# Patient Record
Sex: Male | Born: 1950 | Race: White | Hispanic: No | Marital: Married | Smoking: Former smoker
Health system: Southern US, Community
[De-identification: ages and names within clinical notes are randomized; demographics above are authoritative.]

## PROBLEM LIST (undated history)

## (undated) DIAGNOSIS — E785 Hyperlipidemia, unspecified: Secondary | ICD-10-CM

## (undated) DIAGNOSIS — H269 Unspecified cataract: Secondary | ICD-10-CM

## (undated) DIAGNOSIS — R002 Palpitations: Secondary | ICD-10-CM

## (undated) DIAGNOSIS — I10 Essential (primary) hypertension: Secondary | ICD-10-CM

## (undated) DIAGNOSIS — U071 COVID-19: Secondary | ICD-10-CM

## (undated) DIAGNOSIS — B029 Zoster without complications: Secondary | ICD-10-CM

## (undated) HISTORY — DX: COVID-19: U07.1

## (undated) HISTORY — DX: Essential (primary) hypertension: I10

## (undated) HISTORY — DX: Unspecified cataract: H26.9

## (undated) HISTORY — DX: Hyperlipidemia, unspecified: E78.5

## (undated) HISTORY — PX: CATARACT EXTRACTION: SUR2

## (undated) HISTORY — DX: Zoster without complications: B02.9

## (undated) HISTORY — DX: Palpitations: R00.2

---

## 2007-06-13 ENCOUNTER — Ambulatory Visit: Payer: Self-pay | Admitting: Family Medicine

## 2007-06-18 ENCOUNTER — Ambulatory Visit: Payer: Self-pay | Admitting: Family Medicine

## 2008-08-07 ENCOUNTER — Emergency Department: Payer: Self-pay | Admitting: Emergency Medicine

## 2014-05-28 ENCOUNTER — Observation Stay: Payer: Self-pay | Admitting: Student

## 2014-05-28 LAB — COMPREHENSIVE METABOLIC PANEL
ANION GAP: 8 (ref 7–16)
Albumin: 4 g/dL (ref 3.4–5.0)
Alkaline Phosphatase: 50 U/L
BUN: 13 mg/dL (ref 7–18)
Bilirubin,Total: 0.4 mg/dL (ref 0.2–1.0)
CALCIUM: 8.6 mg/dL (ref 8.5–10.1)
CHLORIDE: 106 mmol/L (ref 98–107)
CREATININE: 1.13 mg/dL (ref 0.60–1.30)
Co2: 25 mmol/L (ref 21–32)
EGFR (Non-African Amer.): >60
GLUCOSE: 115 mg/dL — AB (ref 65–99)
Osmolality: 279 (ref 275–301)
POTASSIUM: 4.4 mmol/L (ref 3.5–5.1)
SGOT(AST): 16 U/L (ref 15–37)
SGPT (ALT): 28 U/L
Sodium: 139 mmol/L (ref 136–145)
TOTAL PROTEIN: 7 g/dL (ref 6.4–8.2)

## 2014-05-28 LAB — APTT: ACTIVATED PTT: 29.2 s (ref 23.6–35.9)

## 2014-05-28 LAB — CBC
HCT: 43.2 % (ref 40.0–52.0)
HGB: 14 g/dL (ref 13.0–18.0)
MCH: 29.9 pg (ref 26.0–34.0)
MCHC: 32.4 g/dL (ref 32.0–36.0)
MCV: 92 fL (ref 80–100)
PLATELETS: 249 10*3/uL (ref 150–440)
RBC: 4.69 10*6/uL (ref 4.40–5.90)
RDW: 12.8 % (ref 11.5–14.5)
WBC: 6.8 10*3/uL (ref 3.8–10.6)

## 2014-05-28 LAB — PROTIME-INR
INR: 1.1
PROTHROMBIN TIME: 13.9 s (ref 11.5–14.7)

## 2014-05-28 LAB — TROPONIN I

## 2014-05-29 DIAGNOSIS — I361 Nonrheumatic tricuspid (valve) insufficiency: Secondary | ICD-10-CM

## 2014-09-12 NOTE — H&P (Signed)
PATIENT NAME:  Pedro, Swanson MR#:  [id number] DATE OF BIRTH:  1950-07-19  DATE OF ADMISSION:  05/28/2014  PRIMARY CARE PHYSICIAN:  Dr. Rosanna Swanson.   EMERGENCY ROOM PHYSICIAN:  Dr. Edd Swanson.    CHIEF COMPLAINT:  Dizziness, some slurred speech.   HISTORY OF PRESENTING ILLNESS:  A 64 year old Caucasian male patient with history of hypertension, hyperlipidemia, past tobacco abuse, presents to the Emergency Room complaining of acute onset of dizziness with room spinning sensation along with some slurred speech. The patient woke up early in the morning at 4:30 and started having significant dizziness. There were no aggravating or relieving factors, but he did feel better when he laid down and closed to his eyes. The patient tried to walk, had to hold onto his walls or railings on the site. Did not have any fall. Had some blurred vision which has resolved. His dizziness is resolved at this point. The patient waited to see if his symptoms would get better, but presented to the Emergency Room after his symptoms did not improve at 9:30 for over 5 hours. His friend noticed that the patient had slurred speech over the phone when he called him for help. He does not have any focal numbness or weakness. No dysphagia. No hearing loss. Has had some ringing in his ears for a while.   Presently the patient feels back to baseline other than feeling a little lightheaded, some mild headache. He had some nausea, but no vomiting. No chest pain, palpitations, abdominal pain, incontinence.   PAST MEDICAL HISTORY:  1.  Hypertension.  2.  Hyperlipidemia.  3.  Past tobacco abuse, quit 3 years back.   FAMILY HISTORY: Her parents were healthy. Mother had hip fracture.   SOCIAL HISTORY: The patient smoked for a long time, but quit 3 years back. He drinks a Bloody Mary and 2-3 beers a day. Did not drink any more alcohol or no alcohol at night yesterday.   CODE STATUS: Full code.   ALLERGIES: No known drug allergies.   HOME  MEDICATIONS:  1. Simvastatin 40 mg daily.  2. Amlodipine 5 mg daily.  3. Valsartan 320 mg daily.   REVIEW OF SYSTEMS:    CONSTITUTIONAL: Complains of some fatigue.  EYES:  Had some blurred vision, but no pain or redness.  EARS, NOSE, AND THROAT: Has some on and off tinnitus and dizziness.  CARDIOVASCULAR: No chest pain, orthopnea.  RESPIRATORY: No shortness of breath, cough.  GASTROINTESTINAL: Had some nausea but no vomiting, diarrhea, abdominal pain.   GENITOURINARY:  No dysuria, hematuria, frequency.  ENDOCRINE: No polyuria, nocturia, thyroid problems.  HEMATOLOGIC AND LYMPHATIC: No anemia, easy bruising, bleeding.  INTEGUMENTARY: No acne, rash, lesion.  MUSCULOSKELETAL: No back pain, arthritis.  NEUROLOGIC:  Has some dizziness, but no focal numbness, weakness. Had some slurred speech earlier.   PHYSICAL EXAMINATION:  VITAL SIGNS: Temperature 98.7, pulse of 89, blood pressure 129/94, saturating 100% on room air.  GENERAL: Obese Caucasian male patient sitting at the side of the bed, overall seems comfortable, conversational.  PSYCHIATRIC: Alert and oriented x 3, pleasant.   HEENT:  Atraumatic, normocephalic.  Oral mucosa moist and pink. External ears and nose normal.  No pallor. No icterus. Pupils bilaterally equal and reactive to light.  NECK: Supple. No thyromegaly. No palpable lymph nodes. Trachea midline. No carotid bruit, JVD.  CARDIOVASCULAR: S1, S2, without any murmurs. Peripheral pulses 2 +. Has 1 + edema.  RESPIRATORY: Normal work of breathing. Clear to auscultation on both sides.  GASTROINTESTINAL:  Soft abdomen, nontender. Bowel sounds present. No hepatosplenomegaly palpable.  GENITOURINARY: No CVA tenderness or bladder distention.  SKIN: Warm and dry. No petechiae, rash, ulcers.  MUSCULOSKELETAL: No joint swelling, redness, effusion of large joints. Normal muscle tone.   NEUROLOGIC:  Motor strength 5/5 in upper and lower extremities. Sensation is intact all over. Gait  normal.    LABORATORY STUDIES: Troponin less than 0.02. LFTs normal. BUN 13, creatinine 1.13 with sodium 130 and potassium 4.4. WBC 6.8, hemoglobin 14, platelets of 249,000. INR 1.1.   EKG shows normal sinus rhythm, nothing acute.   CT scan of the head shows mild atrophy, no acute stroke, mass.   Chest x-ray is normal.   ASSESSMENT AND PLAN:  1.  Acute dizziness. This sounds typical for vertigo in this patient. But there is some concern regarding some slurred speech although this is not confirmed. His blurred vision was transient and happened mainly during the room spinning sensation, but will need to rule out if this is a transient ischemic attack or stroke. We will admit the patient under observation, put him on a telemetry floor. Start him on aspirin and statin. Get an MRI of the brain along with carotid Dopplers and echocardiogram. We will also add meclizine p.r.n. If the MRI is normal we will treat this as vertigo. Consult physical therapy. Fall precautions.  2.  Hypertension. Continue medications.  3.  Deep vein thrombosis prophylaxis with Lovenox.   CODE STATUS: Full code.   TIME SPENT TODAY ON THIS CASE: 40 minutes.     ____________________________ Pedro Alf Darika Ildefonso, MD srs:bu D: 05/28/2014 13:37:30 ET T: 05/28/2014 14:00:20 ET JOB#: [id number]  cc: Pedro Heimlich R. Daveah Varone, MD, <Dictator> Pedro L. Pedro Randy, MD Pedro Carp MD ELECTRONICALLY SIGNED 05/30/2014 18:47

## 2014-09-12 NOTE — Discharge Summary (Signed)
PATIENT NAME:  Pedro Swanson, Pedro Swanson MR#:  [id number] DATE OF BIRTH:  1950-06-18  DATE OF ADMISSION:  05/28/2014 DATE OF DISCHARGE:  05/29/2014  ADMITTING PHYSICIAN: Srikar R. Sudini, MD   DISCHARGING PHYSICIAN: Gladstone Lighter, MD   PRIMARY CARE PHYSICIAN: Richard L. Rosanna Randy, MD   DISCHARGE DIAGNOSES:  1.  Dizziness, likely vertigo from inner ear issues/benign positional vertigo.  2.  Hypertension.  3.  Hyperlipidemia.   DISCHARGE HOME MEDICATIONS:  1.  Meclizine 25 mg p.o. q. 8 hours p.r.n. for dizziness.  2.  Simvastatin 40 mg p.o. daily.  3.  Norvasc 5 mg p.o. daily.  4.  Valsartan 320 mg p.o. daily.    DISCHARGE DIET: Low-sodium diet.   DISCHARGE ACTIVITY: As tolerated.   FOLLOWUP INSTRUCTIONS:  ENT followup with Dr. Tami Ribas in 1 week.  2. PCP followup in 2 weeks.   LABORATORY DATA AND IMAGING STUDIES PRIOR TO DISCHARGE: WBC 6.8, hemoglobin 14.0, hematocrit 43.2, platelet count 249,000.   Sodium 139, potassium 4.4, chloride 106, bicarbonate 25, BUN 13 creatinine 1.1, glucose 111, and calcium of 8.6.   ALT 28, AST 16, alkaline phosphatase 50, total bilirubin 0.4, albumin of 4.0.   CT of the head without contrast showing mild diffuse cortical atrophy. No acute intracranial abnormality. Chest x-ray showing no acute cardiopulmonary process. Carotid Doppler showing mild internal carotid artery plaque bilaterally but less than 50% stenosis.   MRI of the brain without contrast showing atrophy, small vessel ischemic changes. No acute intracranial findings.  BRIEF HOSPITAL COURSE: Pedro Swanson is a 64 year old Caucasian male with past medical history significant for hypertension, hyperlipidemia, very healthy, never been hospitalized before, admitted to the hospital secondary to sudden onset of vertigo.  1.  Vertigo: He had significant dizziness that started yesterday morning. He had a similar episode about a year ago, which self-resolved at the time. He was admitted to make sure this  was not a cerebellar or posterior brain infarct. MRI of the brain negative. Dopplers did not show any significant stenosis. Echo is pending at this time. The patient does have some nystagmus. His symptoms appear mostly position-related; could be an inner ear problem. Started on meclizine with some improvement and is being discharged on meclizine with outpatient ENT followup. Dr. Tamala Swanson was briefly contacted over the phone. He had not had a chance to look over the patient's MRI or anything, but based on his symptoms, MRI findings, he recommended ENT followup as well. Continue his statin at this time.  2.  Hypertension. His home medications are being continued.  3.  Hyperlipidemia, on statin.  4.  His course has been otherwise uneventful in the hospital.   DISCHARGE CONDITION: Stable.   DISCHARGE DISPOSITION: Home.   TIME SPENT ON DISCHARGE: 40 minutes.    ____________________________ Gladstone Lighter, MD rk:ts D: 05/29/2014 12:44:00 ET T: 05/29/2014 23:50:50 ET JOB#: [id number]  cc: Gladstone Lighter, MD, <Dictator> Richard L. Rosanna Randy, MD Gladstone Lighter MD ELECTRONICALLY SIGNED 05/31/2014 16:18

## 2014-10-04 LAB — LIPID PANEL
Cholesterol: 172 mg/dL (ref 0–200)
HDL: 59 mg/dL (ref 35–70)
LDL CALC: 94 mg/dL
LDl/HDL Ratio: 1.6
Triglycerides: 94 mg/dL (ref 40–160)

## 2014-10-04 LAB — TSH: TSH: 1.76 u[IU]/mL (ref <=5.90)

## 2015-01-18 ENCOUNTER — Encounter: Payer: Self-pay | Admitting: Family Medicine

## 2015-01-18 ENCOUNTER — Ambulatory Visit (INDEPENDENT_AMBULATORY_CARE_PROVIDER_SITE_OTHER): Payer: No Typology Code available for payment source | Admitting: Family Medicine

## 2015-01-18 VITALS — BP 122/62 | HR 72 | Resp 16 | Ht 69.0 in | Wt 178.0 lb

## 2015-01-18 DIAGNOSIS — K219 Gastro-esophageal reflux disease without esophagitis: Secondary | ICD-10-CM

## 2015-01-18 DIAGNOSIS — E559 Vitamin D deficiency, unspecified: Secondary | ICD-10-CM | POA: Insufficient documentation

## 2015-01-18 DIAGNOSIS — A048 Other specified bacterial intestinal infections: Secondary | ICD-10-CM

## 2015-01-18 DIAGNOSIS — I10 Essential (primary) hypertension: Secondary | ICD-10-CM | POA: Insufficient documentation

## 2015-01-18 DIAGNOSIS — H9110 Presbycusis, unspecified ear: Secondary | ICD-10-CM | POA: Insufficient documentation

## 2015-01-18 DIAGNOSIS — R079 Chest pain, unspecified: Secondary | ICD-10-CM

## 2015-01-18 DIAGNOSIS — F17200 Nicotine dependence, unspecified, uncomplicated: Secondary | ICD-10-CM | POA: Insufficient documentation

## 2015-01-18 DIAGNOSIS — M1990 Unspecified osteoarthritis, unspecified site: Secondary | ICD-10-CM | POA: Insufficient documentation

## 2015-01-18 DIAGNOSIS — B9681 Helicobacter pylori [H. pylori] as the cause of diseases classified elsewhere: Secondary | ICD-10-CM

## 2015-01-18 DIAGNOSIS — N400 Enlarged prostate without lower urinary tract symptoms: Secondary | ICD-10-CM | POA: Insufficient documentation

## 2015-01-18 DIAGNOSIS — E7800 Pure hypercholesterolemia, unspecified: Secondary | ICD-10-CM | POA: Insufficient documentation

## 2015-01-18 MED ORDER — OMEPRAZOLE 20 MG PO CPDR: 20.0000 mg | DELAYED_RELEASE_CAPSULE | Freq: Every day | ORAL | Status: DC

## 2015-01-18 NOTE — Progress Notes (Signed)
Patient ID: Pedro Swanson, male   DOB: [birth year], 64 y.o.   MRN: [id number]   Pedro Swanson  MRN: [id number] DOB: Aug 24, 1950  Subjective:  Chest Pain  This is a new problem. The current episode started in the past 7 days. The onset quality is sudden. The problem occurs intermittently. The problem has been waxing and waning. The pain is mild. The quality of the pain is described as pressure. The pain does not radiate. Associated symptoms include shortness of breath. Pertinent negatives include no abdominal pain, back pain, dizziness, fever, headaches, lower extremity edema, nausea, numbness or vomiting. The pain is aggravated by nothing. He has tried antacids for the symptoms. The treatment provided no relief.    Patient Active Problem List   Diagnosis Date Noted  . 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 Hypercholesteremia 01/18/2015  . BP (high blood pressure) 01/18/2015  . Osteoarthrosis 01/18/2015  . 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 Compulsive tobacco user syndrome 01/18/2015  . Avitaminosis D 01/18/2015    No past medical history on file.  Social History   Social History  . Marital Status: Married    Spouse Name: N/A  . Number of Children: N/A  . Years of Education: N/A   Occupational History  . Not on file.   Social History Main Topics  . Smoking status: Former Research scientist (life sciences)  . Smokeless tobacco: Never Used  . Alcohol Use: No  . Drug Use: No  . Sexual Activity: Not on file   Other Topics Concern  . Not on file   Social History Narrative    Outpatient Prescriptions Prior to Visit  Medication Sig Dispense Refill  . amLODipine (NORVASC) 5 MG tablet Take by mouth.    . cholecalciferol (VITAMIN D) 1000 UNITS tablet Take by mouth.    . simvastatin (ZOCOR) 40 MG tablet Take by mouth.    . valsartan (DIOVAN) 320 MG tablet Take by mouth.     No facility-administered medications prior to visit.    No Known Allergies  Review of Systems   Constitutional: Negative.  Negative for fever.  Respiratory: Positive for shortness of breath.   Cardiovascular: Positive for chest pain.  Gastrointestinal: Positive for heartburn. Negative for nausea, vomiting, abdominal pain, diarrhea, constipation, blood in stool and melena.  Musculoskeletal: Negative for back pain.  Neurological: Negative for dizziness, numbness and headaches.  Psychiatric/Behavioral: Negative.    Objective:  BP 122/62 mmHg  Pulse 72  Resp 16  Ht 5\' 9"  (1.753 m)  Wt 178 lb (80.74 kg)  BMI 26.27 kg/m2  Physical Exam  Constitutional: He is oriented to person, place, and time and well-developed, well-nourished, and in no distress.  HENT:  Head: Normocephalic and atraumatic.  Right Ear: External ear normal.  Left Ear: External ear normal.  Nose: Nose normal.  Mouth/Throat: Oropharynx is clear and moist.  Eyes: Conjunctivae are normal.  Neck: Neck supple.  Cardiovascular: Normal rate, regular rhythm and normal heart sounds.   Pulmonary/Chest: Effort normal.  Abdominal: Soft.  Neurological: He is alert and oriented to person, place, and time. Gait normal.  Skin: Skin is warm and dry.  Psychiatric: Mood, memory, affect and judgment normal.    Assessment and Plan :  Chest pain, unspecified chest pain type - Plan: EKG 12-Lead  Cardiology referral. ASA 81mg  daily.Rx as GERD with Omeprazole --RTC 2 months 2.GERD 3.HTN 4.BPH  Miguel Aschoff MD [facility] 01/18/2015 3:51 PM

## 2015-01-19 ENCOUNTER — Telehealth: Payer: Self-pay | Admitting: Family Medicine

## 2015-01-19 MED ORDER — OMEPRAZOLE 20 MG PO CPDR: 20.0000 mg | DELAYED_RELEASE_CAPSULE | Freq: Every day | ORAL | Status: DC

## 2015-01-19 NOTE — Telephone Encounter (Signed)
Pt's wife states that when pt went to [facility] to pick up prescription for prilosec it had not been called in

## 2015-01-20 LAB — COMPREHENSIVE METABOLIC PANEL
A/G RATIO: 2 (ref 1.1–2.5)
ALBUMIN: 4.9 g/dL — AB (ref 3.6–4.8)
ALT: 15 IU/L (ref 0–44)
AST: 16 IU/L (ref 0–40)
Alkaline Phosphatase: 49 IU/L (ref 39–117)
BILIRUBIN TOTAL: 0.2 mg/dL (ref 0.0–1.2)
BUN / CREAT RATIO: 18 (ref 10–22)
BUN: 21 mg/dL (ref 8–27)
CHLORIDE: 102 mmol/L (ref 97–108)
CO2: 22 mmol/L (ref 18–29)
Calcium: 9.8 mg/dL (ref 8.6–10.2)
Creatinine, Ser: 1.2 mg/dL (ref 0.76–1.27)
GFR calc non Af Amer: 63 mL/min/{1.73_m2} (ref >59)
GFR, EST AFRICAN AMERICAN: 72 mL/min/{1.73_m2} (ref >59)
Globulin, Total: 2.5 g/dL (ref 1.5–4.5)
Glucose: 111 mg/dL — ABNORMAL HIGH (ref 65–99)
POTASSIUM: 4.9 mmol/L (ref 3.5–5.2)
SODIUM: 146 mmol/L — AB (ref 134–144)
TOTAL PROTEIN: 7.4 g/dL (ref 6.0–8.5)

## 2015-01-20 LAB — H PYLORI, IGM, IGG, IGA AB
H Pylori IgG: <0.9 U/mL (ref 0.0–0.8)
H pylori, IgM Abs: <9 units (ref 0.0–8.9)
H. pylori, IgA Abs: 21.8 units — ABNORMAL HIGH (ref 0.0–8.9)

## 2015-01-20 LAB — CBC WITH DIFFERENTIAL/PLATELET
BASOS: 0 %
Basophils Absolute: 0 10*3/uL (ref 0.0–0.2)
EOS (ABSOLUTE): 0 10*3/uL (ref 0.0–0.4)
Eos: 0 %
Hematocrit: 42.9 % (ref 37.5–51.0)
Hemoglobin: 14.6 g/dL (ref 12.6–17.7)
IMMATURE GRANS (ABS): 0 10*3/uL (ref 0.0–0.1)
Immature Granulocytes: 0 %
LYMPHS: 28 %
Lymphocytes Absolute: 2.2 10*3/uL (ref 0.7–3.1)
MCH: 30.5 pg (ref 26.6–33.0)
MCHC: 34 g/dL (ref 31.5–35.7)
MCV: 90 fL (ref 79–97)
Monocytes Absolute: 0.7 10*3/uL (ref 0.1–0.9)
Monocytes: 9 %
NEUTROS ABS: 5.1 10*3/uL (ref 1.4–7.0)
Neutrophils: 63 %
PLATELETS: 260 10*3/uL (ref 150–379)
RBC: 4.79 x10E6/uL (ref 4.14–5.80)
RDW: 13.2 % (ref 12.3–15.4)
WBC: 8 10*3/uL (ref 3.4–10.8)

## 2015-01-20 MED ORDER — OMEPRAZOLE 20 MG PO CPDR: 20.0000 mg | DELAYED_RELEASE_CAPSULE | Freq: Every day | ORAL | Status: DC

## 2015-01-20 NOTE — Telephone Encounter (Signed)
Please make sure it is sent in.

## 2015-01-20 NOTE — Telephone Encounter (Signed)
Done-aa 

## 2015-01-25 NOTE — Addendum Note (Signed)
Addended by: Arnette Norris on: 01/25/2015 09:52 AM   Modules accepted: Orders

## 2015-02-02 ENCOUNTER — Ambulatory Visit: Payer: No Typology Code available for payment source | Admitting: Family Medicine

## 2015-03-03 ENCOUNTER — Other Ambulatory Visit: Payer: Self-pay | Admitting: Family Medicine

## 2015-04-04 ENCOUNTER — Other Ambulatory Visit: Payer: Self-pay | Admitting: Family Medicine

## 2015-04-04 ENCOUNTER — Encounter: Payer: Self-pay | Admitting: Family Medicine

## 2015-04-04 ENCOUNTER — Ambulatory Visit (INDEPENDENT_AMBULATORY_CARE_PROVIDER_SITE_OTHER): Payer: No Typology Code available for payment source | Admitting: Family Medicine

## 2015-04-04 VITALS — BP 128/72 | HR 66 | Temp 97.9°F | Resp 16 | Wt 188.0 lb

## 2015-04-04 DIAGNOSIS — E7800 Pure hypercholesterolemia, unspecified: Secondary | ICD-10-CM | POA: Diagnosis not present

## 2015-04-04 DIAGNOSIS — R079 Chest pain, unspecified: Secondary | ICD-10-CM | POA: Diagnosis not present

## 2015-04-04 DIAGNOSIS — K219 Gastro-esophageal reflux disease without esophagitis: Secondary | ICD-10-CM

## 2015-04-04 DIAGNOSIS — I10 Essential (primary) hypertension: Secondary | ICD-10-CM

## 2015-04-04 NOTE — Progress Notes (Signed)
Patient ID: Pedro Swanson, male   DOB: [birth year], 64 y.o.   MRN: [id number]    Subjective:  HPI  Hypertension, follow-up:  BP Readings from Last 3 Encounters:  04/04/15 128/72  01/18/15 122/62  09/27/14 122/68    He was last seen for hypertension 2 months ago.  BP at that visit was 122/62. Management since that visit includes none. He reports good compliance with treatment. He is not having side effects.  He is not exercising, but is very active.   Outside blood pressures are not being checked. He is experiencing none.  Patient denies chest pain, chest pressure/discomfort, dyspnea, exertional chest pressure/discomfort, fatigue, irregular heart beat, lower extremity edema and palpitations.   Cardiovascular risk factors include advanced age (older than 8 for men, 80 for women), dyslipidemia, hypertension and male gender.    Wt Readings from Last 3 Encounters:  04/04/15 188 lb (85.276 kg)  01/18/15 178 lb (80.74 kg)  09/27/14 185 lb (83.915 kg)   ------------------------------------------------------------------------  Pt reports that his chest pain has resolved. He had a stress test with Dr. Nehemiah Massed and he said everything checked out fine. He is not taking Omeprazole.     Prior to Admission medications   Medication Sig Start Date End Date Taking? Authorizing Provider  amLODipine (NORVASC) 5 MG tablet TAKE ONE (1) TABLET EACH DAY 03/03/15  Yes Richard Maceo Pro., MD  simvastatin (ZOCOR) 40 MG tablet Take by mouth. 01/29/14  Yes Historical Provider, MD  valsartan (DIOVAN) 320 MG tablet TAKE ONE (1) TABLET EACH DAY 03/03/15  Yes Richard Maceo Pro., MD  cholecalciferol (VITAMIN D) 1000 UNITS tablet Take by mouth. 03/11/12   Historical Provider, MD  omeprazole (PRILOSEC) 20 MG capsule Take 1 capsule (20 mg total) by mouth daily. Patient not taking: Reported on 04/04/2015 01/20/15   Jerrol Banana., MD    Patient Active Problem List   Diagnosis Date Noted  .  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 Hypercholesteremia 01/18/2015  . BP (high blood pressure) 01/18/2015  . Osteoarthrosis 01/18/2015  . 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 Compulsive tobacco user syndrome 01/18/2015  . Avitaminosis D 01/18/2015    History reviewed. No pertinent past medical history.  Social History   Social History  . Marital Status: Married    Spouse Name: N/A  . Number of Children: N/A  . Years of Education: N/A   Occupational History  . Not on file.   Social History Main Topics  . Smoking status: Former Research scientist (life sciences)  . Smokeless tobacco: Never Used  . Alcohol Use: No  . Drug Use: No  . Sexual Activity: Not on file   Other Topics Concern  . Not on file   Social History Narrative    No Known Allergies  Review of Systems  Constitutional: Negative.   HENT: Negative.   Respiratory: Negative.   Cardiovascular: Negative.   Gastrointestinal: Negative.   Genitourinary: Negative.   Musculoskeletal: Negative.   Skin: Negative.   Neurological: Negative.   Endo/Heme/Allergies: Negative.   Psychiatric/Behavioral: Negative.     Immunization History  Administered Date(s) Administered  . Tdap 03/31/2008   Objective:  BP 128/72 mmHg  Pulse 66  Temp(Src) 97.9 F (36.6 C) (Oral)  Resp 16  Wt 188 lb (85.276 kg)  Physical Exam  Constitutional: He is oriented to person, place, and time and well-developed, well-nourished, and in no distress.  HENT:  Head: Normocephalic and atraumatic.  Right Ear: External ear normal.  Left Ear:  External ear normal.  Nose: Nose normal.  Eyes: Conjunctivae and EOM are normal. Pupils are equal, round, and reactive to light.  Neck: Normal range of motion. Neck supple.  Cardiovascular: Normal rate, regular rhythm, normal heart sounds and intact distal pulses.   Pulmonary/Chest: Effort normal and breath sounds normal.  Abdominal: Soft. Bowel sounds are normal.  Musculoskeletal: Normal range of motion.    Neurological: He is alert and oriented to person, place, and time. He has normal reflexes. Gait normal. GCS score is 15.  Skin: Skin is warm and dry.  Psychiatric: Mood, memory, affect and judgment normal.    Lab Results  Component Value Date   WBC 8.0 01/18/2015   HGB 14.0 05/28/2014   HCT 42.9 01/18/2015   PLT 249 05/28/2014   GLUCOSE 111* 01/18/2015   CHOL 172 10/04/2014   TRIG 94 10/04/2014   HDL 59 10/04/2014   LDLCALC 94 10/04/2014   TSH 1.76 10/04/2014   INR 1.1 05/28/2014    CMP     Component Value Date/Time   NA 146* 01/18/2015 1656   NA 139 05/28/2014 1115   K 4.9 01/18/2015 1656   K 4.4 05/28/2014 1115   CL 102 01/18/2015 1656   CL 106 05/28/2014 1115   CO2 22 01/18/2015 1656   CO2 25 05/28/2014 1115   GLUCOSE 111* 01/18/2015 1656   GLUCOSE 115* 05/28/2014 1115   BUN 21 01/18/2015 1656   BUN 13 05/28/2014 1115   CREATININE 1.20 01/18/2015 1656   CREATININE 1.13 05/28/2014 1115   CALCIUM 9.8 01/18/2015 1656   CALCIUM 8.6 05/28/2014 1115   PROT 7.4 01/18/2015 1656   PROT 7.0 05/28/2014 1115   ALBUMIN 4.9* 01/18/2015 1656   ALBUMIN 4.0 05/28/2014 1115   AST 16 01/18/2015 1656   AST 16 05/28/2014 1115   ALT 15 01/18/2015 1656   ALT 28 05/28/2014 1115   ALKPHOS 49 01/18/2015 1656   ALKPHOS 50 05/28/2014 1115   BILITOT 0.2 01/18/2015 1656   BILITOT 0.4 05/28/2014 1115   GFRNONAA 63 01/18/2015 1656   GFRNONAA >60 05/28/2014 1115   GFRAA 72 01/18/2015 1656   GFRAA >60 05/28/2014 1115    Assessment and Plan :   1. Essential hypertension Stable  2. Hypercholesteremia stable  3. Chest pain, unspecified chest pain type Resolved. Normal stress test per Dr. Nehemiah Massed.  4. Gastroesophageal reflux disease, esophagitis presence not specified Stable off of Omeprazole. Per GI, stopped omeprazole. Followed by GI. RTC 6 months. Patient was seen and examined by Dr. Miguel Aschoff, and noted scribed by Webb Laws, [place] MD [facility] 04/04/2015 8:13 AM

## 2015-05-26 DIAGNOSIS — Z0120 Encounter for dental examination and cleaning without abnormal findings: Secondary | ICD-10-CM | POA: Diagnosis not present

## 2015-06-06 ENCOUNTER — Other Ambulatory Visit: Payer: Self-pay | Admitting: Family Medicine

## 2015-06-06 DIAGNOSIS — I10 Essential (primary) hypertension: Secondary | ICD-10-CM

## 2015-06-21 ENCOUNTER — Other Ambulatory Visit: Payer: Self-pay

## 2015-06-21 MED ORDER — SIMVASTATIN 20 MG PO TABS: 20.0000 mg | ORAL_TABLET | Freq: Every day | ORAL | Status: DC

## 2015-07-11 DIAGNOSIS — Z0120 Encounter for dental examination and cleaning without abnormal findings: Secondary | ICD-10-CM | POA: Diagnosis not present

## 2015-08-22 DIAGNOSIS — I10 Essential (primary) hypertension: Secondary | ICD-10-CM | POA: Diagnosis not present

## 2015-08-22 DIAGNOSIS — E782 Mixed hyperlipidemia: Secondary | ICD-10-CM | POA: Diagnosis not present

## 2015-10-04 ENCOUNTER — Ambulatory Visit: Payer: No Typology Code available for payment source | Admitting: Family Medicine

## 2015-11-07 ENCOUNTER — Ambulatory Visit: Payer: No Typology Code available for payment source | Admitting: Family Medicine

## 2015-11-10 ENCOUNTER — Other Ambulatory Visit: Payer: Self-pay

## 2015-11-10 MED ORDER — SIMVASTATIN 20 MG PO TABS: 20.0000 mg | ORAL_TABLET | Freq: Every day | ORAL | Status: DC

## 2015-11-14 DIAGNOSIS — I10 Essential (primary) hypertension: Secondary | ICD-10-CM | POA: Diagnosis not present

## 2015-11-14 DIAGNOSIS — M62830 Muscle spasm of back: Secondary | ICD-10-CM | POA: Diagnosis not present

## 2015-12-07 ENCOUNTER — Encounter: Payer: Self-pay | Admitting: Family Medicine

## 2015-12-07 ENCOUNTER — Ambulatory Visit (INDEPENDENT_AMBULATORY_CARE_PROVIDER_SITE_OTHER): Payer: Medicare HMO | Admitting: Family Medicine

## 2015-12-07 VITALS — BP 120/68 | HR 72 | Temp 98.1°F | Resp 16 | Ht 69.0 in | Wt 191.0 lb

## 2015-12-07 DIAGNOSIS — I10 Essential (primary) hypertension: Secondary | ICD-10-CM

## 2015-12-07 DIAGNOSIS — E7800 Pure hypercholesterolemia, unspecified: Secondary | ICD-10-CM | POA: Diagnosis not present

## 2015-12-07 NOTE — Progress Notes (Signed)
       Patient: Pedro Swanson Male    DOB: [birth year]   65 y.o.   MRN: [id number] Visit Date: 12/07/2015  Today's Provider: Wilhemena Durie, MD   Chief Complaint  Patient presents with  . Follow-up  . Hypertension  . Hyperlipidemia  . Gastroesophageal Reflux   Subjective:    HPI  Essential hypertension: From 04/04/15-Stable  Hypercholesteremia: From 04/04/15-stable  Gastroesophageal reflux disease, esophagitis presence not specified: From 04/04/15-Stable off of Omeprazole. Per GI, stopped omeprazole. Followed by GI. RTC 6 months.   No Known Allergies Current Meds  Medication Sig  . amLODipine (NORVASC) 5 MG tablet TAKE ONE (1) TABLET EACH DAY  . methocarbamol (ROBAXIN) 500 MG tablet Take 500 mg by mouth every 6 (six) hours as needed.  . simvastatin (ZOCOR) 20 MG tablet Take 1 tablet (20 mg total) by mouth daily.  . valsartan (DIOVAN) 320 MG tablet TAKE ONE (1) TABLET EACH DAY    Review of Systems  Constitutional: Negative for appetite change, chills and fever.  HENT: Negative.   Eyes: Negative.   Respiratory: Negative for chest tightness, shortness of breath and wheezing.   Cardiovascular: Negative for chest pain and palpitations.  Gastrointestinal: Negative for abdominal pain, nausea and vomiting.  Endocrine: Negative.   Musculoskeletal: Positive for back pain.  Allergic/Immunologic: Negative.     Social History  Substance Use Topics  . Smoking status: Former Research scientist (life sciences)  . Smokeless tobacco: Never Used  . Alcohol use No   Objective:   BP 120/68 (BP Location: Left Arm, Patient Position: Sitting, Cuff Size: Large)   Pulse 72   Temp 98.1 F (36.7 C) (Oral)   Resp 16   Ht 5\' 9"  (1.753 m)   Wt 191 lb (86.6 kg)   SpO2 98%   BMI 28.21 kg/m   Physical Exam  Constitutional: He is oriented to person, place, and time. He appears well-developed and well-nourished.  HENT:  Head: Normocephalic and atraumatic.  Right Ear: External ear normal.  Left  Ear: External ear normal.  Nose: Nose normal.  Eyes: Conjunctivae are normal.  Neck: Neck supple.  Cardiovascular: Normal rate, regular rhythm and normal heart sounds.   Pulmonary/Chest: Effort normal and breath sounds normal.  Abdominal: Soft.  Neurological: He is alert and oriented to person, place, and time.  Skin: Skin is warm and dry.  Psychiatric: He has a normal mood and affect. His behavior is normal. Judgment and thought content normal.        Assessment & Plan:     1. Essential hypertension  - Lipid panel - TSH - Comprehensive metabolic panel - CBC w/Diff/Platelet  2. Hypercholesteremia - Lipid panel - TSH - Comprehensive metabolic panel - CBC w/Diff/Platelet      I have done the exam and reviewed the above chart and it is accurate to the best of my knowledge.  Richard Cranford Mon, MD  [facility]

## 2015-12-07 NOTE — Patient Instructions (Signed)
Labs ordered  TSH CBC with diff Lipids CMET  Return office visit in 6 months for AWE.

## 2015-12-08 DIAGNOSIS — E7800 Pure hypercholesterolemia, unspecified: Secondary | ICD-10-CM | POA: Diagnosis not present

## 2015-12-08 DIAGNOSIS — I10 Essential (primary) hypertension: Secondary | ICD-10-CM | POA: Diagnosis not present

## 2015-12-09 LAB — CBC WITH DIFFERENTIAL/PLATELET
BASOS: 0 %
Basophils Absolute: 0 10*3/uL (ref 0.0–0.2)
EOS (ABSOLUTE): 0 10*3/uL (ref 0.0–0.4)
EOS: 1 %
HEMATOCRIT: 43.6 % (ref 37.5–51.0)
Hemoglobin: 14.8 g/dL (ref 12.6–17.7)
IMMATURE GRANULOCYTES: 0 %
Immature Grans (Abs): 0 10*3/uL (ref 0.0–0.1)
LYMPHS ABS: 2.6 10*3/uL (ref 0.7–3.1)
Lymphs: 43 %
MCH: 30.2 pg (ref 26.6–33.0)
MCHC: 33.9 g/dL (ref 31.5–35.7)
MCV: 89 fL (ref 79–97)
MONOS ABS: 0.5 10*3/uL (ref 0.1–0.9)
Monocytes: 9 %
NEUTROS PCT: 47 %
Neutrophils Absolute: 2.7 10*3/uL (ref 1.4–7.0)
PLATELETS: 259 10*3/uL (ref 150–379)
RBC: 4.9 x10E6/uL (ref 4.14–5.80)
RDW: 13.1 % (ref 12.3–15.4)
WBC: 5.9 10*3/uL (ref 3.4–10.8)

## 2015-12-09 LAB — COMPREHENSIVE METABOLIC PANEL
A/G RATIO: 1.9 (ref 1.2–2.2)
ALBUMIN: 4.4 g/dL (ref 3.6–4.8)
ALK PHOS: 50 IU/L (ref 39–117)
ALT: 17 IU/L (ref 0–44)
AST: 14 IU/L (ref 0–40)
BUN / CREAT RATIO: 16 (ref 10–24)
BUN: 18 mg/dL (ref 8–27)
Bilirubin Total: 0.3 mg/dL (ref 0.0–1.2)
CO2: 24 mmol/L (ref 18–29)
CREATININE: 1.1 mg/dL (ref 0.76–1.27)
Calcium: 9.3 mg/dL (ref 8.6–10.2)
Chloride: 102 mmol/L (ref 96–106)
GFR calc Af Amer: 80 mL/min/{1.73_m2} (ref >59)
GFR, EST NON AFRICAN AMERICAN: 69 mL/min/{1.73_m2} (ref >59)
GLOBULIN, TOTAL: 2.3 g/dL (ref 1.5–4.5)
Glucose: 99 mg/dL (ref 65–99)
POTASSIUM: 4.7 mmol/L (ref 3.5–5.2)
SODIUM: 141 mmol/L (ref 134–144)
Total Protein: 6.7 g/dL (ref 6.0–8.5)

## 2015-12-09 LAB — LIPID PANEL
CHOL/HDL RATIO: 3.9 ratio (ref 0.0–5.0)
CHOLESTEROL TOTAL: 209 mg/dL — AB (ref 100–199)
HDL: 54 mg/dL (ref >39)
LDL CALC: 121 mg/dL — AB (ref 0–99)
TRIGLYCERIDES: 168 mg/dL — AB (ref 0–149)
VLDL Cholesterol Cal: 34 mg/dL (ref 5–40)

## 2015-12-09 LAB — TSH: TSH: 2.8 u[IU]/mL (ref 0.450–4.500)

## 2015-12-21 ENCOUNTER — Other Ambulatory Visit: Payer: Self-pay | Admitting: Family Medicine

## 2015-12-21 DIAGNOSIS — I10 Essential (primary) hypertension: Secondary | ICD-10-CM

## 2016-01-30 ENCOUNTER — Telehealth: Payer: Self-pay | Admitting: Family Medicine

## 2016-01-30 NOTE — Telephone Encounter (Signed)
Pt advised-aa 

## 2016-01-30 NOTE — Telephone Encounter (Signed)
Pt needs results from last labs from a couple weeks ago.  His call back is . [phone]  Thanks, Con Memos

## 2016-03-05 DIAGNOSIS — I10 Essential (primary) hypertension: Secondary | ICD-10-CM | POA: Diagnosis not present

## 2016-03-05 DIAGNOSIS — E782 Mixed hyperlipidemia: Secondary | ICD-10-CM | POA: Diagnosis not present

## 2016-06-12 ENCOUNTER — Ambulatory Visit (INDEPENDENT_AMBULATORY_CARE_PROVIDER_SITE_OTHER): Payer: Medicare HMO | Admitting: Family Medicine

## 2016-06-12 ENCOUNTER — Encounter: Payer: Self-pay | Admitting: Family Medicine

## 2016-06-12 VITALS — BP 136/74 | HR 80 | Temp 98.4°F | Resp 14 | Wt 193.0 lb

## 2016-06-12 DIAGNOSIS — E559 Vitamin D deficiency, unspecified: Secondary | ICD-10-CM | POA: Diagnosis not present

## 2016-06-12 DIAGNOSIS — E7800 Pure hypercholesterolemia, unspecified: Secondary | ICD-10-CM | POA: Diagnosis not present

## 2016-06-12 DIAGNOSIS — I10 Essential (primary) hypertension: Secondary | ICD-10-CM

## 2016-06-12 DIAGNOSIS — Z2821 Immunization not carried out because of patient refusal: Secondary | ICD-10-CM | POA: Diagnosis not present

## 2016-06-12 DIAGNOSIS — Z1211 Encounter for screening for malignant neoplasm of colon: Secondary | ICD-10-CM

## 2016-06-12 DIAGNOSIS — Z5320 Procedure and treatment not carried out because of patient's decision for unspecified reasons: Secondary | ICD-10-CM | POA: Diagnosis not present

## 2016-06-12 NOTE — Progress Notes (Signed)
Pedro Swanson [place]  MRN: [id number] DOB: 06/18/1950  Subjective:  HPI  Patient is here for 6 months follow up. Patient is not checking his b/p. No cardiac symptoms. Gets dizzy sometimes with standing up too fast and is cautious with that. BP Readings from Last 3 Encounters:  06/12/16 136/74  12/07/15 120/68  04/04/15 128/72   Lab Results  Component Value Date   CHOL 209 (H) 12/08/2015   HDL 54 12/08/2015   LDLCALC 121 (H) 12/08/2015   TRIG 168 (H) 12/08/2015   CHOLHDL 3.9 12/08/2015     Patient Active Problem List   Diagnosis Date Noted  . 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 Hypercholesteremia 01/18/2015  . BP (high blood pressure) 01/18/2015  . Osteoarthrosis 01/18/2015  . 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 Compulsive tobacco user syndrome 01/18/2015  . Avitaminosis D 01/18/2015    History reviewed. No pertinent past medical history.  Social History   Social History  . Marital status: Married    Spouse name: N/A  . Number of children: N/A  . Years of education: N/A   Occupational History  . Not on file.   Social History Main Topics  . Smoking status: Former Research scientist (life sciences)  . Smokeless tobacco: Never Used  . Alcohol use No  . Drug use: No  . Sexual activity: Not on file   Other Topics Concern  . Not on file   Social History Narrative  . No narrative on file    Outpatient Encounter Prescriptions as of 06/12/2016  Medication Sig Note  . amLODipine (NORVASC) 5 MG tablet TAKE ONE (1) TABLET EACH DAY   . simvastatin (ZOCOR) 20 MG tablet Take 1 tablet (20 mg total) by mouth daily.   . valsartan (DIOVAN) 320 MG tablet TAKE ONE (1) TABLET EACH DAY   . [DISCONTINUED] methocarbamol (ROBAXIN) 500 MG tablet Take 500 mg by mouth every 6 (six) hours as needed. 12/07/2015: Received from: External Pharmacy Received Sig: TAKE 1 TO 2 TABLETS BY MOUTH EVERY 6 HOURS AS NEEDED   No facility-administered encounter medications on file as of 06/12/2016.     No Known  Allergies  Review of Systems  Constitutional: Negative.   Respiratory: Positive for shortness of breath (at times with exercise a lot).   Cardiovascular: Negative.   Gastrointestinal: Negative.   Musculoskeletal: Negative.  Negative for back pain, falls, joint pain, myalgias and neck pain.  Skin: Negative.   Neurological: Positive for dizziness (with standing up, at times).  Endo/Heme/Allergies: Negative.   Psychiatric/Behavioral: Negative.     Objective:  BP 136/74   Pulse 80   Temp 98.4 F (36.9 C)   Resp 14   Wt 193 lb (87.5 kg)   BMI 28.50 kg/m   Physical Exam  Constitutional: He is oriented to person, place, and time and well-developed, well-nourished, and in no distress.  HENT:  Head: Normocephalic and atraumatic.  Cardiovascular: Normal rate, regular rhythm, normal heart sounds and intact distal pulses.   No murmur heard. Pulmonary/Chest: Effort normal and breath sounds normal. No respiratory distress. He has no wheezes.  Musculoskeletal: He exhibits edema (trace).  Neurological: He is alert and oriented to person, place, and time.  Psychiatric: Mood, memory, affect and judgment normal.    Assessment and Plan :  1. Essential hypertension Stable. Continue current medication. He is seen Dr Nehemiah Massed once a year. 2. Hypercholesteremia Will re check labs on the next visit. If levels are still elevated will switch Simvastatin to Rosuvastatin due to  he is on Amlodipine and due to this can not increase Simvastatin dose anymore at this time. Continue staying active. Discussed with patient about trying oatmeal for 6 months. 3. Avitaminosis D Could not tolerate Vitamin D supplement-gave him constipation. Will check this level on the next visit too. Last check was 2015 and level was 22.5.  4. Influenza vaccination declined by patient Patient encouraged to get this immunization.  5. Pneumococcal vaccination declined by patient Patient encouraged to get this  immunization.  6. Colonoscopy refused Patient has always declined colonoscopy. Patient agrees to get cologuard done. HPI, Exam and A&P transcribed under direction and in the presence of Miguel Aschoff, MD. I have done the exam and reviewed the chart and it is accurate to the best of my knowledge. Development worker, community has been used and  any errors in dictation or transcription are unintentional. Miguel Aschoff M.D. [facility]

## 2016-06-27 ENCOUNTER — Telehealth: Payer: Self-pay | Admitting: Family Medicine

## 2016-06-27 NOTE — Telephone Encounter (Signed)
Order for cologuard faxed to [facility] °

## 2016-07-09 ENCOUNTER — Other Ambulatory Visit: Payer: Self-pay | Admitting: Family Medicine

## 2016-07-30 ENCOUNTER — Ambulatory Visit (INDEPENDENT_AMBULATORY_CARE_PROVIDER_SITE_OTHER): Payer: Medicare HMO | Admitting: Family Medicine

## 2016-07-30 VITALS — BP 156/64 | HR 80 | Temp 97.7°F | Resp 16 | Wt 194.0 lb

## 2016-07-30 DIAGNOSIS — K602 Anal fissure, unspecified: Secondary | ICD-10-CM

## 2016-07-30 DIAGNOSIS — Z1211 Encounter for screening for malignant neoplasm of colon: Secondary | ICD-10-CM | POA: Diagnosis not present

## 2016-07-30 NOTE — Progress Notes (Signed)
   Pedro Swanson  MRN: [id number] DOB: December 20, 1950  Subjective:  HPI   The patient is a 66 year old male who presents for 'consult with the doctor".  He does not want to disclose the reason for his visit to me.  Patient Active Problem List   Diagnosis Date Noted  . 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 Hypercholesteremia 01/18/2015  . BP (high blood pressure) 01/18/2015  . Osteoarthrosis 01/18/2015  . 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 Compulsive tobacco user syndrome 01/18/2015  . Avitaminosis D 01/18/2015    No past medical history on file.  Social History   Social History  . Marital status: Married    Spouse name: N/A  . Number of children: N/A  . Years of education: N/A   Occupational History  . Not on file.   Social History Main Topics  . Smoking status: Former Research scientist (life sciences)  . Smokeless tobacco: Never Used  . Alcohol use No  . Drug use: No  . Sexual activity: Not on file   Other Topics Concern  . Not on file   Social History Narrative  . No narrative on file    Outpatient Encounter Prescriptions as of 07/30/2016  Medication Sig  . amLODipine (NORVASC) 5 MG tablet TAKE ONE (1) TABLET EACH DAY  . simvastatin (ZOCOR) 20 MG tablet TAKE ONE TABLET BY MOUTH DAILY  . valsartan (DIOVAN) 320 MG tablet TAKE ONE (1) TABLET EACH DAY   No facility-administered encounter medications on file as of 07/30/2016.     No Known Allergies  Review of Systems  Gastrointestinal: Positive for blood in stool (blood on toilet paper only).    Objective:  BP (!) 156/64 (BP Location: Right Arm, Patient Position: Sitting, Cuff Size: Normal)   Pulse 80   Temp 97.7 F (36.5 C) (Oral)   Resp 16   Wt 194 lb (88 kg)   BMI 28.65 kg/m   Physical Exam  Constitutional: He is oriented to person, place, and time and well-developed, well-nourished, and in no distress.  Genitourinary:  Genitourinary Comments: 3 small perianal fissures.   Neurological: He is alert and oriented  to person, place, and time.  Skin: Skin is dry.  Psychiatric: Mood, memory, affect and judgment normal.    Assessment and Plan :   1. Colon cancer screening  - Cologuard  2. Anal fissure Pt admits to aggressive cleaning after BMs.  I have done the exam and reviewed the chart and it is accurate to the best of my knowledge. Development worker, community has been used and  any errors in dictation or transcription are unintentional. Miguel Aschoff M.D. [facility]     HPI, Exam and A&P Transcribed under the direction and in the presence of Wilhemena Durie., MD. Electronically Signed: Althea Charon, [place]

## 2016-08-07 DIAGNOSIS — Z1211 Encounter for screening for malignant neoplasm of colon: Secondary | ICD-10-CM | POA: Diagnosis not present

## 2016-08-07 DIAGNOSIS — Z1212 Encounter for screening for malignant neoplasm of rectum: Secondary | ICD-10-CM | POA: Diagnosis not present

## 2016-08-11 LAB — COLOGUARD

## 2016-08-13 DIAGNOSIS — J019 Acute sinusitis, unspecified: Secondary | ICD-10-CM | POA: Diagnosis not present

## 2016-08-14 ENCOUNTER — Other Ambulatory Visit: Payer: Self-pay

## 2016-08-15 LAB — COLOGUARD: COLOGUARD: NEGATIVE

## 2016-08-21 ENCOUNTER — Other Ambulatory Visit: Payer: Self-pay | Admitting: Family Medicine

## 2016-09-03 ENCOUNTER — Other Ambulatory Visit: Payer: Self-pay | Admitting: Family Medicine

## 2016-10-04 ENCOUNTER — Other Ambulatory Visit: Payer: Self-pay | Admitting: Family Medicine

## 2016-10-04 DIAGNOSIS — I10 Essential (primary) hypertension: Secondary | ICD-10-CM

## 2016-11-05 ENCOUNTER — Ambulatory Visit (INDEPENDENT_AMBULATORY_CARE_PROVIDER_SITE_OTHER): Payer: Medicare HMO | Admitting: Family Medicine

## 2016-11-05 ENCOUNTER — Encounter: Payer: Self-pay | Admitting: Family Medicine

## 2016-11-05 VITALS — BP 138/76 | HR 68 | Temp 97.7°F | Resp 16 | Wt 191.0 lb

## 2016-11-05 DIAGNOSIS — B356 Tinea cruris: Secondary | ICD-10-CM

## 2016-11-05 NOTE — Progress Notes (Signed)
Subjective:  HPI Pt is here for what he thinks is "jock itch". It started about 3 weeks ago. He reports that it started about 3 weeks ago. He tried over the counter medications but it has not helped. He reports that it does not hurt, it just itches and is aggravating.   Prior to Admission medications   Medication Sig Start Date End Date Taking? Authorizing Provider  amLODipine (NORVASC) 5 MG tablet TAKE ONE (1) TABLET EACH DAY 12/21/15   Jerrol Banana., MD  simvastatin (ZOCOR) 20 MG tablet TAKE ONE TABLET BY MOUTH DAILY 08/21/16   Jerrol Banana., MD  simvastatin (ZOCOR) 20 MG tablet TAKE ONE TABLET BY MOUTH DAILY 09/03/16   Jerrol Banana., MD  valsartan (DIOVAN) 320 MG tablet TAKE ONE (1) TABLET EACH DAY 10/04/16   Jerrol Banana., MD    Patient Active Problem List   Diagnosis Date Noted  . 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 Hypercholesteremia 01/18/2015  . BP (high blood pressure) 01/18/2015  . Osteoarthrosis 01/18/2015  . 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 Compulsive tobacco user syndrome 01/18/2015  . Avitaminosis D 01/18/2015    History reviewed. No pertinent past medical history.  Social History   Social History  . Marital status: Married    Spouse name: N/A  . Number of children: N/A  . Years of education: N/A   Occupational History  . Not on file.   Social History Main Topics  . Smoking status: Former Research scientist (life sciences)  . Smokeless tobacco: Never Used  . Alcohol use No  . Drug use: No  . Sexual activity: Not on file   Other Topics Concern  . Not on file   Social History Narrative  . No narrative on file    No Known Allergies  Review of Systems  Constitutional: Negative.   HENT: Negative.   Eyes: Negative.   Respiratory: Negative.   Cardiovascular: Negative.   Gastrointestinal: Negative.   Genitourinary: Negative.   Musculoskeletal: Negative.   Skin: Positive for itching and rash.  Neurological: Negative.     Endo/Heme/Allergies: Negative.   Psychiatric/Behavioral: Negative.     Immunization History  Administered Date(s) Administered  . Tdap 03/31/2008  . Zoster 11/04/2012    Objective:  BP 138/76 (BP Location: Left Arm, Patient Position: Sitting, Cuff Size: Normal)   Pulse 68   Temp 97.7 F (36.5 C) (Oral)   Resp 16   Wt 191 lb (86.6 kg)   BMI 28.21 kg/m   Physical Exam  Constitutional: He is oriented to person, place, and time and well-developed, well-nourished, and in no distress.  HENT:  Head: Normocephalic and atraumatic.  Neurological: He is alert and oriented to person, place, and time. Gait normal. GCS score is 15.  Skin: Skin is warm and dry.  Plum-sized sebaceous cyst noted on left thoracic back. Barely discernible rash in groin consistent with possible tinea cruris.  Psychiatric: Mood, memory, affect and judgment normal.    Lab Results  Component Value Date   WBC 5.9 12/08/2015   HGB 14.8 12/08/2015   HCT 43.6 12/08/2015   PLT 259 12/08/2015   GLUCOSE 99 12/08/2015   CHOL 209 (H) 12/08/2015   TRIG 168 (H) 12/08/2015   HDL 54 12/08/2015   LDLCALC 121 (H) 12/08/2015   TSH 2.800 12/08/2015   INR 1.1 05/28/2014    CMP     Component Value Date/Time   NA 141 12/08/2015 0800   NA  139 05/28/2014 1115   K 4.7 12/08/2015 0800   K 4.4 05/28/2014 1115   CL 102 12/08/2015 0800   CL 106 05/28/2014 1115   CO2 24 12/08/2015 0800   CO2 25 05/28/2014 1115   GLUCOSE 99 12/08/2015 0800   GLUCOSE 115 (H) 05/28/2014 1115   BUN 18 12/08/2015 0800   BUN 13 05/28/2014 1115   CREATININE 1.10 12/08/2015 0800   CREATININE 1.13 05/28/2014 1115   CALCIUM 9.3 12/08/2015 0800   CALCIUM 8.6 05/28/2014 1115   PROT 6.7 12/08/2015 0800   PROT 7.0 05/28/2014 1115   ALBUMIN 4.4 12/08/2015 0800   ALBUMIN 4.0 05/28/2014 1115   AST 14 12/08/2015 0800   AST 16 05/28/2014 1115   ALT 17 12/08/2015 0800   ALT 28 05/28/2014 1115   ALKPHOS 50 12/08/2015 0800   ALKPHOS 50 05/28/2014  1115   BILITOT 0.3 12/08/2015 0800   BILITOT 0.4 05/28/2014 1115   GFRNONAA 69 12/08/2015 0800   GFRNONAA >60 05/28/2014 1115   GFRAA 80 12/08/2015 0800   GFRAA >60 05/28/2014 1115    Assessment and Plan :  1. Tinea cruris May need dermatology referral for both these issues in the future if they bother the patient. 2. Sebaceous cyst  I have done the exam and reviewed the above chart and it is accurate to the best of my knowledge. Development worker, community has been used in this note in any air is in the dictation or transcription are unintentional.  [facility] 11/05/2016 3:45 PM

## 2016-11-05 NOTE — Patient Instructions (Addendum)
Try over the counter Tinactin.

## 2016-11-30 ENCOUNTER — Other Ambulatory Visit: Payer: Self-pay | Admitting: Family Medicine

## 2016-11-30 MED ORDER — LOSARTAN POTASSIUM 100 MG PO TABS: 100.0000 mg | ORAL_TABLET | Freq: Every day | ORAL | 3 refills | Status: DC

## 2016-11-30 NOTE — Telephone Encounter (Signed)
Jody at [facility] called to say patient is on the recalled blood pressure medication  He needs to know what he needs to be changed to   Please advise  [phone]  Thanks teri

## 2016-11-30 NOTE — Telephone Encounter (Signed)
Need to know what medication to switch to and then will call patient back to advise. Please review thank you-aa

## 2016-11-30 NOTE — Telephone Encounter (Signed)
Losartan 100mg  daily,

## 2016-11-30 NOTE — Telephone Encounter (Signed)
Pt is requesting a call back to discuss the Rx Valsartan and asking what he will be switched to.  SF#[phone]

## 2016-11-30 NOTE — Telephone Encounter (Signed)
Med sent in. LMTCB for pt. Thanks

## 2016-11-30 NOTE — Telephone Encounter (Signed)
Patient is on Diovan 320 mg-aa

## 2016-12-04 NOTE — Telephone Encounter (Signed)
Pt advised of medication change on his personal voicemail-aa

## 2016-12-10 ENCOUNTER — Ambulatory Visit: Payer: Medicare HMO | Admitting: Family Medicine

## 2016-12-11 ENCOUNTER — Encounter: Payer: Self-pay | Admitting: Family Medicine

## 2016-12-11 ENCOUNTER — Ambulatory Visit (INDEPENDENT_AMBULATORY_CARE_PROVIDER_SITE_OTHER): Payer: Medicare HMO | Admitting: Family Medicine

## 2016-12-11 VITALS — BP 142/82 | HR 68 | Temp 97.7°F | Resp 16 | Wt 189.0 lb

## 2016-12-11 DIAGNOSIS — I10 Essential (primary) hypertension: Secondary | ICD-10-CM

## 2016-12-11 DIAGNOSIS — E7800 Pure hypercholesterolemia, unspecified: Secondary | ICD-10-CM | POA: Diagnosis not present

## 2016-12-11 DIAGNOSIS — K219 Gastro-esophageal reflux disease without esophagitis: Secondary | ICD-10-CM | POA: Diagnosis not present

## 2016-12-11 NOTE — Progress Notes (Signed)
Subjective:  HPI  Hypertension, follow-up:  BP Readings from Last 3 Encounters:  12/11/16 (!) 142/82  11/05/16 138/76  07/30/16 (!) 156/64    He was last seen for hypertension 6 months ago.  BP at that visit was 138/76. Management since that visit includes due to the recall, changed to Losartan. He reports good compliance with treatment. He is not having side effects.  He is exercising. He is adherent to low salt diet.   Outside blood pressures are 120-140's/70's. Patient denies chest pain, chest pressure/discomfort, claudication, dyspnea, exertional chest pressure/discomfort, fatigue, irregular heart beat, lower extremity edema, near-syncope, orthopnea, palpitations, paroxysmal nocturnal dyspnea, syncope and tachypnea.   Cardiovascular risk factors include advanced age (older than 66 for men, 84 for women), hypertension and male gender.   Wt Readings from Last 3 Encounters:  12/11/16 189 lb (85.7 kg)  11/05/16 191 lb (86.6 kg)  07/30/16 194 lb (88 kg)   ------------------------------------------------------------------------   Lipid/Cholesterol, Follow-up:   Last seen for this 6 months ago.  Management changes since that visit include None. Recheck levels at next OV if still elevated will need to switch to Crestor since can not increase Zocor any high due to interaction with amlodipine.  . Last Lipid Panel:    Component Value Date/Time   CHOL 209 (H) 12/08/2015 0800   TRIG 168 (H) 12/08/2015 0800   HDL 54 12/08/2015 0800   CHOLHDL 3.9 12/08/2015 0800   LDLCALC 121 (H) 12/08/2015 0800    He reports good compliance with treatment. He is not having side effects.   Wt Readings from Last 3 Encounters:  12/11/16 189 lb (85.7 kg)  11/05/16 191 lb (86.6 kg)  07/30/16 194 lb (88 kg)   -------------------------------------------------------------------  Vitamin D Def- Did not tolerated Vitamin D supplement. Recheck Vitamin D levels today.    Prior to Admission  medications   Medication Sig Start Date End Date Taking? Authorizing Provider  amLODipine (NORVASC) 5 MG tablet TAKE ONE (1) TABLET EACH DAY 12/21/15   Jerrol Banana., MD  losartan (COZAAR) 100 MG tablet Take 1 tablet (100 mg total) by mouth daily. 11/30/16   Jerrol Banana., MD  simvastatin (ZOCOR) 20 MG tablet TAKE ONE TABLET BY MOUTH DAILY 08/21/16   Jerrol Banana., MD  simvastatin (ZOCOR) 20 MG tablet TAKE ONE TABLET BY MOUTH DAILY 09/03/16   Jerrol Banana., MD    Patient Active Problem List   Diagnosis Date Noted  . 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 Hypercholesteremia 01/18/2015  . BP (high blood pressure) 01/18/2015  . Osteoarthrosis 01/18/2015  . 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 Compulsive tobacco user syndrome 01/18/2015  . Avitaminosis D 01/18/2015    History reviewed. No pertinent past medical history.  Social History   Social History  . Marital status: Married    Spouse name: N/A  . Number of children: N/A  . Years of education: N/A   Occupational History  . Not on file.   Social History Main Topics  . Smoking status: Former Research scientist (life sciences)  . Smokeless tobacco: Never Used  . Alcohol use No  . Drug use: No  . Sexual activity: Not on file   Other Topics Concern  . Not on file   Social History Narrative  . No narrative on file    No Known Allergies  Review of Systems  Constitutional: Negative.   HENT: Negative.   Eyes: Negative.   Respiratory: Negative.   Cardiovascular:  Negative.   Gastrointestinal: Negative.   Genitourinary: Negative.   Musculoskeletal: Negative.   Skin: Negative.   Neurological: Negative.   Endo/Heme/Allergies: Negative.   Psychiatric/Behavioral: Negative.     Immunization History  Administered Date(s) Administered  . Tdap 03/31/2008  . Zoster 11/04/2012    Objective:  BP (!) 142/82 (BP Location: Left Arm, Patient Position: Sitting, Cuff Size: Normal)   Pulse 68   Temp 97.7 F (36.5  C) (Oral)   Resp 16   Wt 189 lb (85.7 kg)   BMI 27.91 kg/m   Physical Exam  Constitutional: He is oriented to person, place, and time and well-developed, well-nourished, and in no distress.  HENT:  Head: Normocephalic and atraumatic.  Eyes: Conjunctivae are normal. No scleral icterus.  Neck: No thyromegaly present.  Cardiovascular: Normal rate, regular rhythm, normal heart sounds and intact distal pulses.   Pulmonary/Chest: Effort normal and breath sounds normal.  Abdominal: Soft.  Musculoskeletal: Normal range of motion.  Neurological: He is alert and oriented to person, place, and time. Gait normal. GCS score is 15.  Skin: Skin is warm and dry.  Psychiatric: Mood, memory, affect and judgment normal.    Lab Results  Component Value Date   WBC 5.9 12/08/2015   HGB 14.8 12/08/2015   HCT 43.6 12/08/2015   PLT 259 12/08/2015   GLUCOSE 99 12/08/2015   CHOL 209 (H) 12/08/2015   TRIG 168 (H) 12/08/2015   HDL 54 12/08/2015   LDLCALC 121 (H) 12/08/2015   TSH 2.800 12/08/2015   INR 1.1 05/28/2014    CMP     Component Value Date/Time   NA 141 12/08/2015 0800   NA 139 05/28/2014 1115   K 4.7 12/08/2015 0800   K 4.4 05/28/2014 1115   CL 102 12/08/2015 0800   CL 106 05/28/2014 1115   CO2 24 12/08/2015 0800   CO2 25 05/28/2014 1115   GLUCOSE 99 12/08/2015 0800   GLUCOSE 115 (H) 05/28/2014 1115   BUN 18 12/08/2015 0800   BUN 13 05/28/2014 1115   CREATININE 1.10 12/08/2015 0800   CREATININE 1.13 05/28/2014 1115   CALCIUM 9.3 12/08/2015 0800   CALCIUM 8.6 05/28/2014 1115   PROT 6.7 12/08/2015 0800   PROT 7.0 05/28/2014 1115   ALBUMIN 4.4 12/08/2015 0800   ALBUMIN 4.0 05/28/2014 1115   AST 14 12/08/2015 0800   AST 16 05/28/2014 1115   ALT 17 12/08/2015 0800   ALT 28 05/28/2014 1115   ALKPHOS 50 12/08/2015 0800   ALKPHOS 50 05/28/2014 1115   BILITOT 0.3 12/08/2015 0800   BILITOT 0.4 05/28/2014 1115   GFRNONAA 69 12/08/2015 0800   GFRNONAA >60 05/28/2014 1115   GFRAA  80 12/08/2015 0800   GFRAA >60 05/28/2014 1115    Assessment and Plan :  1. Essential hypertension  - CBC with Differential/Platelet - TSH  2. Hypercholesteremia If cholesterol still elevated will change to Crestor.   - Lipid Panel With LDL/HDL Ratio - Comprehensive metabolic panel  3. Gastroesophageal reflux disease, esophagitis presence not specified   HPI, Exam, and A&P Transcribed under the direction and in the presence of Channah Godeaux L. Cranford Mon, MD  Electronically Signed: Katina Dung, CMA I have done the exam and reviewed the above chart and it is accurate to the best of my knowledge. Development worker, community has been used in this note in any air is in the dictation or transcription are unintentional.  [facility] 12/11/2016 3:48  PM

## 2016-12-17 DIAGNOSIS — E7800 Pure hypercholesterolemia, unspecified: Secondary | ICD-10-CM | POA: Diagnosis not present

## 2016-12-17 DIAGNOSIS — I10 Essential (primary) hypertension: Secondary | ICD-10-CM | POA: Diagnosis not present

## 2016-12-18 LAB — CBC WITH DIFFERENTIAL/PLATELET
BASOS ABS: 0 10*3/uL (ref 0.0–0.2)
Basos: 0 %
EOS (ABSOLUTE): 0.1 10*3/uL (ref 0.0–0.4)
Eos: 1 %
HEMOGLOBIN: 15.1 g/dL (ref 13.0–17.7)
Hematocrit: 44.5 % (ref 37.5–51.0)
IMMATURE GRANS (ABS): 0 10*3/uL (ref 0.0–0.1)
IMMATURE GRANULOCYTES: 0 %
LYMPHS: 46 %
Lymphocytes Absolute: 3.2 10*3/uL — ABNORMAL HIGH (ref 0.7–3.1)
MCH: 30.4 pg (ref 26.6–33.0)
MCHC: 33.9 g/dL (ref 31.5–35.7)
MCV: 90 fL (ref 79–97)
MONOCYTES: 8 %
Monocytes Absolute: 0.6 10*3/uL (ref 0.1–0.9)
NEUTROS ABS: 3.1 10*3/uL (ref 1.4–7.0)
NEUTROS PCT: 45 %
PLATELETS: 258 10*3/uL (ref 150–379)
RBC: 4.96 x10E6/uL (ref 4.14–5.80)
RDW: 13.2 % (ref 12.3–15.4)
WBC: 6.9 10*3/uL (ref 3.4–10.8)

## 2016-12-18 LAB — COMPREHENSIVE METABOLIC PANEL
ALT: 13 IU/L (ref 0–44)
AST: 15 IU/L (ref 0–40)
Albumin/Globulin Ratio: 2 (ref 1.2–2.2)
Albumin: 4.8 g/dL (ref 3.6–4.8)
Alkaline Phosphatase: 59 IU/L (ref 39–117)
BILIRUBIN TOTAL: 0.5 mg/dL (ref 0.0–1.2)
BUN / CREAT RATIO: 14 (ref 10–24)
BUN: 16 mg/dL (ref 8–27)
CHLORIDE: 103 mmol/L (ref 96–106)
CO2: 23 mmol/L (ref 20–29)
Calcium: 9.6 mg/dL (ref 8.6–10.2)
Creatinine, Ser: 1.11 mg/dL (ref 0.76–1.27)
GFR calc non Af Amer: 68 mL/min/{1.73_m2} (ref >59)
GFR, EST AFRICAN AMERICAN: 78 mL/min/{1.73_m2} (ref >59)
GLUCOSE: 108 mg/dL — AB (ref 65–99)
Globulin, Total: 2.4 g/dL (ref 1.5–4.5)
POTASSIUM: 5.2 mmol/L (ref 3.5–5.2)
Sodium: 141 mmol/L (ref 134–144)
TOTAL PROTEIN: 7.2 g/dL (ref 6.0–8.5)

## 2016-12-18 LAB — LIPID PANEL WITH LDL/HDL RATIO
CHOLESTEROL TOTAL: 211 mg/dL — AB (ref 100–199)
HDL: 64 mg/dL (ref >39)
LDL Calculated: 116 mg/dL — ABNORMAL HIGH (ref 0–99)
LDl/HDL Ratio: 1.8 ratio (ref 0.0–3.6)
TRIGLYCERIDES: 155 mg/dL — AB (ref 0–149)
VLDL CHOLESTEROL CAL: 31 mg/dL (ref 5–40)

## 2016-12-18 LAB — TSH: TSH: 2.13 u[IU]/mL (ref 0.450–4.500)

## 2017-01-18 ENCOUNTER — Other Ambulatory Visit: Payer: Self-pay | Admitting: Family Medicine

## 2017-01-18 DIAGNOSIS — I10 Essential (primary) hypertension: Secondary | ICD-10-CM

## 2017-01-18 NOTE — Telephone Encounter (Signed)
Please review. Thanks!  

## 2017-04-01 DIAGNOSIS — E782 Mixed hyperlipidemia: Secondary | ICD-10-CM | POA: Diagnosis not present

## 2017-04-01 DIAGNOSIS — R42 Dizziness and giddiness: Secondary | ICD-10-CM | POA: Diagnosis not present

## 2017-04-01 DIAGNOSIS — I10 Essential (primary) hypertension: Secondary | ICD-10-CM | POA: Diagnosis not present

## 2017-04-17 ENCOUNTER — Other Ambulatory Visit: Payer: Self-pay | Admitting: Family Medicine

## 2017-04-17 DIAGNOSIS — I10 Essential (primary) hypertension: Secondary | ICD-10-CM

## 2017-04-18 DIAGNOSIS — I10 Essential (primary) hypertension: Secondary | ICD-10-CM | POA: Diagnosis not present

## 2017-05-21 ENCOUNTER — Telehealth: Payer: Self-pay

## 2017-06-04 NOTE — Telephone Encounter (Signed)
LMTCB and schedule AWV with [facility] prior to physical on 05/16/16.

## 2017-06-12 ENCOUNTER — Other Ambulatory Visit: Payer: Self-pay

## 2017-06-12 ENCOUNTER — Ambulatory Visit (INDEPENDENT_AMBULATORY_CARE_PROVIDER_SITE_OTHER): Payer: Medicare HMO | Admitting: Family Medicine

## 2017-06-12 ENCOUNTER — Encounter: Payer: Self-pay | Admitting: Family Medicine

## 2017-06-12 VITALS — BP 136/72 | HR 80 | Temp 98.1°F | Resp 12 | Ht 68.5 in | Wt 189.2 lb

## 2017-06-12 DIAGNOSIS — Z23 Encounter for immunization: Secondary | ICD-10-CM | POA: Diagnosis not present

## 2017-06-12 DIAGNOSIS — Z87891 Personal history of nicotine dependence: Secondary | ICD-10-CM | POA: Diagnosis not present

## 2017-06-12 DIAGNOSIS — L570 Actinic keratosis: Secondary | ICD-10-CM

## 2017-06-12 DIAGNOSIS — Z1211 Encounter for screening for malignant neoplasm of colon: Secondary | ICD-10-CM | POA: Diagnosis not present

## 2017-06-12 DIAGNOSIS — E7800 Pure hypercholesterolemia, unspecified: Secondary | ICD-10-CM | POA: Diagnosis not present

## 2017-06-12 DIAGNOSIS — Z0000 Encounter for general adult medical examination without abnormal findings: Secondary | ICD-10-CM | POA: Diagnosis not present

## 2017-06-12 DIAGNOSIS — Z122 Encounter for screening for malignant neoplasm of respiratory organs: Secondary | ICD-10-CM | POA: Diagnosis not present

## 2017-06-12 DIAGNOSIS — Z6828 Body mass index (BMI) 28.0-28.9, adult: Secondary | ICD-10-CM | POA: Diagnosis not present

## 2017-06-12 DIAGNOSIS — Z2821 Immunization not carried out because of patient refusal: Secondary | ICD-10-CM

## 2017-06-12 DIAGNOSIS — I10 Essential (primary) hypertension: Secondary | ICD-10-CM

## 2017-06-12 LAB — POCT URINALYSIS DIPSTICK
BILIRUBIN UA: NEGATIVE
Blood, UA: NEGATIVE
Glucose, UA: NEGATIVE
Ketones, UA: NEGATIVE
Leukocytes, UA: NEGATIVE
Nitrite, UA: NEGATIVE
PH UA: 6.5 (ref 5.0–8.0)
PROTEIN UA: NEGATIVE
Spec Grav, UA: 1.015 (ref 1.010–1.025)
UROBILINOGEN UA: 0.2 U/dL

## 2017-06-12 LAB — IFOBT (OCCULT BLOOD): IFOBT: NEGATIVE

## 2017-06-12 MED ORDER — ROSUVASTATIN CALCIUM 20 MG PO TABS: 20.0000 mg | ORAL_TABLET | Freq: Every day | ORAL | 3 refills | Status: DC

## 2017-06-12 NOTE — Progress Notes (Signed)
Patient: Pedro Swanson, Male    DOB: 1950/08/04, 67 y.o.   MRN: [id number] Visit Date: 06/12/2017  Today's Provider: Wilhemena Durie, MD   Chief Complaint  Patient presents with  . Medicare Wellness   Subjective:   Pedro Swanson is a 67 y.o. male who presents today for his Subsequent Annual Wellness Visit. He feels fairly well. He reports exercising no specific regimen-he does have a physical job he does. He reports he is sleeping fairly well.  Cologuard done on 08/08/2016-negative result. Never had colonoscopy Routine lab work was done on 12/17/2016 Review of Systems  Constitutional: Negative.   HENT: Positive for hearing loss.   Eyes: Negative.   Respiratory: Negative.   Cardiovascular: Negative.   Gastrointestinal: Negative.   Endocrine: Positive for cold intolerance.  Genitourinary: Negative.   Musculoskeletal: Negative.   Skin: Negative.   Allergic/Immunologic: Negative.   Neurological: Negative.   Hematological: Negative.   Psychiatric/Behavioral: Positive for sleep disturbance.    Patient Active Problem List   Diagnosis Date Noted  . 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 Hypercholesteremia 01/18/2015  . BP (high blood pressure) 01/18/2015  . Osteoarthrosis 01/18/2015  . 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 Compulsive tobacco user syndrome 01/18/2015  . Avitaminosis D 01/18/2015    Social History   Socioeconomic History  . Marital status: Married    Spouse name: Not on file  . Number of children: Not on file  . Years of education: Not on file  . Highest education level: Not on file  Social Needs  . Financial resource strain: Not on file  . Food insecurity - worry: Not on file  . Food insecurity - inability: Not on file  . Transportation needs - medical: Not on file  . Transportation needs - non-medical: Not on file  Occupational History  . Not on file  Tobacco Use  . Smoking status: Former Research scientist (life sciences)  . Smokeless tobacco: Never Used   Substance and Sexual Activity  . Alcohol use: Yes    Alcohol/week: 0.0 oz  . Drug use: No  . Sexual activity: Not on file  Other Topics Concern  . Not on file  Social History Narrative  . Not on file    No past surgical history on file.  His family history includes CAD in his father; Hypertension in his father.     Outpatient Encounter Medications as of 06/12/2017  Medication Sig  . amLODipine (NORVASC) 5 MG tablet TAKE ONE (1) TABLET EACH DAY  . simvastatin (ZOCOR) 20 MG tablet TAKE ONE TABLET BY MOUTH DAILY  . telmisartan (MICARDIS) 80 MG tablet Take 1 tablet by mouth daily.  . [DISCONTINUED] losartan (COZAAR) 100 MG tablet Take 1 tablet (100 mg total) by mouth daily.  . [DISCONTINUED] simvastatin (ZOCOR) 20 MG tablet TAKE ONE TABLET BY MOUTH DAILY   No facility-administered encounter medications on file as of 06/12/2017.     No Known Allergies  Patient Care Team: Jerrol Banana., MD as PCP - General (Family Medicine)   Objective:   Vitals:  Vitals:   06/12/17 0922  BP: 136/72  Pulse: 80  Resp: 12  Temp: 98.1 F (36.7 C)  Weight: 189 lb 3.2 oz (85.8 kg)  Height: 5' 8.5" (1.74 m)    Physical Exam  Constitutional: He is oriented to person, place, and time. He appears well-developed and well-nourished.  HENT:  Head: Normocephalic and atraumatic.  Right Ear: External ear normal.  Left Ear: External ear normal.  Nose: Nose normal.  Eyes: Conjunctivae are normal. No scleral icterus.  Neck: No thyromegaly present.  Cardiovascular: Normal rate, regular rhythm and normal heart sounds.  Pulmonary/Chest: Effort normal and breath sounds normal.  Abdominal: Soft.  Apparent ventral hernia noted.  Genitourinary: Rectum normal, prostate normal and penis normal.  Musculoskeletal: Normal range of motion. He exhibits no edema.  Left clavicle obviously has had had AC severe sprain in past.  Neurological: He is alert and oriented to person, place, and time.  Skin:  Skin is warm and dry.  Psychiatric: He has a normal mood and affect. His behavior is normal. Judgment and thought content normal.    Activities of Daily Living In your present state of health, do you have any difficulty performing the following activities: 06/12/2017 11/05/2016  Hearing? Y N  Vision? Y N  Difficulty concentrating or making decisions? N N  Walking or climbing stairs? N N  Dressing or bathing? N N  Doing errands, shopping? N N  Some recent data might be hidden    Fall Risk Assessment Fall Risk  06/12/2017 06/12/2016 04/04/2015  Falls in the past year? No No No     Depression Screen PHQ 2/9 Scores 06/12/2017 06/12/2016 04/04/2015  PHQ - 2 Score 0 0 0  PHQ- 9 Score 3 - -    Cognitive Testing - 6-CIT    Year: 0 4 points  Month: 0 3 points  Memorize "Pia Mau, [address], [address] [place]"  Time (within 1 hour:) 0 3 points  Count backwards from 20: 0 2 4 points  Name months of year: 0 2 4 points  Repeat Address: 0 2 4 6 8 10  points   Total Score: 6/28  Interpretation : Normal (0-7) Abnormal (8-28)    Assessment & Plan:     Annual Wellness Visit  Reviewed patient's Family Medical History Reviewed and updated list of patient's medical providers Assessment of cognitive impairment was done Assessed patient's functional ability Established a written schedule for health screening [place] Completed and Reviewed  1. Annual physical exam - POCT Urinalysis Dipstick  2. BMI 28.0-28.9,adult  3. Colon cancer screening - IFOBT POC (occult bld, rslt in office); Future  4. Pneumococcal vaccination declined by patient 5. Need for immunization against influenza - Flu vaccine HIGH DOSE PF (Fluzone High dose)  6. Essential hypertension  7. AK (actinic keratosis) - Ambulatory referral to Dermatology  8. Stopped smoking with greater than 40 pack year history - CT CHEST LUNG CA SCREEN LOW DOSE W/O CM; Future  9. Hypercholesteremia Change from  simvastatin to crestor and follow up in 2 months - rosuvastatin (CRESTOR) 20 MG tablet; Take 1 tablet (20 mg total) by mouth daily.  Dispense: 90 tablet; Refill: 3  10. Encounter for screening for malignant neoplasm of respiratory organs - CT CHEST LUNG CA SCREEN LOW DOSE W/O CM; Future  HPI, Exam and A&P transcribed by Tiffany Kocher, RMA under direction and in the presence of Miguel Aschoff, MD. I have done the exam and reviewed the chart and it is accurate to the best of my knowledge. Development worker, community has been used and  any errors in dictation or transcription are unintentional. Miguel Aschoff M.D. [facility]

## 2017-06-13 ENCOUNTER — Telehealth: Payer: Self-pay | Admitting: *Deleted

## 2017-06-13 DIAGNOSIS — Z87891 Personal history of nicotine dependence: Secondary | ICD-10-CM

## 2017-06-13 DIAGNOSIS — Z122 Encounter for screening for malignant neoplasm of respiratory organs: Secondary | ICD-10-CM

## 2017-06-13 NOTE — Telephone Encounter (Signed)
Received referral for initial lung cancer screening scan. Contacted patient and obtained smoking history,(former, quit 2012, 44 pack years) as well as answering questions related to screening process. Patient denies signs of lung cancer such as weight loss or hemoptysis. Patient denies comorbidity that would prevent curative treatment if lung cancer were found. Patient is scheduled for shared decision making visit and CT scan on 07/04/17.

## 2017-07-04 ENCOUNTER — Ambulatory Visit
Admission: RE | Admit: 2017-07-04 | Discharge: 2017-07-04 | Disposition: A | Discharge status: Home / Self Care | Payer: Medicare HMO | Source: Ambulatory Visit | Attending: Oncology | Admitting: Oncology

## 2017-07-04 ENCOUNTER — Inpatient Hospital Stay: Payer: No Typology Code available for payment source | Attending: Oncology | Admitting: Oncology

## 2017-07-04 ENCOUNTER — Encounter: Payer: Self-pay | Admitting: Oncology

## 2017-07-04 DIAGNOSIS — I700 Atherosclerosis of aorta: Secondary | ICD-10-CM | POA: Insufficient documentation

## 2017-07-04 DIAGNOSIS — Z87891 Personal history of nicotine dependence: Secondary | ICD-10-CM

## 2017-07-04 DIAGNOSIS — Z122 Encounter for screening for malignant neoplasm of respiratory organs: Secondary | ICD-10-CM

## 2017-07-04 DIAGNOSIS — I2510 Atherosclerotic heart disease of native coronary artery without angina pectoris: Secondary | ICD-10-CM | POA: Insufficient documentation

## 2017-07-04 DIAGNOSIS — J439 Emphysema, unspecified: Secondary | ICD-10-CM | POA: Insufficient documentation

## 2017-07-04 NOTE — Progress Notes (Signed)
In accordance with CMS guidelines, patient has met eligibility criteria including age, absence of signs or symptoms of lung cancer.  Social History   Tobacco Use  . Smoking status: Former Smoker    Packs/day: 1.00    Years: 44.00    Pack years: 44.00    Last attempt to quit: 2012    Years since quitting: 7.1  . Smokeless tobacco: Never Used  Substance Use Topics  . Alcohol use: Yes    Alcohol/week: 0.0 oz  . Drug use: No     A shared decision-making session was conducted prior to the performance of CT scan. This includes one or more decision aids, includes benefits and harms of screening, follow-up diagnostic testing, over-diagnosis, false positive rate, and total radiation exposure.  Counseling on the importance of adherence to annual lung cancer LDCT screening, impact of co-morbidities, and ability or willingness to undergo diagnosis and treatment is imperative for compliance of the program.  Counseling on the importance of continued smoking cessation for former smokers; the importance of smoking cessation for current smokers, and information about tobacco cessation interventions have been given to patient including [place] and [facility] quit [place] programs.  Written order for lung cancer screening with LDCT has been given to the patient and any and all questions have been answered to the best of my abilities.   Yearly follow up will be coordinated by Burgess Estelle, Thoracic Navigator.  Faythe Casa, NP 07/04/2017 2:57 PM

## 2017-07-05 ENCOUNTER — Encounter: Payer: Self-pay | Admitting: *Deleted

## 2017-07-05 ENCOUNTER — Telehealth: Payer: Self-pay | Admitting: *Deleted

## 2017-07-05 NOTE — Telephone Encounter (Signed)
Patient came by cancer center requesting to personally speak to [place], NP or shawn regarding his lung ct screening results. He was told that he would be notified with the results by today and no one had called him yet. Pt highly anxious and told front desk that he couldn't go the weekend without knowing his results. He states that he is so anxious. He requested to speak to a nurse if the NP was not available.  I was asked to speak to him by the front check receptionist to discuss his concerns. I reviewed the scan with him and explained the results showed that the needed a repeat ct screen in 1 year. I did mention that the radiologist had commented about cardiovascular disease (athrosclerosis). He states that he is already seeing a cardiologist Dr. Cammie Sickle and this was not a new finding to him. I explained that the reports have not yet been reviewed by Sonia Baller, NP today as she was not here. Pt insisted that Burgess Estelle meet with him on Monday with his wife to reiterate the same information to his wife. He requested a face to face with Shawn. He thanked me for my time. Reassurance was provided that I would given the information to [place].

## 2017-07-23 DIAGNOSIS — L821 Other seborrheic keratosis: Secondary | ICD-10-CM | POA: Diagnosis not present

## 2017-07-23 DIAGNOSIS — X32XXXA Exposure to sunlight, initial encounter: Secondary | ICD-10-CM | POA: Diagnosis not present

## 2017-07-23 DIAGNOSIS — L570 Actinic keratosis: Secondary | ICD-10-CM | POA: Diagnosis not present

## 2017-08-07 ENCOUNTER — Ambulatory Visit (INDEPENDENT_AMBULATORY_CARE_PROVIDER_SITE_OTHER): Payer: Medicare HMO | Admitting: Family Medicine

## 2017-08-07 ENCOUNTER — Encounter: Payer: Self-pay | Admitting: Family Medicine

## 2017-08-07 VITALS — BP 140/70 | HR 78 | Temp 97.7°F | Resp 14 | Wt 194.0 lb

## 2017-08-07 DIAGNOSIS — E7800 Pure hypercholesterolemia, unspecified: Secondary | ICD-10-CM | POA: Diagnosis not present

## 2017-08-07 DIAGNOSIS — I10 Essential (primary) hypertension: Secondary | ICD-10-CM

## 2017-08-07 MED ORDER — AMLODIPINE BESYLATE 10 MG PO TABS: 10.0000 mg | ORAL_TABLET | Freq: Every day | ORAL | 3 refills | Status: DC

## 2017-08-07 NOTE — Progress Notes (Signed)
Patient: Pedro Swanson Male    DOB: [birth year]   67 y.o.   MRN: [id number] Visit Date: 08/07/2017  Today's Provider: Wilhemena Durie, MD   Chief Complaint  Patient presents with  . Hyperlipidemia  . Hypertension   Subjective:    HPI  Lipid/Cholesterol, Follow-up:   Last seen for this 2 months ago.  Management changes since that visit include changed from Simvastatin to Crestor. . Last Lipid Panel:    Component Value Date/Time   CHOL 211 (H) 12/17/2016 1001   TRIG 155 (H) 12/17/2016 1001   HDL 64 12/17/2016 1001   CHOLHDL 3.9 12/08/2015 0800   LDLCALC 116 (H) 12/17/2016 1001   He reports good compliance with treatment. He is not having side effects.   Wt Readings from Last 3 Encounters:  08/07/17 194 lb (88 kg)  07/04/17 191 lb (86.6 kg)  06/12/17 189 lb 3.2 oz (85.8 kg)   -------------------------------------------------------------------      No Known Allergies   Current Outpatient Medications:  .  amLODipine (NORVASC) 5 MG tablet, TAKE ONE (1) TABLET EACH DAY, Disp: 90 tablet, Rfl: 3 .  rosuvastatin (CRESTOR) 20 MG tablet, Take 1 tablet (20 mg total) by mouth daily., Disp: 90 tablet, Rfl: 3 .  telmisartan (MICARDIS) 80 MG tablet, Take 1 tablet by mouth daily., Disp: , Rfl:   Review of Systems  Constitutional: Negative.   HENT: Negative.   Eyes: Negative.   Respiratory: Positive for shortness of breath.   Cardiovascular: Negative.   Gastrointestinal: Negative.   Endocrine: Negative.   Genitourinary: Negative.   Musculoskeletal: Negative.   Skin: Negative.   Allergic/Immunologic: Negative.   Neurological: Negative.   Hematological: Negative.   Psychiatric/Behavioral: Negative.     Social History   Tobacco Use  . Smoking status: Former Smoker    Packs/day: 1.00    Years: 44.00    Pack years: 44.00    Last attempt to quit: 2012    Years since quitting: 7.2  . Smokeless tobacco: Never Used  Substance Use Topics  . Alcohol use:  Yes    Alcohol/week: 0.0 oz   Objective:   BP 140/70 (BP Location: Left Arm, Patient Position: Sitting, Cuff Size: Normal)   Pulse 78   Temp 97.7 F (36.5 C) (Oral)   Resp 14   Wt 194 lb (88 kg)   BMI 27.84 kg/m  Vitals:   08/07/17 0840  BP: 140/70  Pulse: 78  Resp: 14  Temp: 97.7 F (36.5 C)  TempSrc: Oral  Weight: 194 lb (88 kg)     Physical Exam  Constitutional: He is oriented to person, place, and time. He appears well-developed and well-nourished.  HENT:  Head: Normocephalic and atraumatic.  Eyes: Pupils are equal, round, and reactive to light. Conjunctivae and EOM are normal.  Neck: Normal range of motion. Neck supple.  Cardiovascular: Normal rate, regular rhythm, normal heart sounds and intact distal pulses.  Pulmonary/Chest: Effort normal and breath sounds normal.  Abdominal: Soft.  Musculoskeletal: Normal range of motion.  Lymphadenopathy:    He has no cervical adenopathy.  Neurological: He is alert and oriented to person, place, and time. He has normal reflexes.  Skin: Skin is warm and dry.  Psychiatric: He has a normal mood and affect. His behavior is normal. Judgment and thought content normal.        Assessment & Plan:     1. Essential hypertension Increase amlodipine to 10 mg. Follow up in  2 months.  - amLODipine (NORVASC) 10 MG tablet; Take 1 tablet (10 mg total) by mouth daily.  Dispense: 90 tablet; Refill: 3  2. Hypercholesteremia      HPI, Exam, and A&P Transcribed under the direction and in the presence of Richard L. Cranford Mon, MD  Electronically Signed: Katina Dung, CMA  I have done the exam and reviewed the above chart and it is accurate to the best of my knowledge. Development worker, community has been used in this note in any air is in the dictation or transcription are unintentional.  Wilhemena Durie, MD  [place]

## 2017-08-09 ENCOUNTER — Ambulatory Visit: Payer: Medicare HMO | Admitting: Family Medicine

## 2017-08-12 DIAGNOSIS — E7800 Pure hypercholesterolemia, unspecified: Secondary | ICD-10-CM | POA: Diagnosis not present

## 2017-08-13 ENCOUNTER — Telehealth: Payer: Self-pay

## 2017-08-13 LAB — LIPID PANEL
CHOLESTEROL TOTAL: 172 mg/dL (ref 100–199)
Chol/HDL Ratio: 2.9 ratio (ref 0.0–5.0)
HDL: 59 mg/dL (ref >39)
LDL Calculated: 91 mg/dL (ref 0–99)
Triglycerides: 112 mg/dL (ref 0–149)
VLDL CHOLESTEROL CAL: 22 mg/dL (ref 5–40)

## 2017-08-13 NOTE — Telephone Encounter (Signed)
Patient advised as directed below. Patient requested a copy of labs to be printed for his records. Labs printed and placed up front ready for pick up.  Thanks,  -Joseline

## 2017-08-13 NOTE — Telephone Encounter (Signed)
-----   Message from Jerrol Banana., MD sent at 08/13/2017  1:11 PM EDT ----- Better.

## 2017-10-02 DIAGNOSIS — I2510 Atherosclerotic heart disease of native coronary artery without angina pectoris: Secondary | ICD-10-CM | POA: Insufficient documentation

## 2017-10-02 DIAGNOSIS — I10 Essential (primary) hypertension: Secondary | ICD-10-CM | POA: Diagnosis not present

## 2017-10-02 DIAGNOSIS — E782 Mixed hyperlipidemia: Secondary | ICD-10-CM | POA: Diagnosis not present

## 2017-10-02 DIAGNOSIS — R600 Localized edema: Secondary | ICD-10-CM | POA: Diagnosis not present

## 2017-10-09 ENCOUNTER — Ambulatory Visit: Payer: Self-pay | Admitting: Family Medicine

## 2017-11-07 DIAGNOSIS — R600 Localized edema: Secondary | ICD-10-CM | POA: Diagnosis not present

## 2017-11-07 DIAGNOSIS — I10 Essential (primary) hypertension: Secondary | ICD-10-CM | POA: Diagnosis not present

## 2017-11-07 DIAGNOSIS — E782 Mixed hyperlipidemia: Secondary | ICD-10-CM | POA: Diagnosis not present

## 2017-11-07 DIAGNOSIS — I2510 Atherosclerotic heart disease of native coronary artery without angina pectoris: Secondary | ICD-10-CM | POA: Diagnosis not present

## 2017-12-14 DIAGNOSIS — K219 Gastro-esophageal reflux disease without esophagitis: Secondary | ICD-10-CM | POA: Diagnosis not present

## 2017-12-14 DIAGNOSIS — K529 Noninfective gastroenteritis and colitis, unspecified: Secondary | ICD-10-CM | POA: Diagnosis not present

## 2018-03-21 DIAGNOSIS — H5213 Myopia, bilateral: Secondary | ICD-10-CM | POA: Diagnosis not present

## 2018-04-28 DIAGNOSIS — I2510 Atherosclerotic heart disease of native coronary artery without angina pectoris: Secondary | ICD-10-CM | POA: Diagnosis not present

## 2018-04-28 DIAGNOSIS — E782 Mixed hyperlipidemia: Secondary | ICD-10-CM | POA: Diagnosis not present

## 2018-04-28 DIAGNOSIS — I10 Essential (primary) hypertension: Secondary | ICD-10-CM | POA: Diagnosis not present

## 2018-05-01 ENCOUNTER — Other Ambulatory Visit: Payer: Self-pay | Admitting: Family Medicine

## 2018-05-01 DIAGNOSIS — I10 Essential (primary) hypertension: Secondary | ICD-10-CM

## 2018-05-01 DIAGNOSIS — E7800 Pure hypercholesterolemia, unspecified: Secondary | ICD-10-CM

## 2018-05-01 MED ORDER — AMLODIPINE BESYLATE 10 MG PO TABS: 10.0000 mg | ORAL_TABLET | Freq: Every day | ORAL | 3 refills | Status: DC

## 2018-05-01 MED ORDER — ROSUVASTATIN CALCIUM 20 MG PO TABS: 20.0000 mg | ORAL_TABLET | Freq: Every day | ORAL | 3 refills | Status: DC

## 2018-05-01 NOTE — Telephone Encounter (Signed)
Please review. Thanks!  

## 2018-05-01 NOTE — Telephone Encounter (Signed)
Pt will need refills on:  amLODipine (NORVASC) 10 MG tablet rosuvastatin (CRESTOR) 20 MG tablet  Please fill at new pharmacy:     [place], [place] - [place] [phone] (Phone) [phone] (Fax)    Thanks,  [employer]

## 2018-05-06 ENCOUNTER — Ambulatory Visit (INDEPENDENT_AMBULATORY_CARE_PROVIDER_SITE_OTHER): Payer: Medicare HMO

## 2018-05-06 DIAGNOSIS — Z23 Encounter for immunization: Secondary | ICD-10-CM

## 2018-06-20 ENCOUNTER — Telehealth: Payer: Self-pay

## 2018-06-20 NOTE — Telephone Encounter (Signed)
Call pt regarding lung screening. Pt is a former smoker. Pt state scan can be any day.As early as possible or latest one of the day. He denies any new health issues,

## 2018-06-24 ENCOUNTER — Telehealth: Payer: Self-pay | Admitting: *Deleted

## 2018-06-24 DIAGNOSIS — Z122 Encounter for screening for malignant neoplasm of respiratory organs: Secondary | ICD-10-CM

## 2018-06-24 DIAGNOSIS — Z87891 Personal history of nicotine dependence: Secondary | ICD-10-CM

## 2018-06-24 NOTE — Telephone Encounter (Signed)
Patient has been notified that annual lung cancer screening low dose CT scan is due currently or will be in near future. Confirmed that patient is within the age range of 55-77, and asymptomatic, (no signs or symptoms of lung cancer). Patient denies illness that would prevent curative treatment for lung cancer if found. Verified smoking history, (former, quit 2012, 44 pack year). The shared decision making visit was done 07/04/17. Patient is agreeable for CT scan being scheduled.

## 2018-06-25 ENCOUNTER — Encounter: Payer: Self-pay | Admitting: *Deleted

## 2018-07-01 DIAGNOSIS — H524 Presbyopia: Secondary | ICD-10-CM | POA: Diagnosis not present

## 2018-07-01 DIAGNOSIS — H35371 Puckering of macula, right eye: Secondary | ICD-10-CM | POA: Diagnosis not present

## 2018-07-01 DIAGNOSIS — H2513 Age-related nuclear cataract, bilateral: Secondary | ICD-10-CM | POA: Diagnosis not present

## 2018-07-07 ENCOUNTER — Ambulatory Visit (INDEPENDENT_AMBULATORY_CARE_PROVIDER_SITE_OTHER): Payer: Medicare HMO

## 2018-07-07 ENCOUNTER — Ambulatory Visit (INDEPENDENT_AMBULATORY_CARE_PROVIDER_SITE_OTHER): Payer: Medicare HMO | Admitting: Family Medicine

## 2018-07-07 ENCOUNTER — Ambulatory Visit
Admission: RE | Admit: 2018-07-07 | Discharge: 2018-07-07 | Disposition: A | Discharge status: Home / Self Care | Payer: Medicare HMO | Source: Ambulatory Visit | Attending: Nurse Practitioner | Admitting: Nurse Practitioner

## 2018-07-07 ENCOUNTER — Other Ambulatory Visit: Payer: Self-pay

## 2018-07-07 ENCOUNTER — Encounter: Payer: Self-pay | Admitting: Family Medicine

## 2018-07-07 VITALS — BP 118/58 | HR 63 | Temp 98.0°F | Ht 69.0 in | Wt 187.4 lb

## 2018-07-07 VITALS — BP 118/58 | Ht 69.0 in | Wt 187.0 lb

## 2018-07-07 DIAGNOSIS — E7800 Pure hypercholesterolemia, unspecified: Secondary | ICD-10-CM | POA: Diagnosis not present

## 2018-07-07 DIAGNOSIS — Z87891 Personal history of nicotine dependence: Secondary | ICD-10-CM

## 2018-07-07 DIAGNOSIS — I10 Essential (primary) hypertension: Secondary | ICD-10-CM | POA: Diagnosis not present

## 2018-07-07 DIAGNOSIS — I2510 Atherosclerotic heart disease of native coronary artery without angina pectoris: Secondary | ICD-10-CM | POA: Diagnosis not present

## 2018-07-07 DIAGNOSIS — Z0000 Encounter for general adult medical examination without abnormal findings: Secondary | ICD-10-CM | POA: Diagnosis not present

## 2018-07-07 DIAGNOSIS — N400 Enlarged prostate without lower urinary tract symptoms: Secondary | ICD-10-CM

## 2018-07-07 DIAGNOSIS — Z122 Encounter for screening for malignant neoplasm of respiratory organs: Secondary | ICD-10-CM

## 2018-07-07 NOTE — Progress Notes (Signed)
Patient: Pedro Swanson, Male    DOB: 1950-07-02, 68 y.o.   MRN: [id number] Visit Date: 07/07/2018  Today's Provider: Wilhemena Durie, MD   Chief Complaint  Patient presents with  . Annual Exam   Subjective:     Complete Physical Pedro Swanson is a 68 y.o. male. He feels well. He reports exercising with work. He reports he is sleeping poorly.  He states he is never slept well.  He is married.  He has been retired number of years now.  He paints houses on the side.  -----------------------------------------------------------   Review of Systems  Constitutional: Negative.   HENT: Negative.   Eyes: Positive for visual disturbance.  Respiratory: Negative.   Cardiovascular: Negative.   Gastrointestinal: Negative.   Endocrine: Positive for polyuria.  Genitourinary: Positive for urgency.  Musculoskeletal: Negative.   Skin: Negative.   Allergic/Immunologic: Negative.   Neurological: Negative.   Hematological: Negative.   Psychiatric/Behavioral: Negative.     Social History   Socioeconomic History  . Marital status: Married    Spouse name: Not on file  . Number of children: 2  . Years of education: Not on file  . Highest education level: Bachelor's degree (e.g., BA, AB, BS)  Occupational History  . Occupation: retired    Comment: Arboriculturist on the side  Social Needs  . Financial resource strain: Not hard at all  . Food insecurity:    Worry: Never true    Inability: Never true  . Transportation needs:    Medical: No    Non-medical: No  Tobacco Use  . Smoking status: Former Smoker    Packs/day: 1.00    Years: 44.00    Pack years: 44.00    Types: Cigarettes    Last attempt to quit: 2012    Years since quitting: 8.1  . Smokeless tobacco: Never Used  Substance and Sexual Activity  . Alcohol use: Yes    Alcohol/week: 13.0 standard drinks    Types: 9 Shots of liquor, 4 Cans of beer per week  . Drug use: No  . Sexual activity: Not on file    Lifestyle  . Physical activity:    Days per week: 0 days    Minutes per session: 0 min  . Stress: Not at all  Relationships  . Social connections:    Talks on phone: Patient refused    Gets together: Patient refused    Attends religious service: Patient refused    Active member of club or organization: Patient refused    Attends meetings of clubs or organizations: Patient refused    Relationship status: Patient refused  . Intimate partner violence:    Fear of current or ex partner: Patient refused    Emotionally abused: Patient refused    Physically abused: Patient refused    Forced sexual activity: Patient refused  Other Topics Concern  . Not on file  Social History Narrative  . Not on file    Past Medical History:  Diagnosis Date  . Cataract   . Hyperlipidemia   . Hypertension      Patient Active Problem List   Diagnosis Date Noted  . 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 Hypercholesteremia 01/18/2015  . BP (high blood pressure) 01/18/2015  . Osteoarthrosis 01/18/2015  . 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 Compulsive tobacco user syndrome 01/18/2015  . Avitaminosis D 01/18/2015    History reviewed. No pertinent surgical history.  His family history includes  CAD in his father; Hypertension in his father.   Current Outpatient Medications:  .  amLODipine (NORVASC) 10 MG tablet, Take 1 tablet (10 mg total) by mouth daily., Disp: 90 tablet, Rfl: 3 .  rosuvastatin (CRESTOR) 20 MG tablet, Take 1 tablet (20 mg total) by mouth daily., Disp: 90 tablet, Rfl: 3 .  telmisartan (MICARDIS) 80 MG tablet, Take 1 tablet by mouth daily., Disp: , Rfl:   Patient Care Team: Jerrol Banana., MD as PCP - General (Family Medicine) Corey Skains, MD as Consulting Physician (Cardiology)     Objective:    Vitals: BP (!) 118/58   Ht 5\' 9"  (1.753 m)   Wt 187 lb (84.8 kg)   BMI 27.62 kg/m   Physical Exam Vitals signs reviewed.  Constitutional:       Appearance: He is well-developed and normal weight.  HENT:     Head: Normocephalic and atraumatic.     Right Ear: Tympanic membrane and external ear normal.     Left Ear: Tympanic membrane and external ear normal.     Nose: Nose normal.     Mouth/Throat:     Pharynx: Oropharynx is clear.  Eyes:     General: No scleral icterus.    Conjunctiva/sclera: Conjunctivae normal.  Neck:     Thyroid: No thyromegaly.  Cardiovascular:     Rate and Rhythm: Normal rate and regular rhythm.     Heart sounds: Normal heart sounds.  Pulmonary:     Effort: Pulmonary effort is normal.     Breath sounds: Normal breath sounds.  Abdominal:     Palpations: Abdomen is soft.     Comments: Apparent ventral hernia noted.  Genitourinary:    Penis: Normal.      Prostate: Normal.     Rectum: Normal.  Musculoskeletal: Normal range of motion.     Comments: Left clavicle obviously has had had AC severe sprain in past.  Skin:    General: Skin is warm and dry.     Comments: Chronic  Cyst right upper back.  Neurological:     General: No focal deficit present.     Mental Status: He is alert and oriented to person, place, and time. Mental status is at baseline.  Psychiatric:        Mood and Affect: Mood normal.        Behavior: Behavior normal.        Thought Content: Thought content normal.        Judgment: Judgment normal.     Activities of Daily Living In your present state of health, do you have any difficulty performing the following activities: 07/07/2018  Hearing? N  Vision? Y  Comment Due to cataracts on both eyes.   Difficulty concentrating or making decisions? N  Walking or climbing stairs? N  Dressing or bathing? N  Doing errands, shopping? N  Preparing Food and eating ? N  Using the Toilet? N  In the past six months, have you accidently leaked urine? Y  Comment Occaisonally due to urgency.   Do you have problems with loss of bowel control? N  Managing your Medications? N  Managing your  Finances? N  Housekeeping or managing your Housekeeping? N  Some recent data might be hidden    Fall Risk Assessment Fall Risk  07/07/2018 07/07/2018 06/12/2017 06/12/2016 04/04/2015  Falls in the past year? 0 0 No No No     Depression Screen [facility] 2/9 Scores 07/07/2018 07/07/2018 06/12/2017 06/12/2016  PHQ - 2 Score 0 0 0 0  PHQ- 9 Score 2 - 3 -    6CIT Screen 06/12/2017  What Year? 0 points  What month? 0 points  What time? 0 points  Count back from 20 0 points  Months in reverse 0 points  Repeat phrase 6 points  Total Score 6       Assessment & Plan:    Annual Physical Reviewed patient's Family Medical History Reviewed and updated list of patient's medical providers Assessment of cognitive impairment was done Assessed patient's functional ability Established a written schedule for health screening [place] Completed and Reviewed  Exercise Activities and Dietary recommendations Goals    . DIET - INCREASE WATER INTAKE     Recommend to drink at least 6-8 8oz glasses of water per day.       Immunization History  Administered Date(s) Administered  . Influenza, High Dose Seasonal PF 06/12/2017, 05/06/2018  . Tdap 03/31/2008  . Zoster 11/04/2012    Health Maintenance  Topic Date Due  . PNA vac Low Risk Adult (1 of 2 - PCV13) 09/16/2013  . TETANUS/TDAP  03/31/2018  . Fecal DNA (Cologuard)  08/12/2019  . INFLUENZA VACCINE  Completed  . Hepatitis C Screening  Completed     Discussed health benefits of physical activity, and encouraged him to engage in regular exercise appropriate for his age and condition.  1. Annual physical exam Overall good health.  2. Essential hypertension  - CBC with Differential/Platelet - Comprehensive metabolic panel  3. Hypercholesteremia Crestor - Lipid panel - TSH  4. Benign fibroma of prostate - PSA 5.Aortic ASCVD Completely asymptomatic.  All risk factors treated.     ------------------------------------------------------------------------------------------------------------   I have done the exam and reviewed the above chart and it is accurate to the best of my knowledge. Development worker, community has been used in this note in any air is in the dictation or transcription are unintentional.  Wilhemena Durie, MD  [place]

## 2018-07-07 NOTE — Patient Instructions (Signed)
May stop aspirin daily.

## 2018-07-07 NOTE — Patient Instructions (Signed)
Pedro Swanson , Thank you for taking time to come for your Medicare Wellness Visit. I appreciate your ongoing commitment to your health goals. Please review the following plan we discussed and let me know if I can assist you in the future.   Screening recommendations/referrals: Colonoscopy: Cologuard due 08/12/19 Recommended yearly ophthalmology/optometry visit for glaucoma screening and checkup Recommended yearly dental visit for hygiene and checkup  Vaccinations: Influenza vaccine: Up to date Pneumococcal vaccine: Pt declines today.  Tdap vaccine: Pt declines today.  Shingles vaccine: Pt declines today.     Advanced directives: Advance directive discussed with you today. Even though you declined this today please call our office should you change your mind and we can give you the proper paperwork for you to fill out.  Conditions/risks identified: Recommend to drink at least 6-8 8oz glasses of water per day.  Next appointment: 10:00 AM today with Dr Rosanna Randy.  Preventive Care 15 Years and Older, Male Preventive care refers to lifestyle choices and visits with your health care provider that can promote health and wellness. What does preventive care include?  A yearly physical exam. This is also called an annual well check.  Dental exams once or twice a year.  Routine eye exams. Ask your health care provider how often you should have your eyes checked.  Personal lifestyle choices, including:  Daily care of your teeth and gums.  Regular physical activity.  Eating a healthy diet.  Avoiding tobacco and drug use.  Limiting alcohol use.  Practicing safe sex.  Taking low doses of aspirin every day.  Taking vitamin and mineral supplements as recommended by your health care provider. What happens during an annual well check? The services and screenings done by your health care provider during your annual well check will depend on your age, overall health, lifestyle risk factors, and  family history of disease. Counseling  Your health care provider may ask you questions about your:  Alcohol use.  Tobacco use.  Drug use.  Emotional well-being.  Home and relationship well-being.  Sexual activity.  Eating habits.  History of falls.  Memory and ability to understand (cognition).  Work and work Statistician. Screening  You may have the following tests or measurements:  Height, weight, and BMI.  Blood pressure.  Lipid and cholesterol levels. These may be checked every 5 years, or more frequently if you are over 43 years old.  Skin check.  Lung cancer screening. You may have this screening every year starting at age 61 if you have a 30-pack-year history of smoking and currently smoke or have quit within the past 15 years.  Fecal occult blood test (FOBT) of the stool. You may have this test every year starting at age 47.  Flexible sigmoidoscopy or colonoscopy. You may have a sigmoidoscopy every 5 years or a colonoscopy every 10 years starting at age 21.  Prostate cancer screening. Recommendations will vary depending on your family history and other risks.  Hepatitis C blood test.  Hepatitis B blood test.  Sexually transmitted disease (STD) testing.  Diabetes screening. This is done by checking your blood sugar (glucose) after you have not eaten for a while (fasting). You may have this done every 1-3 years.  Abdominal aortic aneurysm (AAA) screening. You may need this if you are a current or former smoker.  Osteoporosis. You may be screened starting at age 43 if you are at high risk. Talk with your health care provider about your test results, treatment options, and if  necessary, the need for more tests. Vaccines  Your health care provider may recommend certain vaccines, such as:  Influenza vaccine. This is recommended every year.  Tetanus, diphtheria, and acellular pertussis (Tdap, Td) vaccine. You may need a Td booster every 10 years.  Zoster  vaccine. You may need this after age 24.  Pneumococcal 13-valent conjugate (PCV13) vaccine. One dose is recommended after age 27.  Pneumococcal polysaccharide (PPSV23) vaccine. One dose is recommended after age 23. Talk to your health care provider about which screenings and vaccines you need and how often you need them. This information is not intended to replace advice given to you by your health care provider. Make sure you discuss any questions you have with your health care provider. Document Released: 05/27/2015 Document Revised: 01/18/2016 Document Reviewed: 03/01/2015 Elsevier Interactive Patient Education  2017 [place] Prevention in the Home Falls can cause injuries. They can happen to people of all ages. There are many things you can do to make your home safe and to help prevent falls. What can I do on the outside of my home?  Regularly fix the edges of walkways and driveways and fix any cracks.  Remove anything that might make you trip as you walk through a door, such as a raised step or threshold.  Trim any bushes or trees on the path to your home.  Use bright outdoor lighting.  Clear any walking paths of anything that might make someone trip, such as rocks or tools.  Regularly check to see if handrails are loose or broken. Make sure that both sides of any steps have handrails.  Any raised decks and porches should have guardrails on the edges.  Have any leaves, snow, or ice cleared regularly.  Use sand or salt on walking paths during winter.  Clean up any spills in your garage right away. This includes oil or grease spills. What can I do in the bathroom?  Use night lights.  Install grab bars by the toilet and in the tub and shower. Do not use towel bars as grab bars.  Use non-skid mats or decals in the tub or shower.  If you need to sit down in the shower, use a plastic, non-slip stool.  Keep the floor dry. Clean up any water that spills on the  floor as soon as it happens.  Remove soap buildup in the tub or shower regularly.  Attach bath mats securely with double-sided non-slip rug tape.  Do not have throw rugs and other things on the floor that can make you trip. What can I do in the bedroom?  Use night lights.  Make sure that you have a light by your bed that is easy to reach.  Do not use any sheets or blankets that are too big for your bed. They should not hang down onto the floor.  Have a firm chair that has side arms. You can use this for support while you get dressed.  Do not have throw rugs and other things on the floor that can make you trip. What can I do in the kitchen?  Clean up any spills right away.  Avoid walking on wet floors.  Keep items that you use a lot in easy-to-reach places.  If you need to reach something above you, use a strong step stool that has a grab bar.  Keep electrical cords out of the way.  Do not use floor polish or wax that makes floors slippery. If you must  use wax, use non-skid floor wax.  Do not have throw rugs and other things on the floor that can make you trip. What can I do with my stairs?  Do not leave any items on the stairs.  Make sure that there are handrails on both sides of the stairs and use them. Fix handrails that are broken or loose. Make sure that handrails are as long as the stairways.  Check any carpeting to make sure that it is firmly attached to the stairs. Fix any carpet that is loose or worn.  Avoid having throw rugs at the top or bottom of the stairs. If you do have throw rugs, attach them to the floor with carpet tape.  Make sure that you have a light switch at the top of the stairs and the bottom of the stairs. If you do not have them, ask someone to add them for you. What else can I do to help prevent falls?  Wear shoes that:  Do not have high heels.  Have rubber bottoms.  Are comfortable and fit you well.  Are closed at the toe. Do not wear  sandals.  If you use a stepladder:  Make sure that it is fully opened. Do not climb a closed stepladder.  Make sure that both sides of the stepladder are locked into place.  Ask someone to hold it for you, if possible.  Clearly mark and make sure that you can see:  Any grab bars or handrails.  First and last steps.  Where the edge of each step is.  Use tools that help you move around (mobility aids) if they are needed. These include:  Canes.  Walkers.  Scooters.  Crutches.  Turn on the lights when you go into a dark area. Replace any light bulbs as soon as they burn out.  Set up your furniture so you have a clear path. Avoid moving your furniture around.  If any of your floors are uneven, fix them.  If there are any pets around you, be aware of where they are.  Review your medicines with your doctor. Some medicines can make you feel dizzy. This can increase your chance of falling. Ask your doctor what other things that you can do to help prevent falls. This information is not intended to replace advice given to you by your health care provider. Make sure you discuss any questions you have with your health care provider. Document Released: 02/24/2009 Document Revised: 10/06/2015 Document Reviewed: 06/04/2014 Elsevier Interactive Patient Education  2017 [employer].

## 2018-07-07 NOTE — Progress Notes (Addendum)
Subjective:   Pedro Swanson is a 68 y.o. male who presents for an Initial Medicare Annual Wellness Visit.  Review of Systems  N/A  Cardiac Risk Factors include: advanced age (>70men, >63 women);dyslipidemia;hypertension;male gender    Objective:    Today's Vitals   07/07/18 0925  BP: (!) 118/58  Pulse: 63  Temp: 98 F (36.7 C)  TempSrc: Oral  Weight: 187 lb 6.4 oz (85 kg)  Height: 5\' 9"  (1.753 m)  PainSc: 0-No pain   Body mass index is 27.67 kg/m.  Advanced Directives 07/07/2018 07/30/2016  Does Patient Have a Medical Advance Directive? No No  Would patient like information on creating a medical advance directive? No - Patient declined -    Current Medications (verified) Outpatient Encounter Medications as of 07/07/2018  Medication Sig  . amLODipine (NORVASC) 10 MG tablet Take 1 tablet (10 mg total) by mouth daily.  . rosuvastatin (CRESTOR) 20 MG tablet Take 1 tablet (20 mg total) by mouth daily.  Marland Kitchen telmisartan (MICARDIS) 80 MG tablet Take 1 tablet by mouth daily.   No facility-administered encounter medications on file as of 07/07/2018.     Allergies (verified) Patient has no known allergies.   History: Past Medical History:  Diagnosis Date  . Cataract   . Hyperlipidemia   . Hypertension    History reviewed. No pertinent surgical history. Family History  Problem Relation Age of Onset  . CAD Father   . Hypertension Father    Social History   Socioeconomic History  . Marital status: Married    Spouse name: Not on file  . Number of children: 2  . Years of education: Not on file  . Highest education level: Bachelor's degree (e.g., BA, AB, BS)  Occupational History  . Occupation: retired    Comment: Arboriculturist on the side  Social Needs  . Financial resource strain: Not hard at all  . Food insecurity:    Worry: Never true    Inability: Never true  . Transportation needs:    Medical: No    Non-medical: No  Tobacco Use  . Smoking status: Former  Smoker    Packs/day: 1.00    Years: 44.00    Pack years: 44.00    Types: Cigarettes    Last attempt to quit: 2012    Years since quitting: 8.1  . Smokeless tobacco: Never Used  Substance and Sexual Activity  . Alcohol use: Yes    Alcohol/week: 13.0 standard drinks    Types: 9 Shots of liquor, 4 Cans of beer per week  . Drug use: No  . Sexual activity: Not on file  Lifestyle  . Physical activity:    Days per week: 0 days    Minutes per session: 0 min  . Stress: Not at all  Relationships  . Social connections:    Talks on phone: Patient refused    Gets together: Patient refused    Attends religious service: Patient refused    Active member of club or organization: Patient refused    Attends meetings of clubs or organizations: Patient refused    Relationship status: Patient refused  Other Topics Concern  . Not on file  Social History Narrative  . Not on file   Tobacco Counseling Counseling given: Not Answered   Clinical Intake:  Pre-visit preparation completed: Yes  Pain : No/denies pain Pain Score: 0-No pain     Nutritional Status: BMI 25 -29 Overweight Nutritional Risks: None Diabetes: No  How often do  you need to have someone help you when you read instructions, pamphlets, or other written materials from your doctor or pharmacy?: 1 - Never  Interpreter Needed?: No  Information entered by :: [facility], LPN  Activities of Daily Living In your present state of health, do you have any difficulty performing the following activities: 07/07/2018  Hearing? N  Vision? Y  Comment Due to cataracts on both eyes.   Difficulty concentrating or making decisions? N  Walking or climbing stairs? N  Dressing or bathing? N  Doing errands, shopping? N  Preparing Food and eating ? N  Using the Toilet? N  In the past six months, have you accidently leaked urine? Y  Comment Occaisonally due to urgency.   Do you have problems with loss of bowel control? N  Managing your  Medications? N  Managing your Finances? N  Housekeeping or managing your Housekeeping? N  Some recent data might be hidden     Immunizations and Health Maintenance Immunization History  Administered Date(s) Administered  . Influenza, High Dose Seasonal PF 06/12/2017, 05/06/2018  . Tdap 03/31/2008  . Zoster 11/04/2012   Health Maintenance Due  Topic Date Due  . PNA vac Low Risk Adult (1 of 2 - PCV13) 09/16/2013  . TETANUS/TDAP  03/31/2018    Patient Care Team: Jerrol Banana., MD as PCP - General (Family Medicine) Corey Skains, MD as Consulting Physician (Cardiology)  Indicate any recent Medical Services you may have received from other than [facility] providers in the past year (date may be approximate).    Assessment:   This is a routine wellness examination for Pedro Swanson.  Hearing/Vision screen Vision Screening Comments: Pt does not have regular eye exams. Pt plans to have cataract surgery in the future.   Dietary issues and exercise activities discussed: Current Exercise Habits: The patient does not participate in regular exercise at present, Exercise limited by: None identified  Goals    . DIET - INCREASE WATER INTAKE     Recommend to drink at least 6-8 8oz glasses of water per day.      Depression Screen PHQ 2/9 Scores 07/07/2018 07/07/2018 06/12/2017 06/12/2016  PHQ - 2 Score 0 0 0 0  PHQ- 9 Score 2 - 3 -    Fall Risk Fall Risk  07/07/2018 06/12/2017 06/12/2016 04/04/2015  Falls in the past year? 0 No No No    FALL RISK PREVENTION PERTAINING TO THE HOME: Any stairs in or around the home? No  If so, do they handrails? N/A  Home free of loose throw rugs in walkways, pet beds, electrical cords, etc? Yes  Adequate lighting in your home to reduce risk of falls? Yes   ASSISTIVE DEVICES UTILIZED TO PREVENT FALLS:  Life alert? No  Use of a cane, walker or w/c? No  Grab bars in the bathroom? No  Shower chair or bench in shower? Yes  Elevated toilet seat or a  handicapped toilet? Yes    TIMED UP AND GO:  Was the test performed? No .    Cognitive Function: Declined today.      6CIT Screen 06/12/2017  What Year? 0 points  What month? 0 points  What time? 0 points  Count back from 20 0 points  Months in reverse 0 points  Repeat phrase 6 points  Total Score 6    Screening Tests Health Maintenance  Topic Date Due  . PNA vac Low Risk Adult (1 of 2 - PCV13) 09/16/2013  . TETANUS/TDAP  03/31/2018  . Fecal DNA (Cologuard)  08/12/2019  . INFLUENZA VACCINE  Completed  . Hepatitis C Screening  Completed    Qualifies for Shingles Vaccine? Yes  Zostavax completed 11/04/12. Due for Shingrix. Education has been provided regarding the importance of this vaccine. Pt has been advised to call insurance company to determine out of pocket expense. Advised may also receive vaccine at local pharmacy or Health Dept. Verbalized acceptance and understanding.  Tdap: Although this vaccine is not a covered service during a Wellness Exam, does the patient still wish to receive this vaccine today?  No .  Education has been provided regarding the importance of this vaccine. Advised may receive this vaccine at local pharmacy or Health Dept. Aware to provide a copy of the vaccination record if obtained from local pharmacy or Health Dept. Verbalized acceptance and understanding.  Flu Vaccine: Up to date  Pneumococcal Vaccine: Due for Pneumococcal vaccine. Does the patient want to receive this vaccine today?  No . Education has been provided regarding the importance of this vaccine but still declined. Advised may receive this vaccine at local pharmacy or Health Dept. Aware to provide a copy of the vaccination record if obtained from local pharmacy or Health Dept. Verbalized acceptance and understanding.  Cancer Screenings:  Colorectal Screening: Cologuard completed 08/11/16. Repeat every 3 years.  Lung Cancer Screening: (Low Dose CT Chest recommended if Age 70-80  years, 30 pack-year currently smoking OR have quit w/in 15years.) does qualify however had this completed today.     Additional Screening:  Hepatitis C Screening: Up to date  Vision Screening: Recommended annual ophthalmology exams for early detection of glaucoma and other disorders of the eye.  Dental Screening: Recommended annual dental exams for proper oral hygiene  Community Resource Referral:  CRR required this visit?  No        Plan:  I have personally reviewed and addressed the Medicare Annual Wellness questionnaire and have noted the following in the patient's chart:  A. Medical and social history B. Use of alcohol, tobacco or illicit drugs  C. Current medications and supplements D. Functional ability and status E.  Nutritional status F.  Physical activity G. Advance directives H. List of other physicians I.  Hospitalizations, surgeries, and ER visits in previous 12 months J.  [place] such as hearing and vision if needed, cognitive and depression L. Referrals and appointments - none  In addition, I have reviewed and discussed with patient certain preventive protocols, quality metrics, and best practice recommendations. A written personalized care plan for preventive services as well as general preventive health recommendations were provided to patient.  See attached scanned questionnaire for additional information.   Signed,  Fabio Neighbors, LPN Nurse Health Advisor   Nurse Recommendations: Pt declined the tetanus and Prevnar 13 vaccines today.

## 2018-07-08 ENCOUNTER — Encounter: Payer: Self-pay | Admitting: *Deleted

## 2018-07-08 DIAGNOSIS — I2510 Atherosclerotic heart disease of native coronary artery without angina pectoris: Secondary | ICD-10-CM | POA: Insufficient documentation

## 2018-07-09 ENCOUNTER — Encounter: Payer: Self-pay | Admitting: *Deleted

## 2018-07-11 ENCOUNTER — Encounter: Payer: Self-pay | Admitting: *Deleted

## 2018-07-17 DIAGNOSIS — H25811 Combined forms of age-related cataract, right eye: Secondary | ICD-10-CM | POA: Diagnosis not present

## 2018-07-17 DIAGNOSIS — H2511 Age-related nuclear cataract, right eye: Secondary | ICD-10-CM | POA: Diagnosis not present

## 2018-07-25 DIAGNOSIS — I10 Essential (primary) hypertension: Secondary | ICD-10-CM | POA: Diagnosis not present

## 2018-07-25 DIAGNOSIS — N400 Enlarged prostate without lower urinary tract symptoms: Secondary | ICD-10-CM | POA: Diagnosis not present

## 2018-07-25 DIAGNOSIS — E7800 Pure hypercholesterolemia, unspecified: Secondary | ICD-10-CM | POA: Diagnosis not present

## 2018-07-26 LAB — COMPREHENSIVE METABOLIC PANEL
A/G RATIO: 2.2 (ref 1.2–2.2)
ALK PHOS: 51 IU/L (ref 39–117)
ALT: 17 IU/L (ref 0–44)
AST: 16 IU/L (ref 0–40)
Albumin: 4.7 g/dL (ref 3.8–4.8)
BILIRUBIN TOTAL: 0.4 mg/dL (ref 0.0–1.2)
BUN / CREAT RATIO: 14 (ref 10–24)
BUN: 16 mg/dL (ref 8–27)
CHLORIDE: 101 mmol/L (ref 96–106)
CO2: 21 mmol/L (ref 20–29)
Calcium: 9.5 mg/dL (ref 8.6–10.2)
Creatinine, Ser: 1.12 mg/dL (ref 0.76–1.27)
GFR calc non Af Amer: 67 mL/min/{1.73_m2} (ref >59)
GFR, EST AFRICAN AMERICAN: 77 mL/min/{1.73_m2} (ref >59)
GLUCOSE: 102 mg/dL — AB (ref 65–99)
Globulin, Total: 2.1 g/dL (ref 1.5–4.5)
POTASSIUM: 5.4 mmol/L — AB (ref 3.5–5.2)
SODIUM: 137 mmol/L (ref 134–144)
Total Protein: 6.8 g/dL (ref 6.0–8.5)

## 2018-07-26 LAB — CBC WITH DIFFERENTIAL/PLATELET
Basophils Absolute: 0 10*3/uL (ref 0.0–0.2)
Basos: 1 %
EOS (ABSOLUTE): 0 10*3/uL (ref 0.0–0.4)
Eos: 0 %
Hematocrit: 40.1 % (ref 37.5–51.0)
Hemoglobin: 13.7 g/dL (ref 13.0–17.7)
Immature Grans (Abs): 0 10*3/uL (ref 0.0–0.1)
Immature Granulocytes: 0 %
LYMPHS ABS: 2.4 10*3/uL (ref 0.7–3.1)
LYMPHS: 41 %
MCH: 31.1 pg (ref 26.6–33.0)
MCHC: 34.2 g/dL (ref 31.5–35.7)
MCV: 91 fL (ref 79–97)
MONOS ABS: 0.6 10*3/uL (ref 0.1–0.9)
Monocytes: 10 %
NEUTROS ABS: 2.9 10*3/uL (ref 1.4–7.0)
Neutrophils: 48 %
PLATELETS: 264 10*3/uL (ref 150–450)
RBC: 4.41 x10E6/uL (ref 4.14–5.80)
RDW: 11.8 % (ref 11.6–15.4)
WBC: 6 10*3/uL (ref 3.4–10.8)

## 2018-07-26 LAB — PSA: PROSTATE SPECIFIC AG, SERUM: 1.2 ng/mL (ref 0.0–4.0)

## 2018-07-26 LAB — LIPID PANEL
CHOLESTEROL TOTAL: 161 mg/dL (ref 100–199)
Chol/HDL Ratio: 2.8 ratio (ref 0.0–5.0)
HDL: 58 mg/dL (ref >39)
LDL Calculated: 71 mg/dL (ref 0–99)
Triglycerides: 158 mg/dL — ABNORMAL HIGH (ref 0–149)
VLDL CHOLESTEROL CAL: 32 mg/dL (ref 5–40)

## 2018-07-26 LAB — TSH: TSH: 1.94 u[IU]/mL (ref 0.450–4.500)

## 2018-08-15 DIAGNOSIS — H35371 Puckering of macula, right eye: Secondary | ICD-10-CM | POA: Diagnosis not present

## 2018-08-15 DIAGNOSIS — Z961 Presence of intraocular lens: Secondary | ICD-10-CM | POA: Diagnosis not present

## 2018-10-30 DIAGNOSIS — I10 Essential (primary) hypertension: Secondary | ICD-10-CM | POA: Diagnosis not present

## 2018-10-30 DIAGNOSIS — I2510 Atherosclerotic heart disease of native coronary artery without angina pectoris: Secondary | ICD-10-CM | POA: Diagnosis not present

## 2018-10-30 DIAGNOSIS — E782 Mixed hyperlipidemia: Secondary | ICD-10-CM | POA: Diagnosis not present

## 2019-02-16 ENCOUNTER — Ambulatory Visit (INDEPENDENT_AMBULATORY_CARE_PROVIDER_SITE_OTHER): Payer: Medicare HMO | Admitting: Family Medicine

## 2019-02-16 ENCOUNTER — Encounter: Payer: Self-pay | Admitting: Family Medicine

## 2019-02-16 ENCOUNTER — Other Ambulatory Visit: Payer: Self-pay

## 2019-02-16 VITALS — BP 136/68 | HR 82 | Temp 96.9°F | Wt 197.0 lb

## 2019-02-16 DIAGNOSIS — I10 Essential (primary) hypertension: Secondary | ICD-10-CM

## 2019-02-16 DIAGNOSIS — R2689 Other abnormalities of gait and mobility: Secondary | ICD-10-CM | POA: Diagnosis not present

## 2019-02-16 DIAGNOSIS — Z23 Encounter for immunization: Secondary | ICD-10-CM

## 2019-02-16 DIAGNOSIS — E7800 Pure hypercholesterolemia, unspecified: Secondary | ICD-10-CM

## 2019-02-16 NOTE — Patient Instructions (Addendum)
Stop Aspirin and start OCT vitamin C and D.   Cut Alcohol consumption  in half.

## 2019-02-16 NOTE — Progress Notes (Signed)
Patient: Pedro Swanson Male    DOB: [birth year]   68 y.o.   MRN: [id number] Visit Date: 02/16/2019  Today's Provider: Wilhemena Durie, MD   No chief complaint on file.  Subjective:     HPI   Pt would like to consult with Dr. Rosanna Randy about "personal" issues.   Wife is in with pt for todays visit.  He has some intermittant dizziness that bothers him on occasion.His wife c/OBJECTIVE: hyis slow gait and maybe unsteady gait. They also ask if it is ok to stop ASA.  No Known Allergies   Current Outpatient Medications:  .  amLODipine (NORVASC) 10 MG tablet, Take 1 tablet (10 mg total) by mouth daily., Disp: 90 tablet, Rfl: 3 .  aspirin EC 81 MG tablet, Take 81 mg by mouth daily., Disp: , Rfl:  .  rosuvastatin (CRESTOR) 20 MG tablet, Take 1 tablet (20 mg total) by mouth daily., Disp: 90 tablet, Rfl: 3 .  spironolactone (ALDACTONE) 25 MG tablet, TAKE ONE TABLET BY MOUTH EVERY DAY, Disp: , Rfl:  .  telmisartan (MICARDIS) 80 MG tablet, Take 1 tablet by mouth daily., Disp: , Rfl:   Review of Systems  Constitutional: Negative.   HENT: Negative.   Eyes: Negative.   Respiratory: Negative.   Cardiovascular: Negative.   Gastrointestinal: Negative.   Endocrine: Negative.   Musculoskeletal: Negative.   Allergic/Immunologic: Negative.   Neurological: Positive for dizziness. Negative for light-headedness and headaches.  Psychiatric/Behavioral: Negative.     Social History   Tobacco Use  . Smoking status: Former Smoker    Packs/day: 1.00    Years: 44.00    Pack years: 44.00    Types: Cigarettes    Quit date: 2012    Years since quitting: 8.7  . Smokeless tobacco: Never Used  Substance Use Topics  . Alcohol use: Yes    Alcohol/week: 13.0 standard drinks    Types: 9 Shots of liquor, 4 Cans of beer per week      Objective:   BP 136/68 (BP Location: Right Arm, Patient Position: Sitting, Cuff Size: Normal)   Pulse 82   Temp (!) 96.9 F (36.1 C) (Temporal)   Wt  197 lb (89.4 kg)   BMI 29.09 kg/m  Vitals:   02/16/19 0832  BP: 136/68  Pulse: 82  Temp: (!) 96.9 F (36.1 C)  TempSrc: Temporal  Weight: 197 lb (89.4 kg)  Body mass index is 29.09 kg/m.   Physical Exam Vitals signs reviewed.  Constitutional:      Appearance: He is well-developed and normal weight.  HENT:     Head: Normocephalic and atraumatic.     Right Ear: Tympanic membrane and external ear normal.     Left Ear: Tympanic membrane and external ear normal.     Nose: Nose normal.     Mouth/Throat:     Pharynx: Oropharynx is clear.  Eyes:     General: No scleral icterus.    Conjunctiva/sclera: Conjunctivae normal.  Neck:     Thyroid: No thyromegaly.  Cardiovascular:     Rate and Rhythm: Normal rate and regular rhythm.     Heart sounds: Normal heart sounds.  Pulmonary:     Effort: Pulmonary effort is normal.     Breath sounds: Normal breath sounds.  Abdominal:     Palpations: Abdomen is soft.     Comments: Apparent ventral hernia noted.  Genitourinary:    Penis: Normal.      Prostate: Normal.  Rectum: Normal.  Musculoskeletal: Normal range of motion.     Comments: Left clavicle obviously has had had AC severe sprain in past.  Skin:    General: Skin is warm and dry.     Comments: Chronic  Cyst right upper back.  Neurological:     General: No focal deficit present.     Mental Status: He is alert and oriented to person, place, and time.     Cranial Nerves: No cranial nerve deficit.     Comments: Shuffling gait.No tremor or cogwheeling noted.  Psychiatric:        Mood and Affect: Mood normal.        Behavior: Behavior normal.        Thought Content: Thought content normal.        Judgment: Judgment normal.      No results found for any visits on 02/16/19.     Assessment & Plan    1. Need for influenza vaccination Also needs pneumovax--has refused to this point. - Flu Vaccine QUAD High Dose(Fluad)  2. Shuffling gait  - Ambulatory referral to  Neurology  3. Need for pneumococcal vaccination  - Pneumococcal polysaccharide vaccine 23-valent greater than or equal to 2yo subcutaneous/IM  4. Essential hypertension Stop ASA --no documented CAD/ASCVD  5. Hypercholesteremia  6.URI On Vit D and C--use Zinc only on days with symptoms.     Richard Cranford Mon, MD  [facility]

## 2019-02-17 DIAGNOSIS — H5212 Myopia, left eye: Secondary | ICD-10-CM | POA: Diagnosis not present

## 2019-02-17 DIAGNOSIS — H5201 Hypermetropia, right eye: Secondary | ICD-10-CM | POA: Diagnosis not present

## 2019-02-17 DIAGNOSIS — H26491 Other secondary cataract, right eye: Secondary | ICD-10-CM | POA: Diagnosis not present

## 2019-02-17 DIAGNOSIS — H2512 Age-related nuclear cataract, left eye: Secondary | ICD-10-CM | POA: Diagnosis not present

## 2019-03-27 DIAGNOSIS — Z0100 Encounter for examination of eyes and vision without abnormal findings: Secondary | ICD-10-CM | POA: Diagnosis not present

## 2019-04-13 ENCOUNTER — Other Ambulatory Visit: Payer: Self-pay | Admitting: Neurology

## 2019-04-13 DIAGNOSIS — G20 Parkinson's disease: Secondary | ICD-10-CM

## 2019-04-13 DIAGNOSIS — G4752 REM sleep behavior disorder: Secondary | ICD-10-CM | POA: Diagnosis not present

## 2019-04-13 DIAGNOSIS — E519 Thiamine deficiency, unspecified: Secondary | ICD-10-CM | POA: Diagnosis not present

## 2019-04-13 DIAGNOSIS — E538 Deficiency of other specified B group vitamins: Secondary | ICD-10-CM | POA: Diagnosis not present

## 2019-04-13 DIAGNOSIS — R0683 Snoring: Secondary | ICD-10-CM | POA: Diagnosis not present

## 2019-04-13 DIAGNOSIS — E559 Vitamin D deficiency, unspecified: Secondary | ICD-10-CM | POA: Diagnosis not present

## 2019-04-22 ENCOUNTER — Other Ambulatory Visit: Payer: Self-pay

## 2019-04-22 ENCOUNTER — Ambulatory Visit
Admission: RE | Admit: 2019-04-22 | Discharge: 2019-04-22 | Disposition: A | Discharge status: Home / Self Care | Payer: Medicare HMO | Source: Ambulatory Visit | Attending: Neurology | Admitting: Neurology

## 2019-04-22 DIAGNOSIS — G20 Parkinson's disease: Secondary | ICD-10-CM | POA: Diagnosis not present

## 2019-04-22 MED ORDER — GADOBUTROL 1 MMOL/ML IV SOLN
9.0000 mL | Freq: Once | INTRAVENOUS | Status: AC | PRN
  Administered 2019-04-22: 18:00:00 9 mL via INTRAVENOUS

## 2019-04-28 DIAGNOSIS — E538 Deficiency of other specified B group vitamins: Secondary | ICD-10-CM | POA: Diagnosis not present

## 2019-05-04 DIAGNOSIS — I10 Essential (primary) hypertension: Secondary | ICD-10-CM | POA: Diagnosis not present

## 2019-05-04 DIAGNOSIS — I700 Atherosclerosis of aorta: Secondary | ICD-10-CM | POA: Diagnosis not present

## 2019-05-04 DIAGNOSIS — E782 Mixed hyperlipidemia: Secondary | ICD-10-CM | POA: Diagnosis not present

## 2019-05-04 DIAGNOSIS — I2510 Atherosclerotic heart disease of native coronary artery without angina pectoris: Secondary | ICD-10-CM | POA: Diagnosis not present

## 2019-05-05 DIAGNOSIS — E538 Deficiency of other specified B group vitamins: Secondary | ICD-10-CM | POA: Diagnosis not present

## 2019-05-11 DIAGNOSIS — E538 Deficiency of other specified B group vitamins: Secondary | ICD-10-CM | POA: Diagnosis not present

## 2019-05-18 DIAGNOSIS — E538 Deficiency of other specified B group vitamins: Secondary | ICD-10-CM | POA: Diagnosis not present

## 2019-06-10 ENCOUNTER — Ambulatory Visit: Payer: Medicare HMO

## 2019-06-18 ENCOUNTER — Other Ambulatory Visit: Payer: Self-pay | Admitting: Family Medicine

## 2019-06-18 DIAGNOSIS — E7800 Pure hypercholesterolemia, unspecified: Secondary | ICD-10-CM

## 2019-06-18 NOTE — Telephone Encounter (Signed)
Requested medication (s) are due for refill today: yes  Requested medication (s) are on the active medication list: yes  Last refill:  05/01/18  Future visit scheduled:yes  Notes to clinic: RX expired    Requested Prescriptions  Pending Prescriptions Disp Refills   rosuvastatin (CRESTOR) 20 MG tablet [Pharmacy Med Name: ROSUVASTATIN CALCIUM 20 MG TAB] 90 tablet 3    Sig: TAKE ONE TABLET EVERY DAY      Cardiovascular:  Antilipid - Statins Failed - 06/18/2019  8:54 AM      Failed - Triglycerides in normal range and within 360 days    Triglycerides  Date Value Ref Range Status  07/25/2018 158 (H) 0 - 149 mg/dL Final          Passed - Total Cholesterol in normal range and within 360 days    Cholesterol, Total  Date Value Ref Range Status  07/25/2018 161 100 - 199 mg/dL Final          Passed - LDL in normal range and within 360 days    LDL Calculated  Date Value Ref Range Status  07/25/2018 71 0 - 99 mg/dL Final          Passed - HDL in normal range and within 360 days    HDL  Date Value Ref Range Status  07/25/2018 58 >39 mg/dL Final          Passed - Patient is not pregnant      Passed - Valid encounter within last 12 months    Recent Outpatient Visits           4 months ago Need for influenza vaccination   [facility] Jerrol Banana., MD   11 months ago Annual physical exam   [facility] Jerrol Banana., MD   1 year ago [place] Jerrol Banana., MD   2 years ago Annual physical exam   [facility] Jerrol Banana., MD   2 years ago Essential hypertension   [facility] Jerrol Banana., MD       Future Appointments             In 3 weeks  [facility], [place]   In 3 weeks Jerrol Banana., MD [facility], [place]

## 2019-06-19 ENCOUNTER — Ambulatory Visit: Payer: Medicare HMO

## 2019-06-25 ENCOUNTER — Telehealth: Payer: Self-pay | Admitting: *Deleted

## 2019-06-25 DIAGNOSIS — Z87891 Personal history of nicotine dependence: Secondary | ICD-10-CM

## 2019-06-25 NOTE — Telephone Encounter (Signed)
 (  06/25/19) Patient has been notified that lung cancer screening CT scan is due currently or will be in near future. Confirmed that patient is within the appropriate age range, and asymptomatic. Patient denies illness that would prevent curative treatment for lung cancer if found. Verified smoking history (Former Smoker since 2012, 1 ppd). Patient is agreeable for CT scan being scheduled, and prefers an early appt  SRW

## 2019-06-26 ENCOUNTER — Encounter: Payer: Self-pay | Admitting: *Deleted

## 2019-06-29 NOTE — Telephone Encounter (Signed)
Smoking history: former, quti 2012, 44 pack year

## 2019-06-29 NOTE — Addendum Note (Signed)
Addended by: Lieutenant Diego on: 06/29/2019 11:46 AM   Modules accepted: Orders

## 2019-07-08 ENCOUNTER — Telehealth: Payer: Self-pay | Admitting: *Deleted

## 2019-07-08 NOTE — Telephone Encounter (Signed)
Patient reports getting 2nd covid vaccine tomorrow and would like to wait at least 4 weeks to schedule next lung screening scan.

## 2019-07-09 ENCOUNTER — Ambulatory Visit: Payer: Medicare HMO

## 2019-07-10 ENCOUNTER — Ambulatory Visit: Payer: Medicare HMO

## 2019-07-13 NOTE — Progress Notes (Signed)
Subjective:   Pedro Swanson is a 69 y.o. male who presents for Medicare Annual/Subsequent preventive examination.    This visit is being conducted through telemedicine due to the COVID-19 pandemic. This patient has given me verbal consent via doximity to conduct this visit, patient states they are participating from their home address. Some vital signs may be absent or patient reported.    Patient identification: identified by name, DOB, and current address  Review of Systems:  N/A  Cardiac Risk Factors include: advanced age (>19men, >62 women);dyslipidemia;hypertension;male gender     Objective:    Vitals: There were no vitals taken for this visit.  There is no height or weight on file to calculate BMI. Unable to obtain vitals due to visit being conducted via telephonically.   Advanced Directives 07/14/2019 07/07/2018 07/30/2016  Does Patient Have a Medical Advance Directive? No No No  Would patient like information on creating a medical advance directive? No - Patient declined No - Patient declined -    Tobacco Social History   Tobacco Use  Smoking Status Former Smoker  . Packs/day: 1.00  . Years: 44.00  . Pack years: 44.00  . Types: Cigarettes  . Quit date: 2012  . Years since quitting: 9.1  Smokeless Tobacco Never Used     Counseling given: Not Answered   Clinical Intake:  Pre-visit preparation completed: Yes  Pain : No/denies pain Pain Score: 0-No pain     Nutritional Risks: None Diabetes: No  How often do you need to have someone help you when you read instructions, pamphlets, or other written materials from your doctor or pharmacy?: 1 - Never  Interpreter Needed?: No  Information entered by :: [facility], LPN  Past Medical History:  Diagnosis Date  . Cataract   . Hyperlipidemia   . Hypertension    Past Surgical History:  Procedure Laterality Date  . CATARACT EXTRACTION     right eye   Family History  Problem Relation Age of Onset  .  CAD Father   . Hypertension Father    Social History   Socioeconomic History  . Marital status: Married    Spouse name: Not on file  . Number of children: 2  . Years of education: Not on file  . Highest education level: Bachelor's degree (e.g., BA, AB, BS)  Occupational History  . Occupation: retired    Comment: paints on the side  Tobacco Use  . Smoking status: Former Smoker    Packs/day: 1.00    Years: 44.00    Pack years: 44.00    Types: Cigarettes    Quit date: 2012    Years since quitting: 9.1  . Smokeless tobacco: Never Used  Substance and Sexual Activity  . Alcohol use: Yes    Alcohol/week: 7.0 standard drinks    Types: 7 Shots of liquor per week    Comment: half drink daily  . Drug use: No  . Sexual activity: Not on file  Other Topics Concern  . Not on file  Social History Narrative  . Not on file   Social Determinants of Health   Financial Resource Strain: Low Risk   . Difficulty of Paying Living Expenses: Not hard at all  Food Insecurity: No Food Insecurity  . Worried About Charity fundraiser in the Last Year: Never true  . Ran Out of Food in the Last Year: Never true  Transportation Needs: No Transportation Needs  . Lack of Transportation (Medical): No  . Lack  of Transportation (Non-Medical): No  Physical Activity: Inactive  . Days of Exercise per Week: 0 days  . Minutes of Exercise per Session: 0 min  Stress: No Stress Concern Present  . Feeling of Stress : Not at all  Social Connections: Somewhat Isolated  . Frequency of Communication with Friends and Family: More than three times a week  . Frequency of Social Gatherings with Friends and Family: More than three times a week  . Attends Religious Services: Never  . Active Member of Clubs or Organizations: No  . Attends Archivist Meetings: Never  . Marital Status: Married    Outpatient Encounter Medications as of 07/14/2019  Medication Sig  . amLODipine (NORVASC) 10 MG tablet Take 1  tablet (10 mg total) by mouth daily.  Marland Kitchen ascorbic acid (VITAMIN C) 500 MG tablet Take 500 mg by mouth daily.  Marland Kitchen aspirin EC 81 MG tablet Take 81 mg by mouth. Three times weekly  . Cholecalciferol 25 MCG (1000 UT) capsule Take 1,000 Units by mouth daily.   . cyanocobalamin (,VITAMIN B-12,) 1000 MCG/ML injection Inject 1,000 mcg into the muscle every 30 (thirty) days.   . rosuvastatin (CRESTOR) 20 MG tablet TAKE ONE TABLET EVERY DAY  . spironolactone (ALDACTONE) 25 MG tablet TAKE ONE TABLET BY MOUTH EVERY DAY  . telmisartan (MICARDIS) 80 MG tablet Take 1 tablet by mouth daily.   No facility-administered encounter medications on file as of 07/14/2019.    Activities of Daily Living In your present state of health, do you have any difficulty performing the following activities: 07/14/2019  Hearing? N  Vision? N  Difficulty concentrating or making decisions? N  Walking or climbing stairs? N  Dressing or bathing? N  Doing errands, shopping? N  Preparing Food and eating ? N  Using the Toilet? N  In the past six months, have you accidently leaked urine? N  Do you have problems with loss of bowel control? N  Managing your Medications? N  Managing your Finances? N  Housekeeping or managing your Housekeeping? N  Some recent data might be hidden    Patient Care Team: Jerrol Banana., MD as PCP - General (Family Medicine) Corey Skains, MD as Consulting Physician (Cardiology) Vladimir Crofts, MD as Consulting Physician (Neurology)   Assessment:   This is a routine wellness examination for Pedro Swanson.  Exercise Activities and Dietary recommendations Current Exercise Habits: The patient does not participate in regular exercise at present, Exercise limited by: None identified  Goals    . DIET - INCREASE WATER INTAKE     Recommend to drink at least 6-8 8oz glasses of water per day.       Fall Risk: Fall Risk  07/14/2019 07/07/2018 07/07/2018 06/12/2017 06/12/2016  Falls in the past year? 0 0  0 No No  Number falls in past yr: 0 - - - -  Injury with Fall? 0 - - - -    FALL RISK PREVENTION PERTAINING TO THE HOME:  Any stairs in or around the home? No  If so, are there any without handrails? N/A  Home free of loose throw rugs in walkways, pet beds, electrical cords, etc? Yes  Adequate lighting in your home to reduce risk of falls? Yes   ASSISTIVE DEVICES UTILIZED TO PREVENT FALLS:  Life alert? No  Use of a cane, walker or w/c? No  Grab bars in the bathroom? No  Shower chair or bench in shower? Yes  Elevated toilet seat or  a handicapped toilet? Yes   TIMED UP AND GO:  Was the test performed? No .    Depression Screen PHQ 2/9 Scores 07/14/2019 07/07/2018 07/07/2018 06/12/2017  PHQ - 2 Score 0 0 0 0  PHQ- 9 Score - 2 - 3    Cognitive Function: Declined today.     6CIT Screen 06/12/2017  What Year? 0 points  What month? 0 points  What time? 0 points  Count back from 20 0 points  Months in reverse 0 points  Repeat phrase 6 points  Total Score 6    Immunization History  Administered Date(s) Administered  . Fluad Quad(high Dose 65+) 02/16/2019  . Influenza, High Dose Seasonal PF 06/12/2017, 05/06/2018  . Pneumococcal Polysaccharide-23 02/16/2019  . Tdap 03/31/2008  . Zoster 11/04/2012    Qualifies for Shingles Vaccine? Yes  Zostavax completed 11/04/12. Due for Shingrix. Pt has been advised to call insurance company to determine out of pocket expense. Advised may also receive vaccine at local pharmacy or Health Dept. Verbalized acceptance and understanding.  Tdap: Although this vaccine is not a covered service during a Wellness Exam, does the patient still wish to receive this vaccine today?  No . Advised may receive this vaccine at local pharmacy or Health Dept. Aware to provide a copy of the vaccination record if obtained from local pharmacy or Health Dept. Verbalized acceptance and understanding.  Flu Vaccine: Up to date  Pneumococcal Vaccine: Up to  date  Screening Tests Health Maintenance  Topic Date Due  . TETANUS/TDAP  07/13/2020 (Originally 03/31/2018)  . Fecal DNA (Cologuard)  08/12/2019  . PNA vac Low Risk Adult (2 of 2 - PCV13) 02/16/2020  . INFLUENZA VACCINE  Completed  . Hepatitis C Screening  Completed   Cancer Screenings:  Colorectal Screening: Cologuard completed 08/11/16. Repeat every 3 years.   Lung Cancer Screening: (Low Dose CT Chest recommended if Age 7-80 years, 30 pack-year currently smoking OR have quit w/in 15years.) does qualify however has this scheduled in 4 weeks.  Additional Screening:  Hepatitis C Screening: Up to date  Vision Screening: Recommended annual ophthalmology exams for early detection of glaucoma and other disorders of the eye.  Dental Screening: Recommended annual dental exams for proper oral hygiene  Community Resource Referral:  CRR required this visit?  No        Plan:  I have personally reviewed and addressed the Medicare Annual Wellness questionnaire and have noted the following in the patient's chart:  A. Medical and social history B. Use of alcohol, tobacco or illicit drugs  C. Current medications and supplements D. Functional ability and status E.  Nutritional status F.  Physical activity G. Advance directives H. List of other physicians I.  Hospitalizations, surgeries, and ER visits in previous 12 months J.  [place] such as hearing and vision if needed, cognitive and depression L. Referrals and appointments   In addition, I have reviewed and discussed with patient certain preventive protocols, quality metrics, and best practice recommendations. A written personalized care plan for preventive services as well as general preventive health recommendations were provided to patient.   Glendora Score, [place]  [id number] Nurse Health Advisor   Nurse Notes: None.

## 2019-07-13 NOTE — Progress Notes (Signed)
Patient: Pedro Swanson, Male    DOB: [birth year], 69 y.o.   MRN: [id number] Visit Date: 07/14/2019  Today's Provider: Wilhemena Durie, MD   Chief Complaint  Patient presents with  . Annual Exam   Subjective:     Patient had AWV with NHA today at 8:40.   Complete Physical Breighton Swanson is a 69 y.o. male. He feels well. He reports exercising none. He reports he is sleeping poorly.  He has had both Covid vaccines.  He thinks he is sleeping poorly in part to urinary urgency and frequency.  He has some nocturia. He now takes vitamin C 500 units daily, vitamin D3 2000 units daily, vitamin B12 shots per neurology.  -----------------------------------------------------------  Colonoscopy: 12/09/2013  Review of Systems  Constitutional: Negative.   HENT: Negative.   Eyes: Negative.   Respiratory: Negative.   Cardiovascular: Negative.   Gastrointestinal: Negative.   Endocrine: Negative.   Genitourinary: Positive for urgency.  Musculoskeletal: Positive for gait problem.       More of a shuffling gait in recent years.  Allergic/Immunologic: Negative.   Neurological: Positive for dizziness.  Hematological: Negative.   Psychiatric/Behavioral: Negative.     Social History   Socioeconomic History  . Marital status: Married    Spouse name: Not on file  . Number of children: 2  . Years of education: Not on file  . Highest education level: Bachelor's degree (e.g., BA, AB, BS)  Occupational History  . Occupation: retired    Comment: paints on the side  Tobacco Use  . Smoking status: Former Smoker    Packs/day: 1.00    Years: 44.00    Pack years: 44.00    Types: Cigarettes    Quit date: 2012    Years since quitting: 9.1  . Smokeless tobacco: Never Used  Substance and Sexual Activity  . Alcohol use: Yes    Alcohol/week: 7.0 standard drinks    Types: 7 Shots of liquor per week    Comment: half drink daily  . Drug use: No  . Sexual activity: Not on file   Other Topics Concern  . Not on file  Social History Narrative  . Not on file   Social Determinants of Health   Financial Resource Strain: Low Risk   . Difficulty of Paying Living Expenses: Not hard at all  Food Insecurity: No Food Insecurity  . Worried About Charity fundraiser in the Last Year: Never true  . Ran Out of Food in the Last Year: Never true  Transportation Needs: No Transportation Needs  . Lack of Transportation (Medical): No  . Lack of Transportation (Non-Medical): No  Physical Activity: Inactive  . Days of Exercise per Week: 0 days  . Minutes of Exercise per Session: 0 min  Stress: No Stress Concern Present  . Feeling of Stress : Not at all  Social Connections: Somewhat Isolated  . Frequency of Communication with Friends and Family: More than three times a week  . Frequency of Social Gatherings with Friends and Family: More than three times a week  . Attends Religious Services: Never  . Active Member of Clubs or Organizations: No  . Attends Archivist Meetings: Never  . Marital Status: Married  Human resources officer Violence: Not At Risk  . Fear of Current or Ex-Partner: No  . Emotionally Abused: No  . Physically Abused: No  . Sexually Abused: No    Past Medical History:  Diagnosis Date  .  Cataract   . Hyperlipidemia   . Hypertension      Patient Active Problem List   Diagnosis Date Noted  . 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 Hypercholesteremia 01/18/2015  . BP (high blood pressure) 01/18/2015  . Osteoarthrosis 01/18/2015  . 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 Compulsive tobacco user syndrome 01/18/2015  . Avitaminosis D 01/18/2015    Past Surgical History:  Procedure Laterality Date  . CATARACT EXTRACTION     right eye    His family history includes CAD in his father; Hypertension in his father.   Current Outpatient Medications:  .  amLODipine (NORVASC) 10 MG  tablet, Take 1 tablet (10 mg total) by mouth daily., Disp: 90 tablet, Rfl: 3 .  ascorbic acid (VITAMIN C) 500 MG tablet, Take 500 mg by mouth daily., Disp: , Rfl:  .  aspirin EC 81 MG tablet, Take 81 mg by mouth. Three times weekly, Disp: , Rfl:  .  Cholecalciferol 25 MCG (1000 UT) capsule, Take 1,000 Units by mouth daily. , Disp: , Rfl:  .  cyanocobalamin (,VITAMIN B-12,) 1000 MCG/ML injection, Inject 1,000 mcg into the muscle every 30 (thirty) days. , Disp: , Rfl:  .  rosuvastatin (CRESTOR) 20 MG tablet, TAKE ONE TABLET EVERY DAY, Disp: 90 tablet, Rfl: 3 .  spironolactone (ALDACTONE) 25 MG tablet, TAKE ONE TABLET BY MOUTH EVERY DAY, Disp: , Rfl:  .  telmisartan (MICARDIS) 80 MG tablet, Take 1 tablet by mouth daily., Disp: , Rfl:   Patient Care Team: Jerrol Banana., MD as PCP - General (Family Medicine) Corey Skains, MD as Consulting Physician (Cardiology) Vladimir Crofts, MD as Consulting Physician (Neurology)     Objective:    Vitals: BP 108/71 (BP Location: Right Arm, Patient Position: Sitting, Cuff Size: Large)   Pulse 77   Temp 98.2 F (36.8 C) (Other (Comment))   Resp 18   Ht 5\' 9"  (1.753 m)   Wt 189 lb (85.7 kg)   SpO2 98%   BMI 27.91 kg/m   Physical Exam Vitals reviewed.  Constitutional:      Appearance: He is well-developed and normal weight.  HENT:     Head: Normocephalic and atraumatic.     Right Ear: Tympanic membrane and external ear normal.     Left Ear: Tympanic membrane and external ear normal.     Nose: Nose normal.     Mouth/Throat:     Pharynx: Oropharynx is clear.  Eyes:     General: No scleral icterus.    Conjunctiva/sclera: Conjunctivae normal.  Neck:     Thyroid: No thyromegaly.  Cardiovascular:     Rate and Rhythm: Normal rate and regular rhythm.     Heart sounds: Normal heart sounds.  Pulmonary:     Effort: Pulmonary effort is normal.     Breath sounds: Normal breath sounds.  Abdominal:     Palpations: Abdomen is soft.      Comments: Apparent ventral hernia noted.  Genitourinary:    Penis: Normal.      Testes: Normal.     Prostate: Normal.     Rectum: Normal.  Musculoskeletal:        General: Normal range of motion.     Comments: Left clavicle obviously has had had AC severe sprain in past.  Skin:    General: Skin is warm and dry.     Comments: Chronic  Cyst right upper back.  Neurological:  Mental Status: He is alert and oriented to person, place, and time. Mental status is at baseline.     Comments: He does have a shuffling gait.  Psychiatric:        Mood and Affect: Mood normal.        Behavior: Behavior normal.        Thought Content: Thought content normal.        Judgment: Judgment normal.     Activities of Daily Living In your present state of health, do you have any difficulty performing the following activities: 07/14/2019  Hearing? N  Vision? N  Difficulty concentrating or making decisions? N  Walking or climbing stairs? N  Dressing or bathing? N  Doing errands, shopping? N  Preparing Food and eating ? N  Using the Toilet? N  In the past six months, have you accidently leaked urine? N  Do you have problems with loss of bowel control? N  Managing your Medications? N  Managing your Finances? N  Housekeeping or managing your Housekeeping? N  Some recent data might be hidden    Fall Risk Assessment Fall Risk  07/14/2019 07/07/2018 07/07/2018 06/12/2017 06/12/2016  Falls in the past year? 0 0 0 No No  Number falls in past yr: 0 - - - -  Injury with Fall? 0 - - - -     Depression Screen PHQ 2/9 Scores 07/14/2019 07/07/2018 07/07/2018 06/12/2017  PHQ - 2 Score 0 0 0 0  PHQ- 9 Score - 2 - 3    6CIT Screen 06/12/2017  What Year? 0 points  What month? 0 points  What time? 0 points  Count back from 20 0 points  Months in reverse 0 points  Repeat phrase 6 points  Total Score 6       Assessment & Plan:    Annual Physical Reviewed patient's Family Medical History Reviewed and  updated list of patient's medical providers Assessment of cognitive impairment was done Assessed patient's functional ability Established a written schedule for health screening [place] Completed and Reviewed  Exercise Activities and Dietary recommendations Goals    . DIET - INCREASE WATER INTAKE     Recommend to drink at least 6-8 8oz glasses of water per day.       Immunization History  Administered Date(s) Administered  . Fluad Quad(high Dose 65+) 02/16/2019  . Influenza, High Dose Seasonal PF 06/12/2017, 05/06/2018  . Pneumococcal Polysaccharide-23 02/16/2019  . Tdap 03/31/2008  . Zoster 11/04/2012    Health Maintenance  Topic Date Due  . TETANUS/TDAP  07/13/2020 (Originally 03/31/2018)  . Fecal DNA (Cologuard)  08/12/2019  . PNA vac Low Risk Adult (2 of 2 - PCV13) 02/16/2020  . INFLUENZA VACCINE  Completed  . Hepatitis C Screening  Completed     Discussed health benefits of physical activity, and encouraged him to engage in regular exercise appropriate for his age and condition.    --------------------------------------------------------------------  1. Annual physical exam  - CBC - Comprehensive metabolic panel - Lipid panel - TSH - PSA  2. Essential hypertension  - CBC - Comprehensive metabolic panel - Lipid panel - TSH - PSA  3. Hypercholesteremia  - CBC - Comprehensive metabolic panel - Lipid panel - TSH - PSA  4. Benign prostatic hyperplasia, unspecified whether lower urinary tract symptoms present  - CBC - Comprehensive metabolic panel - Lipid panel - TSH - PSA  5. Prostate cancer screening  - PSA  6. Urinary urgency/BPH Try tamsulosin  0.4 mg daily.  - tamsulosin (FLOMAX) 0.4 MG CAPS capsule; Take 1 capsule (0.4 mg total) by mouth daily.  Dispense: 30 capsule; Refill: 3 7.  Abnormal gait  8.  B12 deficiency  Follow up in 2 months.    I,Cormac Wint,acting as a scribe for Wilhemena Durie, MD.,have  documented all relevant documentation on the behalf of Wilhemena Durie, MD,as directed by  Wilhemena Durie, MD while in the presence of Wilhemena Durie, MD.

## 2019-07-14 ENCOUNTER — Ambulatory Visit (INDEPENDENT_AMBULATORY_CARE_PROVIDER_SITE_OTHER): Payer: Medicare HMO | Admitting: Family Medicine

## 2019-07-14 ENCOUNTER — Ambulatory Visit (INDEPENDENT_AMBULATORY_CARE_PROVIDER_SITE_OTHER): Payer: Medicare HMO

## 2019-07-14 ENCOUNTER — Other Ambulatory Visit: Payer: Self-pay

## 2019-07-14 ENCOUNTER — Encounter: Payer: Self-pay | Admitting: Family Medicine

## 2019-07-14 ENCOUNTER — Ambulatory Visit: Payer: Medicare HMO

## 2019-07-14 VITALS — BP 108/71 | HR 77 | Temp 98.2°F | Resp 18 | Ht 69.0 in | Wt 189.0 lb

## 2019-07-14 DIAGNOSIS — E7800 Pure hypercholesterolemia, unspecified: Secondary | ICD-10-CM | POA: Diagnosis not present

## 2019-07-14 DIAGNOSIS — N400 Enlarged prostate without lower urinary tract symptoms: Secondary | ICD-10-CM | POA: Diagnosis not present

## 2019-07-14 DIAGNOSIS — R2689 Other abnormalities of gait and mobility: Secondary | ICD-10-CM

## 2019-07-14 DIAGNOSIS — Z125 Encounter for screening for malignant neoplasm of prostate: Secondary | ICD-10-CM | POA: Diagnosis not present

## 2019-07-14 DIAGNOSIS — E559 Vitamin D deficiency, unspecified: Secondary | ICD-10-CM

## 2019-07-14 DIAGNOSIS — I10 Essential (primary) hypertension: Secondary | ICD-10-CM

## 2019-07-14 DIAGNOSIS — E538 Deficiency of other specified B group vitamins: Secondary | ICD-10-CM

## 2019-07-14 DIAGNOSIS — Z0000 Encounter for general adult medical examination without abnormal findings: Secondary | ICD-10-CM

## 2019-07-14 DIAGNOSIS — R3915 Urgency of urination: Secondary | ICD-10-CM

## 2019-07-14 MED ORDER — TAMSULOSIN HCL 0.4 MG PO CAPS: 0.4000 mg | ORAL_CAPSULE | Freq: Every day | ORAL | 3 refills | Status: DC

## 2019-07-14 NOTE — Patient Instructions (Signed)
Pedro Swanson , Thank you for taking time to come for your Medicare Wellness Visit. I appreciate your ongoing commitment to your health goals. Please review the following plan we discussed and let me know if I can assist you in the future.   Screening recommendations/referrals: Colonoscopy: Cologuard up to date, due 08/12/19. Recommended yearly ophthalmology/optometry visit for glaucoma screening and checkup Recommended yearly dental visit for hygiene and checkup  Vaccinations: Influenza vaccine: Up to date Pneumococcal vaccine: Up to date, Prevnar 33 due 02/16/20 Tdap vaccine: Pt declines today.  Shingles vaccine: Pt declines today.     Advanced directives: Advance directive discussed with you today. Even though you declined this today please call our office should you change your mind and we can give you the proper paperwork for you to fill out.  Conditions/risks identified: Continue increasing water intake to 6-8 8 oz glasses a day.  Next appointment: 9:40 AM today with Dr Rosanna Randy   Preventive Care 71 Years and Older, Male Preventive care refers to lifestyle choices and visits with your health care provider that can promote health and wellness. What does preventive care include?  A yearly physical exam. This is also called an annual well check.  Dental exams once or twice a year.  Routine eye exams. Ask your health care provider how often you should have your eyes checked.  Personal lifestyle choices, including:  Daily care of your teeth and gums.  Regular physical activity.  Eating a healthy diet.  Avoiding tobacco and drug use.  Limiting alcohol use.  Practicing safe sex.  Taking low doses of aspirin every day.  Taking vitamin and mineral supplements as recommended by your health care provider. What happens during an annual well check? The services and screenings done by your health care provider during your annual well check will depend on your age, overall health,  lifestyle risk factors, and family history of disease. Counseling  Your health care provider may ask you questions about your:  Alcohol use.  Tobacco use.  Drug use.  Emotional well-being.  Home and relationship well-being.  Sexual activity.  Eating habits.  History of falls.  Memory and ability to understand (cognition).  Work and work Statistician. Screening  You may have the following tests or measurements:  Height, weight, and BMI.  Blood pressure.  Lipid and cholesterol levels. These may be checked every 5 years, or more frequently if you are over 71 years old.  Skin check.  Lung cancer screening. You may have this screening every year starting at age 71 if you have a 30-pack-year history of smoking and currently smoke or have quit within the past 15 years.  Fecal occult blood test (FOBT) of the stool. You may have this test every year starting at age 71.  Flexible sigmoidoscopy or colonoscopy. You may have a sigmoidoscopy every 5 years or a colonoscopy every 10 years starting at age 71.  Prostate cancer screening. Recommendations will vary depending on your family history and other risks.  Hepatitis C blood test.  Hepatitis B blood test.  Sexually transmitted disease (STD) testing.  Diabetes screening. This is done by checking your blood sugar (glucose) after you have not eaten for a while (fasting). You may have this done every 1-3 years.  Abdominal aortic aneurysm (AAA) screening. You may need this if you are a current or former smoker.  Osteoporosis. You may be screened starting at age 60 if you are at high risk. Talk with your health care provider about your  test results, treatment options, and if necessary, the need for more tests. Vaccines  Your health care provider may recommend certain vaccines, such as:  Influenza vaccine. This is recommended every year.  Tetanus, diphtheria, and acellular pertussis (Tdap, Td) vaccine. You may need a Td booster  every 10 years.  Zoster vaccine. You may need this after age 80.  Pneumococcal 13-valent conjugate (PCV13) vaccine. One dose is recommended after age 24.  Pneumococcal polysaccharide (PPSV23) vaccine. One dose is recommended after age 13. Talk to your health care provider about which screenings and vaccines you need and how often you need them. This information is not intended to replace advice given to you by your health care provider. Make sure you discuss any questions you have with your health care provider. Document Released: 05/27/2015 Document Revised: 01/18/2016 Document Reviewed: 03/01/2015 Elsevier Interactive Patient Education  2017 [employer] Prevention in the Home Falls can cause injuries. They can happen to people of all ages. There are many things you can do to make your home safe and to help prevent falls. What can I do on the outside of my home?  Regularly fix the edges of walkways and driveways and fix any cracks.  Remove anything that might make you trip as you walk through a door, such as a raised step or threshold.  Trim any bushes or trees on the path to your home.  Use bright outdoor lighting.  Clear any walking paths of anything that might make someone trip, such as rocks or tools.  Regularly check to see if handrails are loose or broken. Make sure that both sides of any steps have handrails.  Any raised decks and porches should have guardrails on the edges.  Have any leaves, snow, or ice cleared regularly.  Use sand or salt on walking paths during winter.  Clean up any spills in your garage right away. This includes oil or grease spills. What can I do in the bathroom?  Use night lights.  Install grab bars by the toilet and in the tub and shower. Do not use towel bars as grab bars.  Use non-skid mats or decals in the tub or shower.  If you need to sit down in the shower, use a plastic, non-slip stool.  Keep the floor dry. Clean up any  water that spills on the floor as soon as it happens.  Remove soap buildup in the tub or shower regularly.  Attach bath mats securely with double-sided non-slip rug tape.  Do not have throw rugs and other things on the floor that can make you trip. What can I do in the bedroom?  Use night lights.  Make sure that you have a light by your bed that is easy to reach.  Do not use any sheets or blankets that are too big for your bed. They should not hang down onto the floor.  Have a firm chair that has side arms. You can use this for support while you get dressed.  Do not have throw rugs and other things on the floor that can make you trip. What can I do in the kitchen?  Clean up any spills right away.  Avoid walking on wet floors.  Keep items that you use a lot in easy-to-reach places.  If you need to reach something above you, use a strong step stool that has a grab bar.  Keep electrical cords out of the way.  Do not use floor polish or wax that  makes floors slippery. If you must use wax, use non-skid floor wax.  Do not have throw rugs and other things on the floor that can make you trip. What can I do with my stairs?  Do not leave any items on the stairs.  Make sure that there are handrails on both sides of the stairs and use them. Fix handrails that are broken or loose. Make sure that handrails are as long as the stairways.  Check any carpeting to make sure that it is firmly attached to the stairs. Fix any carpet that is loose or worn.  Avoid having throw rugs at the top or bottom of the stairs. If you do have throw rugs, attach them to the floor with carpet tape.  Make sure that you have a light switch at the top of the stairs and the bottom of the stairs. If you do not have them, ask someone to add them for you. What else can I do to help prevent falls?  Wear shoes that:  Do not have high heels.  Have rubber bottoms.  Are comfortable and fit you well.  Are closed  at the toe. Do not wear sandals.  If you use a stepladder:  Make sure that it is fully opened. Do not climb a closed stepladder.  Make sure that both sides of the stepladder are locked into place.  Ask someone to hold it for you, if possible.  Clearly mark and make sure that you can see:  Any grab bars or handrails.  First and last steps.  Where the edge of each step is.  Use tools that help you move around (mobility aids) if they are needed. These include:  Canes.  Walkers.  Scooters.  Crutches.  Turn on the lights when you go into a dark area. Replace any light bulbs as soon as they burn out.  Set up your furniture so you have a clear path. Avoid moving your furniture around.  If any of your floors are uneven, fix them.  If there are any pets around you, be aware of where they are.  Review your medicines with your doctor. Some medicines can make you feel dizzy. This can increase your chance of falling. Ask your doctor what other things that you can do to help prevent falls. This information is not intended to replace advice given to you by your health care provider. Make sure you discuss any questions you have with your health care provider. Document Released: 02/24/2009 Document Revised: 10/06/2015 Document Reviewed: 06/04/2014 Elsevier Interactive Patient Education  2017 [employer].

## 2019-07-16 LAB — LIPID PANEL
Chol/HDL Ratio: 2.6 ratio (ref 0.0–5.0)
Cholesterol, Total: 125 mg/dL (ref 100–199)
HDL: 48 mg/dL (ref >39)
LDL Chol Calc (NIH): 59 mg/dL (ref 0–99)
Triglycerides: 94 mg/dL (ref 0–149)
VLDL Cholesterol Cal: 18 mg/dL (ref 5–40)

## 2019-07-16 LAB — COMPREHENSIVE METABOLIC PANEL
ALT: 13 IU/L (ref 0–44)
AST: 16 IU/L (ref 0–40)
Albumin/Globulin Ratio: 2.1 (ref 1.2–2.2)
Albumin: 4.9 g/dL — ABNORMAL HIGH (ref 3.8–4.8)
Alkaline Phosphatase: 57 IU/L (ref 39–117)
BUN/Creatinine Ratio: 17 (ref 10–24)
BUN: 19 mg/dL (ref 8–27)
Bilirubin Total: 0.4 mg/dL (ref 0.0–1.2)
CO2: 21 mmol/L (ref 20–29)
Calcium: 9.8 mg/dL (ref 8.6–10.2)
Chloride: 101 mmol/L (ref 96–106)
Creatinine, Ser: 1.11 mg/dL (ref 0.76–1.27)
GFR calc Af Amer: 77 mL/min/{1.73_m2} (ref >59)
GFR calc non Af Amer: 67 mL/min/{1.73_m2} (ref >59)
Globulin, Total: 2.3 g/dL (ref 1.5–4.5)
Glucose: 103 mg/dL — ABNORMAL HIGH (ref 65–99)
Potassium: 5.1 mmol/L (ref 3.5–5.2)
Sodium: 137 mmol/L (ref 134–144)
Total Protein: 7.2 g/dL (ref 6.0–8.5)

## 2019-07-16 LAB — CBC
Hematocrit: 42.1 % (ref 37.5–51.0)
Hemoglobin: 14.2 g/dL (ref 13.0–17.7)
MCH: 30.6 pg (ref 26.6–33.0)
MCHC: 33.7 g/dL (ref 31.5–35.7)
MCV: 91 fL (ref 79–97)
Platelets: 247 10*3/uL (ref 150–450)
RBC: 4.64 x10E6/uL (ref 4.14–5.80)
RDW: 11.7 % (ref 11.6–15.4)
WBC: 6.4 10*3/uL (ref 3.4–10.8)

## 2019-07-16 LAB — TSH: TSH: 1.9 u[IU]/mL (ref 0.450–4.500)

## 2019-07-16 LAB — PSA: Prostate Specific Ag, Serum: 2.1 ng/mL (ref 0.0–4.0)

## 2019-07-17 ENCOUNTER — Telehealth: Payer: Self-pay | Admitting: *Deleted

## 2019-07-17 NOTE — Telephone Encounter (Signed)
LMOVM for pt to return call. Okay for [facility] triage to give results to patient.

## 2019-07-17 NOTE — Telephone Encounter (Signed)
-----   Message from Jerrol Banana., MD sent at 07/16/2019  4:40 PM EST ----- Labs in normal range.

## 2019-07-22 ENCOUNTER — Telehealth: Payer: Self-pay | Admitting: Family Medicine

## 2019-07-22 NOTE — Telephone Encounter (Signed)
Comments to Patient Seen by patient Pedro Swanson on 07/22/2019 7:07 AM EST

## 2019-07-22 NOTE — Telephone Encounter (Signed)
Patient has problems swallowing capsules  and his daughter-in-law who is a pharmacist has suggested that we change his Flomax to Cardura XL.

## 2019-07-24 MED ORDER — DOXAZOSIN MESYLATE ER 4 MG PO TB24: 4.0000 mg | ORAL_TABLET | Freq: Every day | ORAL | 3 refills | Status: DC

## 2019-07-24 NOTE — Telephone Encounter (Signed)
Dr. Rosanna Randy, okay to change.

## 2019-07-27 NOTE — Telephone Encounter (Signed)
Yes--lower dose daily

## 2019-07-27 NOTE — Telephone Encounter (Signed)
Done

## 2019-08-04 ENCOUNTER — Telehealth: Payer: Self-pay | Admitting: *Deleted

## 2019-08-04 NOTE — Telephone Encounter (Signed)
Left message for patient to notify them that it is time to schedule annual low dose lung cancer screening CT scan. Instructed patient to call back to verify information prior to the scan being scheduled.  

## 2019-08-11 ENCOUNTER — Ambulatory Visit
Admission: RE | Admit: 2019-08-11 | Discharge: 2019-08-11 | Disposition: A | Discharge status: Home / Self Care | Payer: Medicare HMO | Source: Ambulatory Visit | Attending: Oncology | Admitting: Oncology

## 2019-08-11 ENCOUNTER — Other Ambulatory Visit: Payer: Self-pay

## 2019-08-11 DIAGNOSIS — Z87891 Personal history of nicotine dependence: Secondary | ICD-10-CM

## 2019-08-13 ENCOUNTER — Encounter: Payer: Self-pay | Admitting: *Deleted

## 2019-09-11 NOTE — Progress Notes (Signed)
Trena Platt Cummings,acting as a scribe for Wilhemena Durie, MD.,have documented all relevant documentation on the behalf of Wilhemena Durie, MD,as directed by  Wilhemena Durie, MD while in the presence of Wilhemena Durie, MD.  Established patient visit   Patient: Pedro Swanson   DOB: 1950/11/23   68 y.o. Male  MRN: [id number] Visit Date: 09/16/2019  Today's healthcare provider: Wilhemena Durie, MD   Chief Complaint  Patient presents with  . Urinary Urgency    follow up    Subjective    HPI  Patient has not started taking tamsulosin yet because he was recently stopped taking amlodipine and one of the side effects of tamsulosin is lowering blood pressure and he wanted to make sure that it would not effect him.  It is of note that he is lost 25 pounds with purposeful dieting over the past few months.  He also started B12 injections and feels more energetic between the B12 and the weight loss Urinary urgency/BPH From 07/14/2019-Try tamsulosin 0.4 mg daily.  He has not been taking this and his symptoms are mild according to the patient.   Patient Active Problem List   Diagnosis Date Noted  . 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 Hypercholesteremia 01/18/2015  . BP (high blood pressure) 01/18/2015  . Osteoarthrosis 01/18/2015  . 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 Compulsive tobacco user syndrome 01/18/2015  . Avitaminosis D 01/18/2015       Medications: Outpatient Medications Prior to Visit  Medication Sig  . ascorbic acid (VITAMIN C) 500 MG tablet Take 500 mg by mouth daily.  Marland Kitchen aspirin EC 81 MG tablet Take 81 mg by mouth. Three times weekly  . Cholecalciferol 25 MCG (1000 UT) capsule Take 1,000 Units by mouth daily.   . cyanocobalamin (,VITAMIN B-12,) 1000 MCG/ML injection Inject 1,000 mcg into the muscle every 30 (thirty) days.   Marland Kitchen doxazosin (CARDURA XL) 4 MG 24 hr tablet Take 1  tablet (4 mg total) by mouth daily with breakfast.  . rosuvastatin (CRESTOR) 20 MG tablet TAKE ONE TABLET EVERY DAY  . spironolactone (ALDACTONE) 25 MG tablet TAKE ONE TABLET BY MOUTH EVERY DAY  . telmisartan (MICARDIS) 80 MG tablet Take 1 tablet by mouth daily.  Marland Kitchen amLODipine (NORVASC) 10 MG tablet Take 1 tablet (10 mg total) by mouth daily. (Patient not taking: Reported on 09/16/2019)  . tamsulosin (FLOMAX) 0.4 MG CAPS capsule Take 1 capsule (0.4 mg total) by mouth daily. (Patient not taking: Reported on 09/16/2019)   No facility-administered medications prior to visit.    Review of Systems  Constitutional: Negative for appetite change, chills and fever.  HENT: Negative.   Eyes: Negative.   Respiratory: Negative for chest tightness, shortness of breath and wheezing.   Cardiovascular: Negative for chest pain and palpitations.  Gastrointestinal: Negative for abdominal pain, nausea and vomiting.  Endocrine: Negative.   Genitourinary: Positive for decreased urine volume.  Musculoskeletal: Negative.   Allergic/Immunologic: Negative.   Hematological: Negative.   Psychiatric/Behavioral: Negative.     Last metabolic panel Lab Results  Component Value Date   GLUCOSE 103 (H) 07/15/2019   NA 137 07/15/2019   K 5.1 07/15/2019   CL 101 07/15/2019   CO2 21 07/15/2019   BUN 19 07/15/2019   CREATININE 1.11 07/15/2019   GFRNONAA 67 07/15/2019   GFRAA 77 07/15/2019   CALCIUM 9.8 07/15/2019   PROT 7.2 07/15/2019   ALBUMIN 4.9 (  H) 07/15/2019   LABGLOB 2.3 07/15/2019   AGRATIO 2.1 07/15/2019   BILITOT 0.4 07/15/2019   ALKPHOS 57 07/15/2019   AST 16 07/15/2019   ALT 13 07/15/2019   ANIONGAP 8 05/28/2014      Objective    BP 135/66 (BP Location: Right Arm, Patient Position: Sitting, Cuff Size: Normal)   Pulse 73   Temp (!) 96.6 F (35.9 C) (Temporal)   Ht 5\' 9"  (1.753 m)   Wt 176 lb (79.8 kg)   BMI 25.99 kg/m  BP Readings from Last 3 Encounters:  09/16/19 135/66  07/14/19 108/71    02/16/19 136/68   Wt Readings from Last 3 Encounters:  09/16/19 176 lb (79.8 kg)  08/11/19 175 lb (79.4 kg)  07/14/19 189 lb (85.7 kg)      Physical Exam Vitals reviewed.  Constitutional:      Appearance: He is well-developed and normal weight.  HENT:     Head: Normocephalic and atraumatic.     Right Ear: Tympanic membrane and external ear normal.     Left Ear: Tympanic membrane and external ear normal.     Nose: Nose normal.     Mouth/Throat:     Pharynx: Oropharynx is clear.  Eyes:     General: No scleral icterus.    Conjunctiva/sclera: Conjunctivae normal.  Neck:     Thyroid: No thyromegaly.  Cardiovascular:     Rate and Rhythm: Normal rate and regular rhythm.     Heart sounds: Normal heart sounds.  Pulmonary:     Effort: Pulmonary effort is normal.     Breath sounds: Normal breath sounds.  Abdominal:     Palpations: Abdomen is soft.  Musculoskeletal:     Comments: Left clavicle obviously has had had AC severe sprain in past.  Skin:    General: Skin is warm and dry.     Comments: Chronic  Cyst right upper back.  Neurological:     Mental Status: He is alert and oriented to person, place, and time. Mental status is at baseline.     Comments: He does have a shuffling gait.  Psychiatric:        Mood and Affect: Mood normal.        Behavior: Behavior normal.        Thought Content: Thought content normal.        Judgment: Judgment normal.       No results found for any visits on 09/16/19.  Assessment & Plan    1. Essential hypertension Good control.  Might need to adjust as his weight loss has been significant.  He is on amlodipine telmisartan and Aldactone.  2. Arteriosclerotic cardiovascular disease (ASCVD) All risk factors treated Check lipids in the future on rosuvastatin.  Could be better with dietary change 3. Presbycusis of both ears   4. Benign fibroma of prostateBPH Consider tamsulosin for symptoms  5. Parkinson's disease ([place]) Followed by  neurology  6. B12 deficiency Patient feels much better since starting injection    No follow-ups on file.         Meekah Math Cranford Mon, MD  [facility] [phone] (phone) [phone] (fax)  [place]

## 2019-09-16 ENCOUNTER — Other Ambulatory Visit: Payer: Self-pay

## 2019-09-16 ENCOUNTER — Encounter: Payer: Self-pay | Admitting: Family Medicine

## 2019-09-16 ENCOUNTER — Ambulatory Visit (INDEPENDENT_AMBULATORY_CARE_PROVIDER_SITE_OTHER): Payer: Medicare HMO | Admitting: Family Medicine

## 2019-09-16 VITALS — BP 135/66 | HR 73 | Temp 96.6°F | Ht 69.0 in | Wt 176.0 lb

## 2019-09-16 DIAGNOSIS — I10 Essential (primary) hypertension: Secondary | ICD-10-CM

## 2019-09-16 DIAGNOSIS — H9113 Presbycusis, bilateral: Secondary | ICD-10-CM

## 2019-09-16 DIAGNOSIS — I2510 Atherosclerotic heart disease of native coronary artery without angina pectoris: Secondary | ICD-10-CM | POA: Diagnosis not present

## 2019-09-16 DIAGNOSIS — E538 Deficiency of other specified B group vitamins: Secondary | ICD-10-CM

## 2019-09-16 DIAGNOSIS — N400 Enlarged prostate without lower urinary tract symptoms: Secondary | ICD-10-CM | POA: Diagnosis not present

## 2019-09-16 DIAGNOSIS — G20 Parkinson's disease: Secondary | ICD-10-CM

## 2019-11-27 ENCOUNTER — Other Ambulatory Visit: Payer: Self-pay

## 2019-11-27 ENCOUNTER — Encounter: Payer: Self-pay | Admitting: Physician Assistant

## 2019-11-27 ENCOUNTER — Ambulatory Visit: Payer: Medicare HMO | Admitting: Physician Assistant

## 2019-11-27 VITALS — BP 129/77 | HR 78 | Temp 98.3°F | Ht 70.0 in | Wt 176.0 lb

## 2019-11-27 DIAGNOSIS — G8929 Other chronic pain: Secondary | ICD-10-CM | POA: Diagnosis not present

## 2019-11-27 DIAGNOSIS — S43102S Unspecified dislocation of left acromioclavicular joint, sequela: Secondary | ICD-10-CM | POA: Diagnosis not present

## 2019-11-27 DIAGNOSIS — M25512 Pain in left shoulder: Secondary | ICD-10-CM | POA: Diagnosis not present

## 2019-11-27 MED ORDER — HYDROCODONE-ACETAMINOPHEN 5-325 MG PO TABS: 1.0000 | ORAL_TABLET | Freq: Two times a day (BID) | ORAL | 0 refills | Status: AC

## 2019-11-27 NOTE — Progress Notes (Signed)
Established patient visit   Patient: Pedro Swanson   DOB: [birth year]   69 y.o. Male  MRN: [id number] Visit Date: 11/27/2019  Today's healthcare provider: Trinna Post, PA-C   Chief Complaint  Patient presents with  . Shoulder Pain   Subjective    Shoulder Pain  The pain is present in the left shoulder.    Patient reports that he has had pain in his left shoulder X 2 weeks. He reports history of third degree AC separation 07/2008 after incident building porch. Elected to defer surgical repair at that time and has largely functioned normally until 2 weeks ago when he has been experiencing increased pain.   He reports that on a pain scale of 1-10, his is an 8.      Medications: Outpatient Medications Prior to Visit  Medication Sig  . ascorbic acid (VITAMIN C) 500 MG tablet Take 500 mg by mouth daily.  Marland Kitchen aspirin EC 81 MG tablet Take 81 mg by mouth. Three times weekly  . Cholecalciferol 25 MCG (1000 UT) capsule Take 1,000 Units by mouth daily.   Marland Kitchen amLODipine (NORVASC) 10 MG tablet Take 1 tablet (10 mg total) by mouth daily. (Patient not taking: Reported on 09/16/2019)  . cyanocobalamin (,VITAMIN B-12,) 1000 MCG/ML injection Inject 1,000 mcg into the muscle every 30 (thirty) days.   Marland Kitchen doxazosin (CARDURA XL) 4 MG 24 hr tablet Take 1 tablet (4 mg total) by mouth daily with breakfast.  . rosuvastatin (CRESTOR) 20 MG tablet TAKE ONE TABLET EVERY DAY  . spironolactone (ALDACTONE) 25 MG tablet TAKE ONE TABLET BY MOUTH EVERY DAY  . tamsulosin (FLOMAX) 0.4 MG CAPS capsule Take 1 capsule (0.4 mg total) by mouth daily. (Patient not taking: Reported on 09/16/2019)  . telmisartan (MICARDIS) 80 MG tablet Take 1 tablet by mouth daily.   No facility-administered medications prior to visit.    Review of Systems    Objective    BP 129/77   Pulse 78   Temp 98.3 F (36.8 C)   Ht 5\' 10"  (1.778 m)   Wt 176 lb (79.8 kg)   BMI 25.25 kg/m    Physical Exam Constitutional:       Appearance: Normal appearance.  Musculoskeletal:     Right shoulder: Normal.     Left shoulder: Deformity and tenderness present. Decreased range of motion.       Arms:  Skin:    General: Skin is warm and dry.  Neurological:     Mental Status: He is alert and oriented to person, place, and time. Mental status is at baseline.  Psychiatric:        Mood and Affect: Mood normal.        Behavior: Behavior normal.       No results found for any visits on 11/27/19.  Assessment & Plan    1. Chronic left shoulder pain  He is seeing orthopedics on Tuesday, will likely need surgical management. Pain management until then.  - HYDROcodone-acetaminophen (NORCO/VICODIN) 5-325 MG tablet; Take 1 tablet by mouth 2 times daily at 12 noon and 4 pm for 5 days.  Dispense: 10 tablet; Refill: 0  2. Separation of left acromioclavicular joint, sequela  - HYDROcodone-acetaminophen (NORCO/VICODIN) 5-325 MG tablet; Take 1 tablet by mouth 2 times daily at 12 noon and 4 pm for 5 days.  Dispense: 10 tablet; Refill: 0    Return if symptoms worsen or fail to improve.      I, Fabio Bering  Iva Lento, PA-C, have reviewed all documentation for this visit. The documentation on 12/01/19 for the exam, diagnosis, procedures, and orders are all accurate and complete.    Paulene Floor  [facility] [phone] (phone) [phone] (fax)  [place]

## 2020-01-11 DIAGNOSIS — M25512 Pain in left shoulder: Secondary | ICD-10-CM | POA: Insufficient documentation

## 2020-03-18 NOTE — Progress Notes (Signed)
I,April Miller,acting as a scribe for Wilhemena Durie, MD.,have documented all relevant documentation on the behalf of Wilhemena Durie, MD,as directed by  Wilhemena Durie, MD while in the presence of Wilhemena Durie, MD.   Established patient visit   Patient: Pedro Swanson   DOB: 1951-04-15   69 y.o. Male  MRN: [id number] Visit Date: 03/21/2020  Today's healthcare provider: Wilhemena Durie, MD   Chief Complaint  Patient presents with  . Follow-up  . Hypertension   Subjective    HPI  Patient has had his Covid vaccines and feels well.  Is done well with lifestyle changes over the past year or so. Hypertension, follow-up  BP Readings from Last 3 Encounters:  03/21/20 (!) 145/71  11/27/19 129/77  09/16/19 135/66   Wt Readings from Last 3 Encounters:  03/21/20 176 lb 9.6 oz (80.1 kg)  11/27/19 176 lb (79.8 kg)  09/16/19 176 lb (79.8 kg)     He was last seen for hypertension 6 months ago.  BP at that visit was 135/66. Management since that visit includes; Good control.  Might need to adjust as his weight loss has been significant.  He is on amlodipine telmisartan and Aldactone. He reports good compliance with treatment. He is not having side effects. none He is exercising. He is adherent to low salt diet.   Outside blood pressures are 134/70, 120/70.  He does not smoke.  Use of agents associated with hypertension: none.   --------------------------------------------------------------------  Benign fibroma of prostateBPH From 09/16/2019-Consider tamsulosin for symptoms.  B12 deficiency From 09/16/2019-Patient feels much better since starting injection.      Medications: Outpatient Medications Prior to Visit  Medication Sig  . ascorbic acid (VITAMIN C) 500 MG tablet Take 500 mg by mouth daily.  Marland Kitchen aspirin EC 81 MG tablet Take 81 mg by mouth. Three times weekly  . Cholecalciferol 25 MCG (1000 UT) capsule Take 1,000 Units by mouth daily.   .  cyanocobalamin (,VITAMIN B-12,) 1000 MCG/ML injection Inject 1,000 mcg into the muscle every 30 (thirty) days.   Marland Kitchen doxazosin (CARDURA XL) 4 MG 24 hr tablet Take 1 tablet (4 mg total) by mouth daily with breakfast.  . rosuvastatin (CRESTOR) 20 MG tablet TAKE ONE TABLET EVERY DAY  . spironolactone (ALDACTONE) 25 MG tablet TAKE ONE TABLET BY MOUTH EVERY DAY  . tamsulosin (FLOMAX) 0.4 MG CAPS capsule Take 1 capsule (0.4 mg total) by mouth daily.  Marland Kitchen telmisartan (MICARDIS) 80 MG tablet Take 1 tablet by mouth daily.  Marland Kitchen amLODipine (NORVASC) 10 MG tablet Take 1 tablet (10 mg total) by mouth daily. (Patient not taking: Reported on 09/16/2019)   No facility-administered medications prior to visit.    Review of Systems  Constitutional: Negative for appetite change, chills and fever.  Respiratory: Negative for chest tightness, shortness of breath and wheezing.   Cardiovascular: Negative for chest pain and palpitations.  Gastrointestinal: Negative for abdominal pain, nausea and vomiting.       Objective    BP (!) 145/71 (BP Location: Left Arm, Patient Position: Sitting, Cuff Size: Large)   Pulse 73   Temp 98.3 F (36.8 C) (Oral)   Resp 16   Ht 5\' 10"  (1.778 m)   Wt 176 lb 9.6 oz (80.1 kg)   SpO2 100%   BMI 25.34 kg/m  BP Readings from Last 3 Encounters:  03/21/20 (!) 145/71  11/27/19 129/77  09/16/19 135/66   Wt Readings from Last 3 Encounters:  03/21/20 176 lb 9.6 oz (80.1 kg)  11/27/19 176 lb (79.8 kg)  09/16/19 176 lb (79.8 kg)      Physical Exam Vitals reviewed.  Constitutional:      Appearance: He is well-developed and normal weight.  HENT:     Head: Normocephalic and atraumatic.     Right Ear: Tympanic membrane and external ear normal.     Left Ear: Tympanic membrane and external ear normal.     Nose: Nose normal.     Mouth/Throat:     Pharynx: Oropharynx is clear.  Eyes:     General: No scleral icterus.    Conjunctiva/sclera: Conjunctivae normal.  Neck:     Thyroid:  No thyromegaly.  Cardiovascular:     Rate and Rhythm: Normal rate and regular rhythm.     Heart sounds: Normal heart sounds.  Pulmonary:     Effort: Pulmonary effort is normal.     Breath sounds: Normal breath sounds.  Abdominal:     Palpations: Abdomen is soft.  Musculoskeletal:     Comments: Left clavicle obviously has had had AC severe sprain in past.  Skin:    General: Skin is warm and dry.  Neurological:     Mental Status: He is alert and oriented to person, place, and time. Mental status is at baseline.  Psychiatric:        Mood and Affect: Mood normal.        Behavior: Behavior normal.        Thought Content: Thought content normal.        Judgment: Judgment normal.       No results found for any visits on 03/21/20.  Assessment & Plan     1. Essential hypertension Good control.  Patient to check blood pressures at home.  Continue telmisartan amlodipine spironolactone and doxazosin  2. Need for influenza vaccination Patient has had Covid vaccines. - Flu Vaccine QUAD High Dose(Fluad)   Return in about 4 months (around 07/19/2020) for CPE.         Dvon Jiles Cranford Mon, MD  [facility] [phone] (phone) [phone] (fax)  [place]

## 2020-03-21 ENCOUNTER — Other Ambulatory Visit: Payer: Self-pay

## 2020-03-21 ENCOUNTER — Ambulatory Visit (INDEPENDENT_AMBULATORY_CARE_PROVIDER_SITE_OTHER): Payer: Medicare HMO | Admitting: Family Medicine

## 2020-03-21 ENCOUNTER — Encounter: Payer: Self-pay | Admitting: Family Medicine

## 2020-03-21 VITALS — BP 145/71 | HR 73 | Temp 98.3°F | Resp 16 | Ht 70.0 in | Wt 176.6 lb

## 2020-03-21 DIAGNOSIS — I10 Essential (primary) hypertension: Secondary | ICD-10-CM

## 2020-03-21 DIAGNOSIS — Z23 Encounter for immunization: Secondary | ICD-10-CM | POA: Diagnosis not present

## 2020-05-05 ENCOUNTER — Other Ambulatory Visit: Payer: Self-pay

## 2020-05-05 ENCOUNTER — Encounter: Payer: Self-pay | Admitting: Family Medicine

## 2020-05-05 ENCOUNTER — Ambulatory Visit: Payer: Medicare HMO | Admitting: Family Medicine

## 2020-05-05 VITALS — BP 121/72 | HR 82 | Temp 98.1°F | Resp 18 | Wt 180.0 lb

## 2020-05-05 DIAGNOSIS — R8281 Pyuria: Secondary | ICD-10-CM | POA: Diagnosis not present

## 2020-05-05 DIAGNOSIS — M5440 Lumbago with sciatica, unspecified side: Secondary | ICD-10-CM | POA: Diagnosis not present

## 2020-05-05 LAB — POCT URINALYSIS DIPSTICK
Blood, UA: NEGATIVE
Glucose, UA: NEGATIVE
Nitrite, UA: NEGATIVE
Protein, UA: NEGATIVE
Spec Grav, UA: 1.015 (ref 1.010–1.025)
Urobilinogen, UA: 0.2 E.U./dL
pH, UA: 7.5 (ref 5.0–8.0)

## 2020-05-05 MED ORDER — SULFAMETHOXAZOLE-TRIMETHOPRIM 800-160 MG PO TABS: 1.0000 | ORAL_TABLET | Freq: Two times a day (BID) | ORAL | 0 refills | Status: DC

## 2020-05-05 NOTE — Progress Notes (Signed)
Established patient visit   Patient: Pedro Swanson   DOB: [birth year]   69 y.o. Male  MRN: [id number] Visit Date: 05/05/2020  Today's healthcare provider: Vernie Murders, PA-C   Chief Complaint  Patient presents with  . Back Pain   Subjective    Back Pain This is a new problem. Episode onset: 2 days ago. The problem occurs intermittently. The pain is present in the lumbar spine (left side). Quality: sharp. Radiates to: left groin. Pertinent negatives include no abdominal pain, chest pain or fever. Treatments tried: Tylenol. The treatment provided moderate relief.      Past Medical History:  Diagnosis Date  . Cataract   . Hyperlipidemia   . Hypertension    Past Surgical History:  Procedure Laterality Date  . CATARACT EXTRACTION     right eye   Social History   Tobacco Use  . Smoking status: Former Smoker    Packs/day: 1.00    Years: 44.00    Pack years: 44.00    Types: Cigarettes    Quit date: 2012    Years since quitting: 9.9  . Smokeless tobacco: Never Used  Vaping Use  . Vaping Use: Never used  Substance Use Topics  . Alcohol use: Yes    Alcohol/week: 7.0 standard drinks    Types: 7 Shots of liquor per week    Comment: half drink daily  . Drug use: No   Family History  Problem Relation Age of Onset  . CAD Father   . Hypertension Father    No Known Allergies     Medications: Outpatient Medications Prior to Visit  Medication Sig  . amLODipine (NORVASC) 10 MG tablet Take 1 tablet (10 mg total) by mouth daily.  Marland Kitchen ascorbic acid (VITAMIN C) 500 MG tablet Take 500 mg by mouth daily.  Marland Kitchen aspirin EC 81 MG tablet Take 81 mg by mouth. Three times weekly  . Cholecalciferol 25 MCG (1000 UT) capsule Take 1,000 Units by mouth daily.   . cyanocobalamin (,VITAMIN B-12,) 1000 MCG/ML injection Inject 1,000 mcg into the muscle every 30 (thirty) days.   Marland Kitchen doxazosin (CARDURA XL) 4 MG 24 hr tablet Take 1 tablet (4 mg total) by mouth daily with breakfast.  .  rosuvastatin (CRESTOR) 20 MG tablet TAKE ONE TABLET EVERY DAY  . spironolactone (ALDACTONE) 25 MG tablet TAKE ONE TABLET BY MOUTH EVERY DAY  . tamsulosin (FLOMAX) 0.4 MG CAPS capsule Take 1 capsule (0.4 mg total) by mouth daily.  Marland Kitchen telmisartan (MICARDIS) 80 MG tablet Take 1 tablet by mouth daily.   No facility-administered medications prior to visit.    Review of Systems  Constitutional: Negative for appetite change, chills and fever.  Respiratory: Negative for chest tightness, shortness of breath and wheezing.   Cardiovascular: Negative for chest pain and palpitations.  Gastrointestinal: Negative for abdominal pain, nausea and vomiting.  Endocrine: Positive for polyuria.  Musculoskeletal: Positive for back pain.      Objective    BP 121/72 (BP Location: Right Arm, Patient Position: Sitting, Cuff Size: Normal)   Pulse 82   Temp 98.1 F (36.7 C) (Oral)   Resp 18   Wt 180 lb (81.6 kg)   BMI 25.83 kg/m    Physical Exam Constitutional:      General: He is not in acute distress.    Appearance: He is well-developed and well-nourished.  HENT:     Head: Normocephalic and atraumatic.     Right Ear: Hearing normal.  Left Ear: Hearing normal.     Nose: Nose normal.  Eyes:     General: Lids are normal. No scleral icterus.       Right eye: No discharge.        Left eye: No discharge.     Conjunctiva/sclera: Conjunctivae normal.  Cardiovascular:     Rate and Rhythm: Normal rate and regular rhythm.     Heart sounds: Normal heart sounds.  Pulmonary:     Effort: Pulmonary effort is normal. No respiratory distress.     Breath sounds: Normal breath sounds.  Abdominal:     General: Bowel sounds are normal.     Palpations: Abdomen is soft.  Musculoskeletal:        General: Normal range of motion.  Skin:    General: Skin is intact.     Findings: No lesion or rash.  Neurological:     Mental Status: He is alert and oriented to person, place, and time.  Psychiatric:        Mood  and Affect: Mood and affect normal.        Speech: Speech normal.        Behavior: Behavior normal.        Thought Content: Thought content normal.       Results for orders placed or performed in visit on 05/05/20  POCT Urinalysis Dipstick  Result Value Ref Range   Color, UA yellow    Clarity, UA clear    Glucose, UA Negative Negative   Bilirubin, UA small    Ketones, UA trace    Spec Grav, UA 1.015 1.010 - 1.025   Blood, UA negative    pH, UA 7.5 5.0 - 8.0   Protein, UA Negative Negative   Urobilinogen, UA 0.2 0.2 or 1.0 E.U./dL   Nitrite, UA negative    Leukocytes, UA Small (1+) (A) Negative   Appearance     Odor      Assessment & Plan     1. Acute left-sided low back pain with sciatica, sciatica laterality unspecified Left lower back and flank pain over the past 2 day without COVID symptoms. No fever or hematuria. No known injury and no rashes. Pain will radiate around to the left groin area. Urinalysis shows 15-20 WBC's per HPF. Will treat pyuria and monitor for shingles. - POCT Urinalysis Dipstick  2. Pyuria No fever, nausea or vomiting. Pyuria per microscopic exam of urine. Will get C&S and start antibiotic for the long holiday weekend. Increase fluid intake and recheck prn. - Urine Culture - sulfamethoxazole-trimethoprim (BACTRIM DS) 800-160 MG tablet; Take 1 tablet by mouth 2 (two) times daily.  Dispense: 20 tablet; Refill: 0   No follow-ups on file.      I, Delva Derden, PA-C, have reviewed all documentation for this visit. The documentation on 05/05/20 for the exam, diagnosis, procedures, and orders are all accurate and complete.    Vernie Murders, PA-C  [facility] [phone] (phone) [phone] (fax)  [place]

## 2020-05-07 LAB — URINE CULTURE

## 2020-05-10 ENCOUNTER — Telehealth: Payer: Self-pay

## 2020-05-10 NOTE — Telephone Encounter (Signed)
Copied from CRM [id number]. Topic: General - Inquiry >> May 10, 2020 10:00 AM Baldo Daub L wrote: Reason for CRM: Patient calling to get results from urine culture.  Per nurse triage they are unable to release to patient.  Patient would like a call as soon as possible since he was told results would be available for him on Monday.

## 2020-05-11 ENCOUNTER — Telehealth: Payer: Self-pay

## 2020-05-11 NOTE — Telephone Encounter (Signed)
Pt called again about the status of a call back for a covid test/ Pt stated he would like one scheduled for his wife as well and will speak with nurse about that./ Pt stated he is expecting a call back today / please advise

## 2020-05-11 NOTE — Telephone Encounter (Signed)
If patient and household is asymptomatic, and no exposures close known, would not advise covid testing. If symptoms or exposure needs to be switched to a virtual for 05/18/19 visit with Dr. Sullivan Lone and can discuss testing then or let me know if symptoms or exposure and provider will ok order for testing.

## 2020-05-11 NOTE — Telephone Encounter (Signed)
Copied from CRM [id number]. Topic: Appointment Scheduling - Scheduling Inquiry for Clinic >> May 11, 2020 12:47 PM Dalphine Handing A wrote: Patient is scheduled for an in office appt on 1/4 with pcp. Patient stated that he did not have any symptoms during screening however  he still wants to be contacted to set up a covid test prior to his appointment. Patient stated that he doesn't have any symptoms but is "scared" because of the new variant. Please advise best contact [phone]

## 2020-05-11 NOTE — Telephone Encounter (Signed)
Please advise if this is appropriate.

## 2020-05-12 NOTE — Telephone Encounter (Signed)
Patient was advised. Patient stated that he has already scheduled a test through the health department.

## 2020-05-12 NOTE — Telephone Encounter (Signed)
Patient called back this AM asking to be contacted for testing ststes that he does not have symptoms but does have a fear of possibly having Covid. Asking for a call back from someone in the office today please. Can be reached at Ph# [phone]

## 2020-05-12 NOTE — Telephone Encounter (Signed)
See result note signed off on 05-08-20.

## 2020-05-12 NOTE — Progress Notes (Signed)
MyChart Video Visit    Virtual Visit via Video Note   This visit type was conducted due to national recommendations for restrictions regarding the COVID-19 Pandemic (e.g. social distancing) in an effort to limit this patient's exposure and mitigate transmission in our community. This patient is at least at moderate risk for complications without adequate follow up. This format is felt to be most appropriate for this patient at this time. Physical exam was limited by quality of the video and audio technology used for the visit.   Patient location: Home Provider location: Office  I discussed the limitations of evaluation and management by telemedicine and the availability of in person appointments. The patient expressed understanding and agreed to proceed.  Patient: Pedro Swanson   DOB: [birth year]   69 y.o. Male  MRN: [id number] Visit Date: 05/17/2020  Today's healthcare provider: Wilhemena Durie, MD   No chief complaint on file.  Subjective    Rash This is a new problem. The current episode started in the past 7 days. The problem has been gradually improving since onset. The affected locations include the groin and left buttock. The rash is characterized by blistering. Treatments tried: acyclovir. The treatment provided moderate relief.  Patient was seen on December 23 for left lower back and abdominal pain.  On the 24th he developed a rash and since then he rates the pain as a 7-8 at over 10.  The rash is healing over.  The pain is persistent.  No systemic symptoms although he is waiting a Covid test because of a URI with mild cough and congestion.  Covid test was done independently of this practice last night.  It was done through the health department.   Patient Active Problem List   Diagnosis Date Noted  . 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 Hypercholesteremia 01/18/2015  . BP (high blood pressure)  01/18/2015  . Osteoarthrosis 01/18/2015  . 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 Compulsive tobacco user syndrome 01/18/2015  . Avitaminosis D 01/18/2015   Social History   Tobacco Use  . Smoking status: Former Smoker    Packs/day: 1.00    Years: 44.00    Pack years: 44.00    Types: Cigarettes    Quit date: 2012    Years since quitting: 10.0  . Smokeless tobacco: Never Used  Vaping Use  . Vaping Use: Never used  Substance Use Topics  . Alcohol use: Yes    Alcohol/week: 7.0 standard drinks    Types: 7 Shots of liquor per week    Comment: half drink daily  . Drug use: No   No Known Allergies    Medications: Outpatient Medications Prior to Visit  Medication Sig  . amLODipine (NORVASC) 10 MG tablet Take 1 tablet (10 mg total) by mouth daily.  Marland Kitchen ascorbic acid (VITAMIN C) 500 MG tablet Take 500 mg by mouth daily.  Marland Kitchen aspirin EC 81 MG tablet Take 81 mg by mouth. Three times weekly  . Cholecalciferol 25 MCG (1000 UT) capsule Take 1,000 Units by mouth daily.   . cyanocobalamin (,VITAMIN B-12,) 1000 MCG/ML injection Inject 1,000 mcg into the muscle every 30 (thirty) days.   Marland Kitchen doxazosin (CARDURA XL) 4 MG 24 hr tablet Take 1 tablet (4 mg total) by mouth daily with breakfast.  . rosuvastatin (CRESTOR) 20 MG tablet TAKE ONE TABLET EVERY DAY  . spironolactone (ALDACTONE) 25 MG tablet TAKE ONE TABLET BY MOUTH EVERY DAY  .  sulfamethoxazole-trimethoprim (BACTRIM DS) 800-160 MG tablet Take 1 tablet by mouth 2 (two) times daily.  . tamsulosin (FLOMAX) 0.4 MG CAPS capsule Take 1 capsule (0.4 mg total) by mouth daily.  Marland Kitchen telmisartan (MICARDIS) 80 MG tablet Take 1 tablet by mouth daily.   No facility-administered medications prior to visit.    Review of Systems  Constitutional: Negative.   Respiratory: Negative.   Cardiovascular: Negative.   Skin: Positive for rash.    Last CBC Lab Results  Component Value Date   WBC 6.4 07/15/2019   HGB 14.2 07/15/2019   HCT 42.1 07/15/2019    MCV 91 07/15/2019   MCH 30.6 07/15/2019   RDW 11.7 07/15/2019   PLT 247 07/15/2019      Objective    There were no vitals taken for this visit. BP Readings from Last 3 Encounters:  05/05/20 121/72  03/21/20 (!) 145/71  11/27/19 129/77   Wt Readings from Last 3 Encounters:  05/17/20 169 lb (76.7 kg)  05/05/20 180 lb (81.6 kg)  03/21/20 176 lb 9.6 oz (80.1 kg)      Physical Exam Vitals reviewed.  Constitutional:      General: He is not in acute distress.    Appearance: He is not ill-appearing.  Neurological:     Mental Status: He is alert.        Assessment & Plan    / 1. Post-herpetic polyneuropathy/postherpetic neuralgia We discussed treatment option and gabapentin is by far the best.  He needs to quarantine waiting for this test so we will start with 100 mg 3 times a day.  He has been advised of the side effect that frequently happens of a woozy feeling.  He will call back early next week if this is not helping the pain and we will increase the dosing.  2. Cough Covid test pending.  He and his wife are fully vaccinated.  3. Viral upper respiratory tract infection Overall he feels fairly well.  No shortness of breath.     No follow-ups on file.     I discussed the assessment and treatment plan with the patient. The patient was provided an opportunity to ask questions and all were answered. The patient agreed with the plan and demonstrated an understanding of the instructions.   The patient was advised to call back or seek an in-person evaluation if the symptoms worsen or if the condition fails to improve as anticipated.  I provided 14 minutes of non-face-to-face time during this encounter.  I, Megan Mans, MD, have reviewed all documentation for this visit. The documentation on 05/17/20 for the exam, diagnosis, procedures, and orders are all accurate and complete.   Richard Wendelyn Breslow, MD [facility] [phone] (phone) [phone]  (fax)  [facility]

## 2020-05-17 ENCOUNTER — Telehealth: Payer: Self-pay

## 2020-05-17 ENCOUNTER — Telehealth (INDEPENDENT_AMBULATORY_CARE_PROVIDER_SITE_OTHER): Payer: Medicare HMO | Admitting: Family Medicine

## 2020-05-17 ENCOUNTER — Encounter: Payer: Self-pay | Admitting: Family Medicine

## 2020-05-17 VITALS — Wt 169.0 lb

## 2020-05-17 DIAGNOSIS — R059 Cough, unspecified: Secondary | ICD-10-CM

## 2020-05-17 DIAGNOSIS — J069 Acute upper respiratory infection, unspecified: Secondary | ICD-10-CM | POA: Diagnosis not present

## 2020-05-17 DIAGNOSIS — B0223 Postherpetic polyneuropathy: Secondary | ICD-10-CM

## 2020-05-17 MED ORDER — GABAPENTIN 100 MG PO CAPS: 100.0000 mg | ORAL_CAPSULE | Freq: Three times a day (TID) | ORAL | 3 refills | Status: DC

## 2020-05-17 NOTE — Telephone Encounter (Signed)
Copied from CRM [id number]. Topic: Clinical - COVID Pre-Screen >> May 16, 2020  2:54 PM Cuthrell, Pearlean Brownie wrote: Patient has an appt Jan 4th 2022 11:40am in office, patient stated is waiting COVID test results and has a lost of taste and smell, unable to change appt to My Chart video/ patient is aware that this will be My Chart video, but I am unable to change it in Epic     1. To the best of your knowledge, have you been in close contact with anyone with a confirmed diagnosis of COVID 19?  no  If no - Proceed to next question; If yes - Schedule patient for a virtual visit  2. Have you had any one or more of the following: fever, chills, cough, shortness of breath or any flu-like symptoms?  no  If no - Proceed to next question; If yes - Schedule patient for a virtual visit  3. Have you been diagnosed with or have a previous diagnosis of COVID 19?  no  If no - Proceed to next question; If yes - Schedule patient for a virtual visit  4. I am going to go over a few other symptoms with you. Please let me know if you are experiencing any of the following: yes  Ear, nose or throat discomfort  A sore throat  Headache  Muscle pain  Diarrhea  Loss of taste or smell  If no - Continue with scheduling process; If yes - Document in scheduling notes   Thank you for answering these questions. Please know we will ask you these questions or similar questions when you arrive for your appointment and again it's how we are keeping everyone safe. Also, to keep you safe, please use the provided hand sanitizer when you enter the building. [facility], we are asking everyone in the building to wear a mask because they help [place] prevent the spread of germs.   Do you have a mask of your own, if not, we are happy to provide one for you. The last thing I want to go over with you is the no visitor guidelines. This means no one can attend the appointment with you unless you need physical assistance. I  understand this may be different from your past appointments and I know this may be difficult but please know if someone is driving you we are happy to call them for you once your appointment is over.

## 2020-05-23 ENCOUNTER — Telehealth: Payer: Self-pay | Admitting: Family Medicine

## 2020-05-23 NOTE — Telephone Encounter (Signed)
Copied from [place] [id number]. Topic: General - Other >> May 23, 2020  9:35 AM Tessa Lerner A wrote: Reason for CRM: Patient is calling to follow up with Dr. Alben Spittle request for an update from appointment on 05/17/20. Patient would call back from Dr. Rosanna Randy to let him know that the medication is having little to no effect on shingles and discuss an increase in medication

## 2020-05-24 NOTE — Telephone Encounter (Signed)
Patient has appt scheduled 8:20 05/25/20. KW

## 2020-05-25 ENCOUNTER — Ambulatory Visit (INDEPENDENT_AMBULATORY_CARE_PROVIDER_SITE_OTHER): Payer: Medicare HMO | Admitting: Family Medicine

## 2020-05-25 ENCOUNTER — Encounter: Payer: Self-pay | Admitting: Family Medicine

## 2020-05-25 VITALS — Wt 170.0 lb

## 2020-05-25 DIAGNOSIS — J069 Acute upper respiratory infection, unspecified: Secondary | ICD-10-CM

## 2020-05-25 DIAGNOSIS — B0223 Postherpetic polyneuropathy: Secondary | ICD-10-CM | POA: Diagnosis not present

## 2020-05-25 MED ORDER — GABAPENTIN 300 MG PO CAPS: 300.0000 mg | ORAL_CAPSULE | Freq: Three times a day (TID) | ORAL | 11 refills | Status: DC

## 2020-05-25 NOTE — Progress Notes (Signed)
MyChart Video Visit    Virtual Visit via Video Note   This visit type was conducted due to national recommendations for restrictions regarding the COVID-19 Pandemic (e.g. social distancing) in an effort to limit this patient's exposure and mitigate transmission in our community. This patient is at least at moderate risk for complications without adequate follow up. This format is felt to be most appropriate for this patient at this time. Physical exam was limited by quality of the video and audio technology used for the visit.   Patient location: Home Provider location: Office  I discussed the limitations of evaluation and management by telemedicine and the availability of in person appointments. The patient expressed understanding and agreed to proceed.  Patient: Pedro Swanson   DOB: July 15, 1950   70 y.o. Male  MRN: [id number] Visit Date: 05/25/2020  Today's healthcare provider: Wilhemena Durie, MD   Chief Complaint  Patient presents with  . Rash   Subjective    HPI  Patient reports he is still having pain due to shingles. Patient reports he has been taking gabapentin 100mg  TID and reports mild pain control. Patient is hoping to have medication increased today.  Patient is still having significant postherpetic pain.  He is just looking for guidance on what to do with his pain.  The lesions have almost completely healed.  He is having some pain in the left lower chest wall above where the shingles was but it is very consistent and is not exertional he has no associated shortness of breath or anything like that.  He and his wife were both diagnosed with COVID January 3 but they are essentially over all other symptoms except for fatigue.  Patient Active Problem List   Diagnosis Date Noted  . 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 Hypercholesteremia 01/18/2015  . BP (high blood pressure) 01/18/2015  .  Osteoarthrosis 01/18/2015  . 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 Compulsive tobacco user syndrome 01/18/2015  . Avitaminosis D 01/18/2015   Social History   Tobacco Use  . Smoking status: Former Smoker    Packs/day: 1.00    Years: 44.00    Pack years: 44.00    Types: Cigarettes    Quit date: 2012    Years since quitting: 10.0  . Smokeless tobacco: Never Used  Vaping Use  . Vaping Use: Never used  Substance Use Topics  . Alcohol use: Yes    Alcohol/week: 7.0 standard drinks    Types: 7 Shots of liquor per week    Comment: half drink daily  . Drug use: No   No Known Allergies    Medications: Outpatient Medications Prior to Visit  Medication Sig  . ascorbic acid (VITAMIN C) 500 MG tablet Take 500 mg by mouth daily.  Marland Kitchen aspirin EC 81 MG tablet Take 81 mg by mouth. Three times weekly  . Cholecalciferol 25 MCG (1000 UT) capsule Take 1,000 Units by mouth daily.   Marland Kitchen gabapentin (NEURONTIN) 100 MG capsule Take 1 capsule (100 mg total) by mouth 3 (three) times daily.  . rosuvastatin (CRESTOR) 20 MG tablet TAKE ONE TABLET EVERY DAY  . spironolactone (ALDACTONE) 25 MG tablet TAKE ONE TABLET BY MOUTH EVERY DAY  . telmisartan (MICARDIS) 80 MG tablet Take 1 tablet by mouth daily.  Marland Kitchen amLODipine (NORVASC) 10 MG tablet Take 1 tablet (10 mg total) by mouth daily. (Patient not taking: No sig reported)  . doxazosin (CARDURA XL) 4  MG 24 hr tablet Take 1 tablet (4 mg total) by mouth daily with breakfast. (Patient not taking: No sig reported)  . [DISCONTINUED] cyanocobalamin (,VITAMIN B-12,) 1000 MCG/ML injection Inject 1,000 mcg into the muscle every 30 (thirty) days.  (Patient not taking: Reported on 05/25/2020)  . [DISCONTINUED] sulfamethoxazole-trimethoprim (BACTRIM DS) 800-160 MG tablet Take 1 tablet by mouth 2 (two) times daily. (Patient not taking: Reported on 05/17/2020)  . [DISCONTINUED] tamsulosin (FLOMAX) 0.4 MG CAPS capsule Take 1 capsule (0.4 mg total) by mouth daily. (Patient not  taking: Reported on 05/17/2020)   No facility-administered medications prior to visit.    Review of Systems  Constitutional: Negative.   Respiratory: Negative.   Cardiovascular: Negative.   Skin: Positive for rash.    Last CBC Lab Results  Component Value Date   WBC 6.4 07/15/2019   HGB 14.2 07/15/2019   HCT 42.1 07/15/2019   MCV 91 07/15/2019   MCH 30.6 07/15/2019   RDW 11.7 07/15/2019   PLT 247 07/15/2019      Objective    Wt 170 lb (77.1 kg)   BMI 24.39 kg/m  BP Readings from Last 3 Encounters:  05/05/20 121/72  03/21/20 (!) 145/71  11/27/19 129/77   Wt Readings from Last 3 Encounters:  05/25/20 170 lb (77.1 kg)  05/17/20 169 lb (76.7 kg)  05/05/20 180 lb (81.6 kg)      Physical Exam  He is comfortable on the visit and alert and oriented.  He shows me the rash and it is almost completely resolved on his trunk.  He is breathing without any difficulty he has no obvious wheezing.  He is able to speak in complete sentences.   Assessment & Plan     1. Post-herpetic polyneuropathy Increase gabapentin to 200 mg three times a day for about 4 days and then to 300 mg three times a day.  I will plan on seeing her back in about 2 weeks.  He does not need to come in if his pain is resolved.  2. Viral upper respiratory tract infection Recent COVID infection has resolved.   No follow-ups on file.     I discussed the assessment and treatment plan with the patient. The patient was provided an opportunity to ask questions and all were answered. The patient agreed with the plan and demonstrated an understanding of the instructions.   The patient was advised to call back or seek an in-person evaluation if the symptoms worsen or if the condition fails to improve as anticipated.  I provided 14 minutes of non-face-to-face time during this encounter.  I, Wilhemena Durie, MD, have reviewed all documentation for this visit. The documentation on 05/25/20 for the exam,  diagnosis, procedures, and orders are all accurate and complete.   Lanessa Shill Cranford Mon, MD [facility] [phone] (phone) [phone] (fax)  [place]

## 2020-06-01 ENCOUNTER — Ambulatory Visit: Payer: Self-pay | Admitting: *Deleted

## 2020-06-01 NOTE — Telephone Encounter (Signed)
Returned call to patient and advised below. Patient requested to be seen in office in the morning. Scheduled appointment.

## 2020-06-01 NOTE — Telephone Encounter (Signed)
Agree we going back to lower dose of gabapentin.  May need to be seen in the office for the heart rate or for the shingles pain in the next couple of weeks.

## 2020-06-01 NOTE — Progress Notes (Addendum)
Established patient visit   Patient: Pedro Swanson   DOB: 05-Mar-1951   70 y.o. Male  MRN: [id number] Visit Date: 06/02/2020  Today's healthcare provider: Wilhemena Durie, MD   No chief complaint on file.  Subjective    HPI  Patient is here concerning elevated heart rate. Pt's highest heart rate was 130 on 05/31/20 and says that this may be due to a change in gabapentin medication dosage.   Patient Active Problem List   Diagnosis Date Noted  . 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 Hypercholesteremia 01/18/2015  . BP (high blood pressure) 01/18/2015  . Osteoarthrosis 01/18/2015  . 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 Compulsive tobacco user syndrome 01/18/2015  . Avitaminosis D 01/18/2015   Social History   Tobacco Use  . Smoking status: Former Smoker    Packs/day: 1.00    Years: 44.00    Pack years: 44.00    Types: Cigarettes    Quit date: 2012    Years since quitting: 10.0  . Smokeless tobacco: Never Used  Vaping Use  . Vaping Use: Never used  Substance Use Topics  . Alcohol use: Yes    Alcohol/week: 7.0 standard drinks    Types: 7 Shots of liquor per week    Comment: half drink daily  . Drug use: No   No Known Allergies     Medications: Outpatient Medications Prior to Visit  Medication Sig  . amLODipine (NORVASC) 10 MG tablet Take 1 tablet (10 mg total) by mouth daily. (Patient not taking: No sig reported)  . ascorbic acid (VITAMIN C) 500 MG tablet Take 500 mg by mouth daily.  Marland Kitchen aspirin EC 81 MG tablet Take 81 mg by mouth. Three times weekly  . Cholecalciferol 25 MCG (1000 UT) capsule Take 1,000 Units by mouth daily.   Marland Kitchen doxazosin (CARDURA XL) 4 MG 24 hr tablet Take 1 tablet (4 mg total) by mouth daily with breakfast. (Patient not taking: No sig reported)  . gabapentin (NEURONTIN) 300 MG capsule Take 1 capsule (300 mg total) by mouth 3 (three) times daily.  . rosuvastatin  (CRESTOR) 20 MG tablet TAKE ONE TABLET EVERY DAY  . spironolactone (ALDACTONE) 25 MG tablet TAKE ONE TABLET BY MOUTH EVERY DAY  . telmisartan (MICARDIS) 80 MG tablet Take 1 tablet by mouth daily.   No facility-administered medications prior to visit.    Review of Systems  Constitutional: Negative for appetite change, chills and fever.  Respiratory: Negative for chest tightness, shortness of breath and wheezing.   Cardiovascular: Negative for chest pain and palpitations.  Gastrointestinal: Negative for abdominal pain, nausea and vomiting.    Last CBC Lab Results  Component Value Date   WBC 6.4 07/15/2019   HGB 14.2 07/15/2019   HCT 42.1 07/15/2019   MCV 91 07/15/2019   MCH 30.6 07/15/2019   RDW 11.7 07/15/2019   PLT 247 74/25/9563   Last metabolic panel Lab Results  Component Value Date   GLUCOSE 103 (H) 07/15/2019   NA 137 07/15/2019   K 5.1 07/15/2019   CL 101 07/15/2019   CO2 21 07/15/2019   BUN 19 07/15/2019   CREATININE 1.11 07/15/2019   GFRNONAA 67 07/15/2019   GFRAA 77 07/15/2019   CALCIUM 9.8 07/15/2019   PROT 7.2 07/15/2019   ALBUMIN 4.9 (H) 07/15/2019   LABGLOB 2.3 07/15/2019   AGRATIO 2.1 07/15/2019   BILITOT 0.4 07/15/2019   ALKPHOS 57 07/15/2019  AST 16 07/15/2019   ALT 13 07/15/2019   ANIONGAP 8 05/28/2014   Last lipids Lab Results  Component Value Date   CHOL 125 07/15/2019   HDL 48 07/15/2019   LDLCALC 59 07/15/2019   TRIG 94 07/15/2019   CHOLHDL 2.6 07/15/2019   Last thyroid functions Lab Results  Component Value Date   TSH 1.900 07/15/2019       Objective    There were no vitals taken for this visit. BP Readings from Last 3 Encounters:  05/05/20 121/72  03/21/20 (!) 145/71  11/27/19 129/77   Wt Readings from Last 3 Encounters:  05/25/20 170 lb (77.1 kg)  05/17/20 169 lb (76.7 kg)  05/05/20 180 lb (81.6 kg)  ECG reveals normal sinus rhythm without ischemic changes.     Physical Exam Vitals reviewed.  Constitutional:       Appearance: He is well-developed and normal weight.  HENT:     Head: Normocephalic and atraumatic.     Right Ear: Tympanic membrane and external ear normal.     Left Ear: Tympanic membrane and external ear normal.     Nose: Nose normal.     Mouth/Throat:     Pharynx: Oropharynx is clear.  Eyes:     General: No scleral icterus.    Conjunctiva/sclera: Conjunctivae normal.  Neck:     Thyroid: No thyromegaly.  Cardiovascular:     Rate and Rhythm: Normal rate and regular rhythm.     Heart sounds: Normal heart sounds.  Pulmonary:     Effort: Pulmonary effort is normal.     Breath sounds: Normal breath sounds.  Abdominal:     Palpations: Abdomen is soft.  Musculoskeletal:     Comments: Left clavicle obviously has had had AC severe sprain in past.  Skin:    General: Skin is warm and dry.  Neurological:     Mental Status: He is alert and oriented to person, place, and time. Mental status is at baseline.  Psychiatric:        Mood and Affect: Mood normal.        Behavior: Behavior normal.        Thought Content: Thought content normal.        Judgment: Judgment normal.   ECG reveals normal sinus rhythm with on specific ST-T wave changes.   No results found for any visits on 06/02/20.  Assessment & Plan     1. Elevated heart rate with elevated blood pressure and diagnosis of hypertension For peace of mind for patient will refer to cardiology. - EKG 12-Lead - Ambulatory referral to Cardiology - aspirin chewable tablet 81 mg  2. Malignant neoplasm of colon, unspecified part of colon ([place])  - Cologuard  3. Colon cancer screening   4. Herpes zoster with complication Try Zostrix cream.  May need Lidoderm patch.   No follow-ups on file.      I, Wilhemena Durie, MD, have reviewed all documentation for this visit. The documentation on 06/10/20 for the exam, diagnosis, procedures, and orders are all accurate and complete.    Amaury Kuzel Cranford Mon, MD  [facility] [phone] (phone) [phone] (fax)  [place]

## 2020-06-01 NOTE — Telephone Encounter (Signed)
Patient is calling to report:  Vitals last night 9:35pm BP 165/82 P 127 10:20pm 136/88 P 130 Today : 132/82 P 75 Patient is concerned that his pulse went up so high last night- questions if the increase in gabapentin dosing may have caused increased heart rate. Patient is going to back down to the 200 mg this morning until he hears from office.  Reason for Disposition . Age > 60 years (Exception: brief heartbeat symptoms that went away and now feels well)  Answer Assessment - Initial Assessment Questions 1. DESCRIPTION: "Please describe your heart rate or heartbeat that you are having" (e.g., fast/slow, regular/irregular, skipped or extra beats, "palpitations")     132/82 P 75 this morning- last night felt erratic- 130- patient felt flushed, normal temp, slightly lightheaded, slight pain in eyes- but really didn't feel heart rate racing, no chest pain  2. ONSET: "When did it start?" (Minutes, hours or days)      Last night, yesterday am- it was higher than usual 3. DURATION: "How long does it last" (e.g., seconds, minutes, hours)     Progressed during the day- came down 10:20- 136/88 P 130 4. PATTERN "Does it come and go, or has it been constant since it started?"  "Does it get worse with exertion?"   "Are you feeling it now?"    Comes and goes, not feeling 5. TAP: "Using your hand, can you tap out what you are feeling on a chair or table in front of you, so that I can hear?" (Note: not all patients can do this)       Much steadier this morning 6. HEART RATE: "Can you tell me your heart rate?" "How many beats in 15 seconds?"  (Note: not all patients can do this)       BP- 143/91 P 83 7. RECURRENT SYMPTOM: "Have you ever had this before?" If Yes, ask: "When was the last time?" and "What happened that time?"      no 8. CAUSE: "What do you think is causing the palpitations?"     Recent COVID, shingles 12/24- pain with shingles 9. CARDIAC HISTORY: "Do you have any history of heart disease?"  (e.g., heart attack, angina, bypass surgery, angioplasty, arrhythmia)      Heart disease 10. OTHER SYMPTOMS: "Do you have any other symptoms?" (e.g., dizziness, chest pain, sweating, difficulty breathing)       Slight dizziness 11. PREGNANCY: "Is there any chance you are pregnant?" "When was your last menstrual period?"       n/a  Protocols used: HEART RATE AND HEARTBEAT QUESTIONS-A-AH

## 2020-06-02 ENCOUNTER — Other Ambulatory Visit: Payer: Self-pay

## 2020-06-02 ENCOUNTER — Telehealth: Payer: Self-pay | Admitting: Family Medicine

## 2020-06-02 ENCOUNTER — Ambulatory Visit (INDEPENDENT_AMBULATORY_CARE_PROVIDER_SITE_OTHER): Payer: Medicare HMO | Admitting: Family Medicine

## 2020-06-02 VITALS — BP 123/69 | HR 90 | Temp 98.7°F | Ht 70.0 in | Wt 177.2 lb

## 2020-06-02 DIAGNOSIS — C189 Malignant neoplasm of colon, unspecified: Secondary | ICD-10-CM

## 2020-06-02 DIAGNOSIS — R009 Unspecified abnormalities of heart beat: Secondary | ICD-10-CM

## 2020-06-02 DIAGNOSIS — Z1211 Encounter for screening for malignant neoplasm of colon: Secondary | ICD-10-CM

## 2020-06-02 DIAGNOSIS — I10 Essential (primary) hypertension: Secondary | ICD-10-CM

## 2020-06-02 DIAGNOSIS — B028 Zoster with other complications: Secondary | ICD-10-CM

## 2020-06-02 MED ORDER — ASPIRIN 81 MG PO CHEW: 81.0000 mg | CHEWABLE_TABLET | Freq: Once | ORAL | Status: AC

## 2020-06-02 MED ORDER — CAPSAICIN 0.075 % EX CREA: 1.0000 "application " | TOPICAL_CREAM | Freq: Two times a day (BID) | CUTANEOUS | 0 refills | Status: DC

## 2020-06-02 NOTE — Telephone Encounter (Signed)
Pharmacy calling and is requesting to know if it is okay to have pt take the 1% Zostrix cream due to them not having the 0.075% in stock. Please advise.      [facility] - [place], [place] - [address]  [place] [address]  Phone: [phone] Fax: [phone]  Hours: Not open 24 hours

## 2020-06-03 NOTE — Telephone Encounter (Signed)
They can try the Lidoderm patches I think they already have some at home.

## 2020-06-03 NOTE — Telephone Encounter (Signed)
Copied from [place] [id number]. Topic: General - Inquiry >> Jun 03, 2020 10:00 AM Pedro Swanson D wrote: Reason for CRM: Pt's wife called saying they could not use the cream that was prescribed.  She said it burned his skin and they had to wash it off.  She wants to know if there is something else he can used.  She also said his blood pressure was elevated.  CB#  [phone] .

## 2020-06-06 NOTE — Telephone Encounter (Signed)
LMOVM for [name] to return call.  

## 2020-06-10 NOTE — Telephone Encounter (Signed)
LMOVM for pt to return call 

## 2020-06-17 ENCOUNTER — Telehealth: Payer: Self-pay

## 2020-06-17 NOTE — Telephone Encounter (Signed)
Have patient's home on 3 different occasions with no reply.

## 2020-06-17 NOTE — Telephone Encounter (Signed)
Copied from [place] [id number]. Topic: General - Other >> Jun 17, 2020  2:16 PM Tessa Lerner A wrote: [employer] made contact to notify PCP that ICD10 code confirmation is needed before the Cologuard order can ship Representative is requesting to speak with a member of clinical staff when available Ref # [id number]

## 2020-06-20 ENCOUNTER — Other Ambulatory Visit: Payer: Self-pay | Admitting: Family Medicine

## 2020-06-20 DIAGNOSIS — E7800 Pure hypercholesterolemia, unspecified: Secondary | ICD-10-CM

## 2020-06-20 NOTE — Telephone Encounter (Signed)
Updated code for exact sciences.

## 2020-06-20 NOTE — Telephone Encounter (Signed)
Requested Prescriptions  Pending Prescriptions Disp Refills  . rosuvastatin (CRESTOR) 20 MG tablet [Pharmacy Med Name: ROSUVASTATIN CALCIUM 20 MG TAB] 90 tablet 3    Sig: TAKE ONE TABLET EVERY DAY     Cardiovascular:  Antilipid - Statins Failed - 06/20/2020  9:13 AM      Failed - LDL in normal range and within 360 days    LDL Chol Calc (NIH)  Date Value Ref Range Status  07/15/2019 59 0 - 99 mg/dL Final         Passed - Total Cholesterol in normal range and within 360 days    Cholesterol, Total  Date Value Ref Range Status  07/15/2019 125 100 - 199 mg/dL Final         Passed - HDL in normal range and within 360 days    HDL  Date Value Ref Range Status  07/15/2019 48 >39 mg/dL Final         Passed - Triglycerides in normal range and within 360 days    Triglycerides  Date Value Ref Range Status  07/15/2019 94 0 - 149 mg/dL Final         Passed - Patient is not pregnant      Passed - Valid encounter within last 12 months    Recent Outpatient Visits          2 weeks ago Elevated heart rate with elevated blood pressure and diagnosis of hypertension   [facility] Jerrol Banana., MD   3 weeks ago Post-herpetic polyneuropathy   [facility] Jerrol Banana., MD   1 month ago Post-herpetic polyneuropathy   [facility] Jerrol Banana., MD   1 month ago Acute left-sided low back pain with sciatica, sciatica laterality unspecified   [place], Vickki Muff, PA-C   3 months ago Essential hypertension   [facility] Jerrol Banana., MD      Future Appointments            In 3 weeks Jerrol Banana., MD [facility] [place]

## 2020-06-28 LAB — COLOGUARD: Cologuard: NEGATIVE

## 2020-07-01 LAB — EXTERNAL GENERIC LAB PROCEDURE: COLOGUARD: NEGATIVE

## 2020-07-01 LAB — COLOGUARD: COLOGUARD: NEGATIVE

## 2020-07-11 DIAGNOSIS — I480 Paroxysmal atrial fibrillation: Secondary | ICD-10-CM | POA: Insufficient documentation

## 2020-07-13 NOTE — Progress Notes (Signed)
Subjective:   Pedro Swanson is a 70 y.o. male who presents for Medicare Annual/Subsequent preventive examination.  Review of Systems    N/A  Cardiac Risk Factors include: advanced age (>54men, >60 women);dyslipidemia;male gender;hypertension;sedentary lifestyle     Objective:    Today's Vitals   07/14/20 0902  BP: 130/62  Pulse: 68  Temp: 98.2 F (36.8 C)  TempSrc: Oral  SpO2: 95%  Weight: 177 lb 6.4 oz (80.5 kg)  Height: 5\' 10"  (1.778 m)  PainSc: 0-No pain   Body mass index is 25.45 kg/m.  Advanced Directives 07/14/2020 07/14/2019 07/07/2018 07/30/2016  Does Patient Have a Medical Advance Directive? No No No No  Would patient like information on creating a medical advance directive? No - Patient declined No - Patient declined No - Patient declined -    Current Medications (verified) Outpatient Encounter Medications as of 07/14/2020  Medication Sig  . ascorbic acid (VITAMIN C) 500 MG tablet Take 500 mg by mouth daily.  . Cholecalciferol 25 MCG (1000 UT) capsule Take 1,000 Units by mouth daily.   . cyanocobalamin 1000 MCG tablet Take 1,000 mcg by mouth daily.  Marland Kitchen ELIQUIS 5 MG TABS tablet Take 5 mg by mouth 2 (two) times daily.  . metoprolol succinate (TOPROL-XL) 25 MG 24 hr tablet Take 0.5 mg by mouth daily at 6 (six) AM.  . rosuvastatin (CRESTOR) 20 MG tablet TAKE ONE TABLET EVERY DAY  . spironolactone (ALDACTONE) 25 MG tablet Take 25 mg by mouth daily.  Marland Kitchen telmisartan (MICARDIS) 80 MG tablet Take 1 tablet by mouth daily.  Marland Kitchen amLODipine (NORVASC) 10 MG tablet Take 1 tablet (10 mg total) by mouth daily. (Patient not taking: No sig reported)  . aspirin EC 81 MG tablet Take 81 mg by mouth. Three times weekly (Patient not taking: Reported on 07/14/2020)  . capsicum (ZOSTRIX) 0.075 % topical cream Apply 1 application topically 2 (two) times daily. (Patient not taking: Reported on 07/14/2020)  . doxazosin (CARDURA XL) 4 MG 24 hr tablet Take 1 tablet (4 mg total) by mouth daily with  breakfast. (Patient not taking: Reported on 07/14/2020)  . gabapentin (NEURONTIN) 300 MG capsule Take 1 capsule (300 mg total) by mouth 3 (three) times daily. (Patient not taking: No sig reported)   Facility-Administered Encounter Medications as of 07/14/2020  Medication  . aspirin chewable tablet 81 mg    Allergies (verified) Patient has no known allergies.   History: Past Medical History:  Diagnosis Date  . Cataract   . COVID   . Hyperlipidemia   . Hypertension   . Palpitations   . Shingles    Past Surgical History:  Procedure Laterality Date  . CATARACT EXTRACTION     right eye   Family History  Problem Relation Age of Onset  . CAD Father   . Hypertension Father    Social History   Socioeconomic History  . Marital status: Married    Spouse name: Not on file  . Number of children: 2  . Years of education: Not on file  . Highest education level: Bachelor's degree (e.g., BA, AB, BS)  Occupational History  . Occupation: retired  Tobacco Use  . Smoking status: Former Smoker    Packs/day: 1.00    Years: 44.00    Pack years: 44.00    Types: Cigarettes    Quit date: 2012    Years since quitting: 10.1  . Smokeless tobacco: Never Used  Vaping Use  . Vaping Use: Never used  Substance and Sexual Activity  . Alcohol use: Yes    Alcohol/week: 7.0 standard drinks    Types: 7 Shots of liquor per week    Comment: half drink daily  . Drug use: No  . Sexual activity: Not on file  Other Topics Concern  . Not on file  Social History Narrative  . Not on file   Social Determinants of Health   Financial Resource Strain: Low Risk   . Difficulty of Paying Living Expenses: Not hard at all  Food Insecurity: No Food Insecurity  . Worried About Charity fundraiser in the Last Year: Never true  . Ran Out of Food in the Last Year: Never true  Transportation Needs: No Transportation Needs  . Lack of Transportation (Medical): No  . Lack of Transportation (Non-Medical): No   Physical Activity: Inactive  . Days of Exercise per Week: 0 days  . Minutes of Exercise per Session: 0 min  Stress: No Stress Concern Present  . Feeling of Stress : Not at all  Social Connections: Moderately Isolated  . Frequency of Communication with Friends and Family: Three times a week  . Frequency of Social Gatherings with Friends and Family: More than three times a week  . Attends Religious Services: Never  . Active Member of Clubs or Organizations: No  . Attends Archivist Meetings: Never  . Marital Status: Married    Tobacco Counseling Counseling given: Not Answered   Clinical Intake:  Pre-visit preparation completed: Yes  Pain : No/denies pain Pain Score: 0-No pain     Nutritional Status: BMI 25 -29 Overweight Nutritional Risks: None Diabetes: No  How often do you need to have someone help you when you read instructions, pamphlets, or other written materials from your doctor or pharmacy?: 1 - Never  Diabetic? No  Interpreter Needed?: No  Information entered by :: [facility], LPN   Activities of Daily Living In your present state of health, do you have any difficulty performing the following activities: 07/14/2020 03/21/2020  Hearing? N N  Vision? N N  Difficulty concentrating or making decisions? N N  Walking or climbing stairs? N N  Dressing or bathing? N N  Doing errands, shopping? N N  Preparing Food and eating ? N -  Using the Toilet? N -  In the past six months, have you accidently leaked urine? N -  Do you have problems with loss of bowel control? N -  Managing your Medications? N -  Managing your Finances? N -  Housekeeping or managing your Housekeeping? N -  Some recent data might be hidden    Patient Care Team: Jerrol Banana., MD as PCP - General (Family Medicine) Corey Skains, MD as Consulting Physician (Cardiology) Vladimir Crofts, MD as Consulting Physician (Neurology)  Indicate any recent Medical Services you may  have received from other than [facility] providers in the past year (date may be approximate).     Assessment:   This is a routine wellness examination for Pedro Swanson.  Hearing/Vision screen No exam data present  Dietary issues and exercise activities discussed: Current Exercise Habits: Home exercise routine, Type of exercise: walking, Time (Minutes): 20, Frequency (Times/Week): 2 (to 3 days), Weekly Exercise (Minutes/Week): 40, Intensity: Mild, Exercise limited by: None identified  Goals    . DIET - INCREASE WATER INTAKE     Recommend to drink at least 6-8 8oz glasses of water per day.      Depression Screen [facility] 2/9  Scores 07/14/2020 03/21/2020 07/14/2019 07/07/2018 07/07/2018 06/12/2017 06/12/2016  PHQ - 2 Score 0 0 0 0 0 0 0  PHQ- 9 Score - 0 - 2 - 3 -    Fall Risk Fall Risk  07/14/2020 03/21/2020 07/14/2019 07/07/2018 07/07/2018  Falls in the past year? 0 0 0 0 0  Number falls in past yr: 0 0 0 - -  Injury with Fall? 0 0 0 - -  Follow up - Falls evaluation completed - - -    FALL RISK PREVENTION PERTAINING TO THE HOME:  Any stairs in or around the home? No  If so, are there any without handrails? No  Home free of loose throw rugs in walkways, pet beds, electrical cords, etc? Yes  Adequate lighting in your home to reduce risk of falls? Yes   ASSISTIVE DEVICES UTILIZED TO PREVENT FALLS:  Life alert? No  Use of a cane, walker or w/c? No  Grab bars in the bathroom? No  Shower chair or bench in shower? Yes  Elevated toilet seat or a handicapped toilet? Yes   TIMED UP AND GO:  Was the test performed? Yes .  Length of time to ambulate 10 feet: 7 sec.   Gait slow and steady without use of assistive device  Cognitive Function: Normal cognitive status assessed by direct observation by this Nurse Health Advisor. No abnormalities found.       6CIT Screen 06/12/2017  What Year? 0 points  What month? 0 points  What time? 0 points  Count back from 20 0 points  Months in reverse 0 points   Repeat phrase 6 points  Total Score 6    Immunizations Immunization History  Administered Date(s) Administered  . Fluad Quad(high Dose 65+) 02/16/2019, 03/21/2020  . Influenza, High Dose Seasonal PF 06/12/2017, 05/06/2018  . PFIZER(Purple Top)SARS-COV-2 Vaccination 06/11/2019, 07/09/2019  . Pneumococcal Polysaccharide-23 02/16/2019  . Tdap 03/31/2008  . Zoster 11/04/2012    TDAP status: Due, Education has been provided regarding the importance of this vaccine. Advised may receive this vaccine at local pharmacy or Health Dept. Aware to provide a copy of the vaccination record if obtained from local pharmacy or Health Dept. Verbalized acceptance and understanding.  Flu Vaccine status: Up to date  Pneumococcal vaccine status: Declined,  Education has been provided regarding the importance of this vaccine but patient still declined. Advised may receive this vaccine at local pharmacy or Health Dept. Aware to provide a copy of the vaccination record if obtained from local pharmacy or Health Dept. Verbalized acceptance and understanding.   Covid-19 vaccine status: Completed vaccines  Qualifies for Shingles Vaccine? Yes   Zostavax completed Yes   Shingrix Completed?: No.    Education has been provided regarding the importance of this vaccine. Patient has been advised to call insurance company to determine out of pocket expense if they have not yet received this vaccine. Advised may also receive vaccine at local pharmacy or Health Dept. Verbalized acceptance and understanding.  Screening Tests Health Maintenance  Topic Date Due  . COVID-19 Vaccine (3 - Pfizer risk 4-dose series) 08/06/2019  . TETANUS/TDAP  07/14/2021 (Originally 03/31/2018)  . PNA vac Low Risk Adult (2 of 2 - PCV13) 07/14/2021 (Originally 02/16/2020)  . Fecal DNA (Cologuard)  06/28/2023  . INFLUENZA VACCINE  Completed  . Hepatitis C Screening  Completed  . HPV VACCINES  Aged Out    Health Maintenance  Health  Maintenance Due  Topic Date Due  . COVID-19 Vaccine (3 -  Pfizer risk 4-dose series) 08/06/2019    Colorectal cancer screening: Type of screening: Cologuard. Completed 06/27/20. Repeat every 3 years  Lung Cancer Screening: (Low Dose CT Chest recommended if Age 29-80 years, 30 pack-year currently smoking OR have quit w/in 15years.) does qualify, however had this completed 08/11/19. Repeat yearly.   Additional Screening:  Hepatitis C Screening: Up to date  Vision Screening: Recommended annual ophthalmology exams for early detection of glaucoma and other disorders of the eye. Is the patient up to date with their annual eye exam?  Yes  Who is the provider or what is the name of the office in which the patient attends annual eye exams? [facility] If pt is not established with a provider, would they like to be referred to a provider to establish care? No .   Dental Screening: Recommended annual dental exams for proper oral hygiene  Community Resource Referral / Chronic Care Management: CRR required this visit?  No   CCM required this visit?  No      Plan:     I have personally reviewed and noted the following in the patient's chart:   . Medical and social history . Use of alcohol, tobacco or illicit drugs  . Current medications and supplements . Functional ability and status . Nutritional status . Physical activity . Advanced directives . List of other physicians . Hospitalizations, surgeries, and ER visits in previous 12 months . Vitals . Screenings to include cognitive, depression, and falls . Referrals and appointments  In addition, I have reviewed and discussed with patient certain preventive protocols, quality metrics, and best practice recommendations. A written personalized care plan for preventive services as well as general preventive health recommendations were provided to patient.     Katena Petitjean [place], [place]   09/18/3218   Nurse Notes: Pt declined receiving a  Prevnar 13 vaccine at this time. Covid booster due 08/2020.

## 2020-07-13 NOTE — Progress Notes (Signed)
Annual Wellness Visit     Patient: Pedro Swanson, Male    DOB: 11/14/1950, 70 y.o.   MRN: [id number] Visit Date: 07/14/2020  Today's Provider: Wilhemena Durie, MD   Chief Complaint  Patient presents with  . Annual Exam   Patient has AWE at [facility] today.   [name] is a 70 y.o. male who presents today for his Annual Wellness Visit. He reports consuming a general diet. The patient does not participate in regular exercise at present. He generally feels fairly well. He reports sleeping well. He does not have additional problems to discuss today.  He remains physically active.  He quit smoking a decade ago.  He drinks 1 drink of alcohol per day. His shingles pain is almost resolved.  He feels well since B12 deficiency has been treated.     Medications: Outpatient Medications Prior to Visit  Medication Sig  . amLODipine (NORVASC) 10 MG tablet Take 1 tablet (10 mg total) by mouth daily. (Patient not taking: No sig reported)  . ascorbic acid (VITAMIN C) 500 MG tablet Take 500 mg by mouth daily.  Marland Kitchen aspirin EC 81 MG tablet Take 81 mg by mouth. Three times weekly (Patient not taking: Reported on 07/14/2020)  . capsicum (ZOSTRIX) 0.075 % topical cream Apply 1 application topically 2 (two) times daily. (Patient not taking: Reported on 07/14/2020)  . Cholecalciferol 25 MCG (1000 UT) capsule Take 1,000 Units by mouth daily.   . cyanocobalamin 1000 MCG tablet Take 1,000 mcg by mouth daily.  Marland Kitchen doxazosin (CARDURA XL) 4 MG 24 hr tablet Take 1 tablet (4 mg total) by mouth daily with breakfast. (Patient not taking: Reported on 07/14/2020)  . ELIQUIS 5 MG TABS tablet Take 5 mg by mouth 2 (two) times daily.  Marland Kitchen gabapentin (NEURONTIN) 300 MG capsule Take 1 capsule (300 mg total) by mouth 3 (three) times daily. (Patient not taking: No sig reported)  . metoprolol succinate (TOPROL-XL) 25 MG 24 hr tablet Take 0.5 mg by mouth daily at 6 (six) AM.  . rosuvastatin (CRESTOR) 20 MG  tablet TAKE ONE TABLET EVERY DAY  . spironolactone (ALDACTONE) 25 MG tablet Take 25 mg by mouth daily.  Marland Kitchen telmisartan (MICARDIS) 80 MG tablet Take 1 tablet by mouth daily.   Facility-Administered Medications Prior to Visit  Medication Dose Route Frequency Provider  . aspirin chewable tablet 81 mg  81 mg Oral Once Jerrol Banana., MD    No Known Allergies  Patient Care Team: Jerrol Banana., MD as PCP - General (Family Medicine) Corey Skains, MD as Consulting Physician (Cardiology) Vladimir Crofts, MD as Consulting Physician (Neurology)  Review of Systems  All other systems reviewed and are negative.        Objective    Vitals: BP 130/62   Pulse 68   Temp 98.2 F (36.8 C)   Resp 16   Ht 5\' 10"  (1.778 m)   Wt 177 lb (80.3 kg)   BMI 25.40 kg/m  BP Readings from Last 3 Encounters:  07/14/20 130/62  07/14/20 130/62  06/02/20 123/69   Wt Readings from Last 3 Encounters:  07/14/20 177 lb (80.3 kg)  07/14/20 177 lb 6.4 oz (80.5 kg)  06/02/20 177 lb 3.2 oz (80.4 kg)      Physical Exam Vitals reviewed.  Constitutional:      Appearance: He is well-developed and normal weight.  HENT:     Head: Normocephalic  and atraumatic.     Right Ear: Tympanic membrane and external ear normal.     Left Ear: Tympanic membrane and external ear normal.     Nose: Nose normal.     Mouth/Throat:     Pharynx: Oropharynx is clear.  Eyes:     General: No scleral icterus.    Conjunctiva/sclera: Conjunctivae normal.  Neck:     Thyroid: No thyromegaly.  Cardiovascular:     Rate and Rhythm: Normal rate and regular rhythm.     Heart sounds: Normal heart sounds.  Pulmonary:     Effort: Pulmonary effort is normal.     Breath sounds: Normal breath sounds.  Abdominal:     Palpations: Abdomen is soft.  Genitourinary:    Penis: Normal.      Testes: Normal.  Musculoskeletal:        General: Normal range of motion.     Comments: Left clavicle obviously has had had AC severe  sprain in past.  Skin:    General: Skin is warm and dry.     Comments: Chronic  Cyst right upper back.  Neurological:     Mental Status: He is alert and oriented to person, place, and time. Mental status is at baseline.  Psychiatric:        Mood and Affect: Mood normal.        Behavior: Behavior normal.        Thought Content: Thought content normal.        Judgment: Judgment normal.      Most recent functional status assessment: In your present state of health, do you have any difficulty performing the following activities: 07/14/2020  Hearing? N  Vision? N  Difficulty concentrating or making decisions? N  Walking or climbing stairs? N  Dressing or bathing? N  Doing errands, shopping? N  Preparing Food and eating ? N  Using the Toilet? N  In the past six months, have you accidently leaked urine? N  Do you have problems with loss of bowel control? N  Managing your Medications? N  Managing your Finances? N  Housekeeping or managing your Housekeeping? N  Some recent data might be hidden   Most recent fall risk assessment: Fall Risk  07/14/2020  Falls in the past year? 0  Number falls in past yr: 0  Injury with Fall? 0  Follow up -    Most recent depression screenings: PHQ 2/9 Scores 07/14/2020 03/21/2020  PHQ - 2 Score 0 0  PHQ- 9 Score - 0   Most recent cognitive screening: 6CIT Screen 06/12/2017  What Year? 0 points  What month? 0 points  What time? 0 points  Count back from 20 0 points  Months in reverse 0 points  Repeat phrase 6 points  Total Score 6   Most recent Audit-C alcohol use screening Alcohol Use Disorder Test (AUDIT) 07/14/2020  1. How often do you have a drink containing alcohol? 4  2. How many drinks containing alcohol do you have on a typical day when you are drinking? 0  3. How often do you have six or more drinks on one occasion? 0  AUDIT-C Score 4  4. How often during the last year have you found that you were not able to stop drinking once you had  started? 0  5. How often during the last year have you failed to do what was normally expected from you because of drinking? 0  6. How often during the last year have  you needed a first drink in the morning to get yourself going after a heavy drinking session? 0  7. How often during the last year have you had a feeling of guilt of remorse after drinking? 0  8. How often during the last year have you been unable to remember what happened the night before because you had been drinking? 0  9. Have you or someone else been injured as a result of your drinking? 0  10. Has a relative or friend or a doctor or another health worker been concerned about your drinking or suggested you cut down? 0  Alcohol Use Disorder Identification Test Final Score (AUDIT) 4  Alcohol Brief Interventions/Follow-up AUDIT Score <7 follow-up not indicated   A score of 3 or more in women, and 4 or more in men indicates increased risk for alcohol abuse, EXCEPT if all of the points are from question 1   No results found for any visits on 07/14/20.  Assessment & Plan     Annual wellness visit done today including the all of the following: Reviewed patient's Family Medical History Reviewed and updated list of patient's medical providers Assessment of cognitive impairment was done Assessed patient's functional ability Established a written schedule for health screening [place] Completed and Reviewed  Exercise Activities and Dietary recommendations Goals    . DIET - INCREASE WATER INTAKE     Recommend to drink at least 6-8 8oz glasses of water per day.       Immunization History  Administered Date(s) Administered  . Fluad Quad(high Dose 65+) 02/16/2019, 03/21/2020  . Influenza, High Dose Seasonal PF 06/12/2017, 05/06/2018  . PFIZER(Purple Top)SARS-COV-2 Vaccination 06/11/2019, 07/09/2019  . Pneumococcal Polysaccharide-23 02/16/2019  . Tdap 03/31/2008  . Zoster 11/04/2012    Health  Maintenance  Topic Date Due  . COVID-19 Vaccine (3 - Pfizer risk 4-dose series) 08/06/2019  . TETANUS/TDAP  07/14/2021 (Originally 03/31/2018)  . PNA vac Low Risk Adult (2 of 2 - PCV13) 07/14/2021 (Originally 02/16/2020)  . Fecal DNA (Cologuard)  06/28/2023  . INFLUENZA VACCINE  Completed  . Hepatitis C Screening  Completed  . HPV VACCINES  Aged Out     Discussed health benefits of physical activity, and encouraged him to engage in regular exercise appropriate for his age and condition.    1. Annual physical exam   2. Essential hypertension Controlled- Comprehensive metabolic panel  3. Prostate cancer screening Last  screening PSA - PSA  4. 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  Risk factors are treated.  6. Presbycusis of both ears   7. B12 deficiency On B12   No follow-ups on file.     I, Wilhemena Durie, MD, have reviewed all documentation for this visit. The documentation on 07/16/20 for the exam, diagnosis, procedures, and orders are all accurate and complete.    Zaccary Creech Cranford Mon, MD  [facility] [phone] (phone) [phone] (fax)  [place]

## 2020-07-14 ENCOUNTER — Ambulatory Visit (INDEPENDENT_AMBULATORY_CARE_PROVIDER_SITE_OTHER): Payer: Medicare HMO

## 2020-07-14 ENCOUNTER — Ambulatory Visit (INDEPENDENT_AMBULATORY_CARE_PROVIDER_SITE_OTHER): Payer: Medicare HMO | Admitting: Family Medicine

## 2020-07-14 ENCOUNTER — Other Ambulatory Visit: Payer: Self-pay

## 2020-07-14 VITALS — BP 130/62 | HR 68 | Temp 98.2°F | Resp 16 | Ht 70.0 in | Wt 177.0 lb

## 2020-07-14 VITALS — BP 130/62 | HR 68 | Temp 98.2°F | Ht 70.0 in | Wt 177.4 lb

## 2020-07-14 DIAGNOSIS — Z0000 Encounter for general adult medical examination without abnormal findings: Secondary | ICD-10-CM

## 2020-07-14 DIAGNOSIS — E7800 Pure hypercholesterolemia, unspecified: Secondary | ICD-10-CM

## 2020-07-14 DIAGNOSIS — I2510 Atherosclerotic heart disease of native coronary artery without angina pectoris: Secondary | ICD-10-CM | POA: Diagnosis not present

## 2020-07-14 DIAGNOSIS — I10 Essential (primary) hypertension: Secondary | ICD-10-CM

## 2020-07-14 DIAGNOSIS — Z125 Encounter for screening for malignant neoplasm of prostate: Secondary | ICD-10-CM

## 2020-07-14 DIAGNOSIS — H9113 Presbycusis, bilateral: Secondary | ICD-10-CM

## 2020-07-14 DIAGNOSIS — E538 Deficiency of other specified B group vitamins: Secondary | ICD-10-CM

## 2020-07-14 NOTE — Patient Instructions (Signed)
Pedro Swanson , Thank you for taking time to come for your Medicare Wellness Visit. I appreciate your ongoing commitment to your health goals. Please review the following plan we discussed and let me know if I can assist you in the future.   Screening recommendations/referrals: Colonoscopy: Cologuard up to date, due 06/2023 Recommended yearly ophthalmology/optometry visit for glaucoma screening and checkup Recommended yearly dental visit for hygiene and checkup  Vaccinations: Influenza vaccine: Done 03/21/20 Pneumococcal vaccine: Prevnar 13 due. Declined receiving today.  Tdap vaccine: Currently due, declined today. Shingles vaccine: Shingrix discussed. Please contact your pharmacy for coverage information.    Advanced directives: Advance directive discussed with you today. Even though you declined this today please call our office should you change your mind and we can give you the proper paperwork for you to fill out.  Conditions/risks identified: Recommend to drink at least 6-8 8oz glasses of water per day.  Next appointment: 9:40 AM with Dr Rosanna Randy   Preventive Care 12 Years and Older, Male Preventive care refers to lifestyle choices and visits with your health care provider that can promote health and wellness. What does preventive care include?  A yearly physical exam. This is also called an annual well check.  Dental exams once or twice a year.  Routine eye exams. Ask your health care provider how often you should have your eyes checked.  Personal lifestyle choices, including:  Daily care of your teeth and gums.  Regular physical activity.  Eating a healthy diet.  Avoiding tobacco and drug use.  Limiting alcohol use.  Practicing safe sex.  Taking low doses of aspirin every day.  Taking vitamin and mineral supplements as recommended by your health care provider. What happens during an annual well check? The services and screenings done by your health care provider  during your annual well check will depend on your age, overall health, lifestyle risk factors, and family history of disease. Counseling  Your health care provider may ask you questions about your:  Alcohol use.  Tobacco use.  Drug use.  Emotional well-being.  Home and relationship well-being.  Sexual activity.  Eating habits.  History of falls.  Memory and ability to understand (cognition).  Work and work Statistician. Screening  You may have the following tests or measurements:  Height, weight, and BMI.  Blood pressure.  Lipid and cholesterol levels. These may be checked every 5 years, or more frequently if you are over 59 years old.  Skin check.  Lung cancer screening. You may have this screening every year starting at age 81 if you have a 30-pack-year history of smoking and currently smoke or have quit within the past 15 years.  Fecal occult blood test (FOBT) of the stool. You may have this test every year starting at age 38.  Flexible sigmoidoscopy or colonoscopy. You may have a sigmoidoscopy every 5 years or a colonoscopy every 10 years starting at age 56.  Prostate cancer screening. Recommendations will vary depending on your family history and other risks.  Hepatitis C blood test.  Hepatitis B blood test.  Sexually transmitted disease (STD) testing.  Diabetes screening. This is done by checking your blood sugar (glucose) after you have not eaten for a while (fasting). You may have this done every 1-3 years.  Abdominal aortic aneurysm (AAA) screening. You may need this if you are a current or former smoker.  Osteoporosis. You may be screened starting at age 51 if you are at high risk. Talk with your health  care provider about your test results, treatment options, and if necessary, the need for more tests. Vaccines  Your health care provider may recommend certain vaccines, such as:  Influenza vaccine. This is recommended every year.  Tetanus,  diphtheria, and acellular pertussis (Tdap, Td) vaccine. You may need a Td booster every 10 years.  Zoster vaccine. You may need this after age 42.  Pneumococcal 13-valent conjugate (PCV13) vaccine. One dose is recommended after age 68.  Pneumococcal polysaccharide (PPSV23) vaccine. One dose is recommended after age 44. Talk to your health care provider about which screenings and vaccines you need and how often you need them. This information is not intended to replace advice given to you by your health care provider. Make sure you discuss any questions you have with your health care provider. Document Released: 05/27/2015 Document Revised: 01/18/2016 Document Reviewed: 03/01/2015 Elsevier Interactive Patient Education  2017 [place] Prevention in the Home Falls can cause injuries. They can happen to people of all ages. There are many things you can do to make your home safe and to help prevent falls. What can I do on the outside of my home?  Regularly fix the edges of walkways and driveways and fix any cracks.  Remove anything that might make you trip as you walk through a door, such as a raised step or threshold.  Trim any bushes or trees on the path to your home.  Use bright outdoor lighting.  Clear any walking paths of anything that might make someone trip, such as rocks or tools.  Regularly check to see if handrails are loose or broken. Make sure that both sides of any steps have handrails.  Any raised decks and porches should have guardrails on the edges.  Have any leaves, snow, or ice cleared regularly.  Use sand or salt on walking paths during winter.  Clean up any spills in your garage right away. This includes oil or grease spills. What can I do in the bathroom?  Use night lights.  Install grab bars by the toilet and in the tub and shower. Do not use towel bars as grab bars.  Use non-skid mats or decals in the tub or shower.  If you need to sit down in  the shower, use a plastic, non-slip stool.  Keep the floor dry. Clean up any water that spills on the floor as soon as it happens.  Remove soap buildup in the tub or shower regularly.  Attach bath mats securely with double-sided non-slip rug tape.  Do not have throw rugs and other things on the floor that can make you trip. What can I do in the bedroom?  Use night lights.  Make sure that you have a light by your bed that is easy to reach.  Do not use any sheets or blankets that are too big for your bed. They should not hang down onto the floor.  Have a firm chair that has side arms. You can use this for support while you get dressed.  Do not have throw rugs and other things on the floor that can make you trip. What can I do in the kitchen?  Clean up any spills right away.  Avoid walking on wet floors.  Keep items that you use a lot in easy-to-reach places.  If you need to reach something above you, use a strong step stool that has a grab bar.  Keep electrical cords out of the way.  Do not use floor  polish or wax that makes floors slippery. If you must use wax, use non-skid floor wax.  Do not have throw rugs and other things on the floor that can make you trip. What can I do with my stairs?  Do not leave any items on the stairs.  Make sure that there are handrails on both sides of the stairs and use them. Fix handrails that are broken or loose. Make sure that handrails are as long as the stairways.  Check any carpeting to make sure that it is firmly attached to the stairs. Fix any carpet that is loose or worn.  Avoid having throw rugs at the top or bottom of the stairs. If you do have throw rugs, attach them to the floor with carpet tape.  Make sure that you have a light switch at the top of the stairs and the bottom of the stairs. If you do not have them, ask someone to add them for you. What else can I do to help prevent falls?  Wear shoes that:  Do not have high  heels.  Have rubber bottoms.  Are comfortable and fit you well.  Are closed at the toe. Do not wear sandals.  If you use a stepladder:  Make sure that it is fully opened. Do not climb a closed stepladder.  Make sure that both sides of the stepladder are locked into place.  Ask someone to hold it for you, if possible.  Clearly mark and make sure that you can see:  Any grab bars or handrails.  First and last steps.  Where the edge of each step is.  Use tools that help you move around (mobility aids) if they are needed. These include:  Canes.  Walkers.  Scooters.  Crutches.  Turn on the lights when you go into a dark area. Replace any light bulbs as soon as they burn out.  Set up your furniture so you have a clear path. Avoid moving your furniture around.  If any of your floors are uneven, fix them.  If there are any pets around you, be aware of where they are.  Review your medicines with your doctor. Some medicines can make you feel dizzy. This can increase your chance of falling. Ask your doctor what other things that you can do to help prevent falls. This information is not intended to replace advice given to you by your health care provider. Make sure you discuss any questions you have with your health care provider. Document Released: 02/24/2009 Document Revised: 10/06/2015 Document Reviewed: 06/04/2014 Elsevier Interactive Patient Education  2017 [employer].

## 2020-07-15 LAB — CBC WITH DIFFERENTIAL/PLATELET
Basophils Absolute: 0 10*3/uL (ref 0.0–0.2)
Basos: 0 %
EOS (ABSOLUTE): 0 10*3/uL (ref 0.0–0.4)
Eos: 0 %
Hematocrit: 41.6 % (ref 37.5–51.0)
Hemoglobin: 14.4 g/dL (ref 13.0–17.7)
Immature Grans (Abs): 0 10*3/uL (ref 0.0–0.1)
Immature Granulocytes: 0 %
Lymphocytes Absolute: 2.2 10*3/uL (ref 0.7–3.1)
Lymphs: 32 %
MCH: 31 pg (ref 26.6–33.0)
MCHC: 34.6 g/dL (ref 31.5–35.7)
MCV: 90 fL (ref 79–97)
Monocytes Absolute: 0.6 10*3/uL (ref 0.1–0.9)
Monocytes: 8 %
Neutrophils Absolute: 4.2 10*3/uL (ref 1.4–7.0)
Neutrophils: 60 %
Platelets: 273 10*3/uL (ref 150–450)
RBC: 4.65 x10E6/uL (ref 4.14–5.80)
RDW: 12.3 % (ref 11.6–15.4)
WBC: 7.1 10*3/uL (ref 3.4–10.8)

## 2020-07-15 LAB — LIPID PANEL
Chol/HDL Ratio: 3 ratio (ref 0.0–5.0)
Cholesterol, Total: 182 mg/dL (ref 100–199)
HDL: 61 mg/dL (ref >39)
LDL Chol Calc (NIH): 99 mg/dL (ref 0–99)
Triglycerides: 122 mg/dL (ref 0–149)
VLDL Cholesterol Cal: 22 mg/dL (ref 5–40)

## 2020-07-15 LAB — COMPREHENSIVE METABOLIC PANEL
ALT: 18 IU/L (ref 0–44)
AST: 16 IU/L (ref 0–40)
Albumin/Globulin Ratio: 2.1 (ref 1.2–2.2)
Albumin: 5 g/dL — ABNORMAL HIGH (ref 3.7–4.7)
Alkaline Phosphatase: 48 IU/L (ref 44–121)
BUN/Creatinine Ratio: 13 (ref 10–24)
BUN: 14 mg/dL (ref 8–27)
Bilirubin Total: 0.4 mg/dL (ref 0.0–1.2)
CO2: 22 mmol/L (ref 20–29)
Calcium: 10 mg/dL (ref 8.6–10.2)
Chloride: 102 mmol/L (ref 96–106)
Creatinine, Ser: 1.08 mg/dL (ref 0.76–1.27)
Globulin, Total: 2.4 g/dL (ref 1.5–4.5)
Glucose: 103 mg/dL — ABNORMAL HIGH (ref 65–99)
Potassium: 5.7 mmol/L — ABNORMAL HIGH (ref 3.5–5.2)
Sodium: 138 mmol/L (ref 134–144)
Total Protein: 7.4 g/dL (ref 6.0–8.5)
eGFR: 73 mL/min/{1.73_m2} (ref >59)

## 2020-07-15 LAB — PSA: Prostate Specific Ag, Serum: 2.1 ng/mL (ref 0.0–4.0)

## 2020-07-15 LAB — TSH: TSH: 2.19 u[IU]/mL (ref 0.450–4.500)

## 2020-07-20 ENCOUNTER — Telehealth: Payer: Self-pay

## 2020-07-21 ENCOUNTER — Ambulatory Visit: Payer: Self-pay | Admitting: *Deleted

## 2020-07-21 NOTE — Telephone Encounter (Signed)
Pt given lab results per notes of Dr. Rosanna Randy from 07/15/20 on 07/21/20. Pt verbalized understanding of lab results and to stop spironolactone . appt scheduled for  08/23/20. Patient requesting clinic to notify his cardiologist of medications that have been stopped, spironolactone. Patient reports he has been added eliquis and metoprolol since seeing the cardiologist.

## 2020-07-22 ENCOUNTER — Other Ambulatory Visit: Payer: Self-pay | Admitting: *Deleted

## 2020-07-22 DIAGNOSIS — Z87891 Personal history of nicotine dependence: Secondary | ICD-10-CM

## 2020-07-22 DIAGNOSIS — Z122 Encounter for screening for malignant neoplasm of respiratory organs: Secondary | ICD-10-CM

## 2020-07-22 NOTE — Progress Notes (Signed)
Contacted and scheduled for annual lung screening scan. Patient is a former smoker, quit 2012, 44 pack year history.

## 2020-08-10 ENCOUNTER — Ambulatory Visit
Admission: RE | Admit: 2020-08-10 | Discharge: 2020-08-10 | Disposition: A | Discharge status: Home / Self Care | Payer: Medicare HMO | Source: Ambulatory Visit | Attending: Oncology | Admitting: Oncology

## 2020-08-10 ENCOUNTER — Other Ambulatory Visit: Payer: Self-pay

## 2020-08-10 DIAGNOSIS — Z87891 Personal history of nicotine dependence: Secondary | ICD-10-CM

## 2020-08-10 DIAGNOSIS — Z122 Encounter for screening for malignant neoplasm of respiratory organs: Secondary | ICD-10-CM

## 2020-08-22 ENCOUNTER — Encounter: Payer: Self-pay | Admitting: *Deleted

## 2020-08-23 ENCOUNTER — Encounter: Payer: Self-pay | Admitting: Family Medicine

## 2020-08-23 ENCOUNTER — Ambulatory Visit (INDEPENDENT_AMBULATORY_CARE_PROVIDER_SITE_OTHER): Payer: Medicare HMO | Admitting: Family Medicine

## 2020-08-23 ENCOUNTER — Other Ambulatory Visit: Payer: Self-pay

## 2020-08-23 VITALS — BP 100/63 | HR 61 | Temp 98.0°F | Resp 18 | Ht 70.0 in | Wt 178.0 lb

## 2020-08-23 DIAGNOSIS — E875 Hyperkalemia: Secondary | ICD-10-CM | POA: Diagnosis not present

## 2020-08-23 DIAGNOSIS — E7800 Pure hypercholesterolemia, unspecified: Secondary | ICD-10-CM | POA: Diagnosis not present

## 2020-08-23 DIAGNOSIS — I10 Essential (primary) hypertension: Secondary | ICD-10-CM | POA: Diagnosis not present

## 2020-08-23 NOTE — Progress Notes (Signed)
I,April Miller,acting as a scribe for Wilhemena Durie, MD.,have documented all relevant documentation on the behalf of Wilhemena Durie, MD,as directed by  Wilhemena Durie, MD while in the presence of Wilhemena Durie, MD.   Established patient visit   Patient: Pedro Swanson [facility]   DOB: November 04, 1950   70 y.o. Male  MRN: [id number] Visit Date: 08/23/2020  Today's healthcare provider: Wilhemena Durie, MD   Chief Complaint  Patient presents with  . Follow-up   Subjective    HPI  Labs checked 07/14/2020 showing-stable but potassium high. Advised to stop spironolactone and see me back in a month.  He feels well and has no complaints.      Medications: Outpatient Medications Prior to Visit  Medication Sig  . ascorbic acid (VITAMIN C) 500 MG tablet Take 500 mg by mouth daily.  . Cholecalciferol 25 MCG (1000 UT) capsule Take 1,000 Units by mouth daily.   . cyanocobalamin 1000 MCG tablet Take 1,000 mcg by mouth daily.  Marland Kitchen ELIQUIS 5 MG TABS tablet Take 5 mg by mouth 2 (two) times daily.  . metoprolol succinate (TOPROL-XL) 25 MG 24 hr tablet Take 0.5 mg by mouth daily at 6 (six) AM.  . rosuvastatin (CRESTOR) 20 MG tablet TAKE ONE TABLET EVERY DAY  . spironolactone (ALDACTONE) 25 MG tablet Take 25 mg by mouth daily.  Marland Kitchen telmisartan (MICARDIS) 80 MG tablet Take 1 tablet by mouth daily.  Marland Kitchen amLODipine (NORVASC) 10 MG tablet Take 1 tablet (10 mg total) by mouth daily. (Patient not taking: No sig reported)  . aspirin EC 81 MG tablet Take 81 mg by mouth. Three times weekly (Patient not taking: No sig reported)  . capsicum (ZOSTRIX) 0.075 % topical cream Apply 1 application topically 2 (two) times daily. (Patient not taking: No sig reported)  . doxazosin (CARDURA XL) 4 MG 24 hr tablet Take 1 tablet (4 mg total) by mouth daily with breakfast. (Patient not taking: No sig reported)  . gabapentin (NEURONTIN) 300 MG capsule Take 1 capsule (300 mg total) by mouth 3 (three) times daily.  (Patient not taking: No sig reported)   Facility-Administered Medications Prior to Visit  Medication Dose Route Frequency Provider  . aspirin chewable tablet 81 mg  81 mg Oral Once Jerrol Banana., MD    Review of Systems  Constitutional: Negative for appetite change, chills and fever.  Respiratory: Negative for chest tightness, shortness of breath and wheezing.   Cardiovascular: Negative for chest pain and palpitations.  Gastrointestinal: Negative for abdominal pain, nausea and vomiting.        Objective    BP 100/63 (BP Location: Left Arm, Patient Position: Sitting, Cuff Size: Large)   Pulse 61   Temp 98 F (36.7 C) (Oral)   Resp 18   Ht 5\' 10"  (1.778 m)   Wt 178 lb (80.7 kg)   SpO2 99%   BMI 25.54 kg/m  BP Readings from Last 3 Encounters:  08/23/20 100/63  07/14/20 130/62  07/14/20 130/62   Wt Readings from Last 3 Encounters:  08/23/20 178 lb (80.7 kg)  08/10/20 173 lb (78.5 kg)  07/14/20 177 lb (80.3 kg)       Physical Exam Vitals reviewed.  Constitutional:      Appearance: He is well-developed and normal weight.  HENT:     Head: Normocephalic and atraumatic.     Right Ear: Tympanic membrane and external ear normal.     Left Ear: Tympanic membrane  and external ear normal.     Nose: Nose normal.     Mouth/Throat:     Pharynx: Oropharynx is clear.  Eyes:     General: No scleral icterus.    Conjunctiva/sclera: Conjunctivae normal.  Neck:     Thyroid: No thyromegaly.  Cardiovascular:     Rate and Rhythm: Normal rate and regular rhythm.     Heart sounds: Normal heart sounds.  Pulmonary:     Effort: Pulmonary effort is normal.     Breath sounds: Normal breath sounds.  Abdominal:     Palpations: Abdomen is soft.  Genitourinary:    Penis: Normal.      Testes: Normal.  Musculoskeletal:        General: Normal range of motion.     Comments: Left clavicle obviously has had had AC severe sprain in past.  Skin:    General: Skin is warm and dry.      Comments: Chronic  Cyst right upper back.  Neurological:     Mental Status: He is alert and oriented to person, place, and time. Mental status is at baseline.  Psychiatric:        Mood and Affect: Mood normal.        Behavior: Behavior normal.        Thought Content: Thought content normal.        Judgment: Judgment normal.       No results found for any visits on 08/23/20.  Assessment & Plan     1. Hyperkalemia Potassium off of spironolactone. - Renal function panel  2. Essential hypertension Good control on present meds.  Well off of spironolactone.  3. Hypercholesteremia Rosuvastatin 20   No follow-ups on file.      I, Wilhemena Durie, MD, have reviewed all documentation for this visit. The documentation on 08/29/20 for the exam, diagnosis, procedures, and orders are all accurate and complete.    Tyeasha Ebbs Cranford Mon, MD  [facility] [phone] (phone) [phone] (fax)  [place]

## 2020-08-25 LAB — RENAL FUNCTION PANEL
Albumin: 4.6 g/dL (ref 3.7–4.7)
BUN/Creatinine Ratio: 14 (ref 10–24)
BUN: 17 mg/dL (ref 8–27)
CO2: 21 mmol/L (ref 20–29)
Calcium: 9.4 mg/dL (ref 8.6–10.2)
Chloride: 103 mmol/L (ref 96–106)
Creatinine, Ser: 1.2 mg/dL (ref 0.76–1.27)
Glucose: 73 mg/dL (ref 65–99)
Phosphorus: 3.9 mg/dL (ref 2.8–4.1)
Potassium: 5 mmol/L (ref 3.5–5.2)
Sodium: 140 mmol/L (ref 134–144)
eGFR: 65 mL/min/{1.73_m2} (ref >59)

## 2020-09-05 IMAGING — CT CT CHEST LUNG CANCER SCREENING LOW DOSE W/O CM
2 of 5 series · 15 of 40 positions shown, 18 images · non-contrast
Comparison: 07/07/2018 screening chest CT.

CLINICAL DATA: 68-year-old asymptomatic male former smoker with 44
pack-year smoking history, quit smoking 9 years prior. Second
[id number]-SG vaccination in the left upper extremity 08/06/2019.

EXAM:
CT CHEST WITHOUT CONTRAST LOW-DOSE FOR LUNG CANCER SCREENING
TECHNIQUE: Multidetector CT imaging of the chest was performed following the
standard protocol without IV contrast.

[Series 3: lung 1.00 · axial · 0.65mm/px · z∈[-1272,-915]mm · 12 of 395 slices shown, 15 images]
[im 19/395  mediastinal]
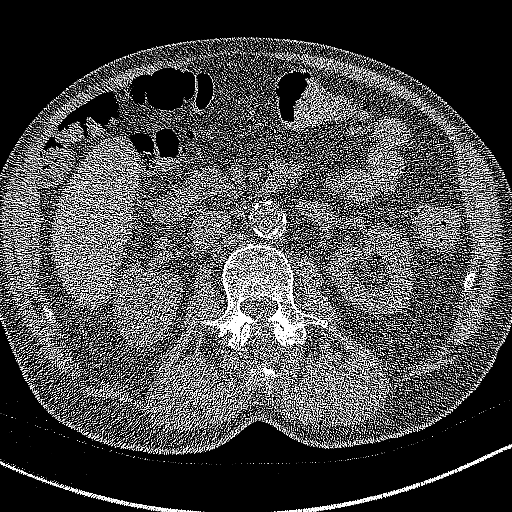
[im 19/395  lung]
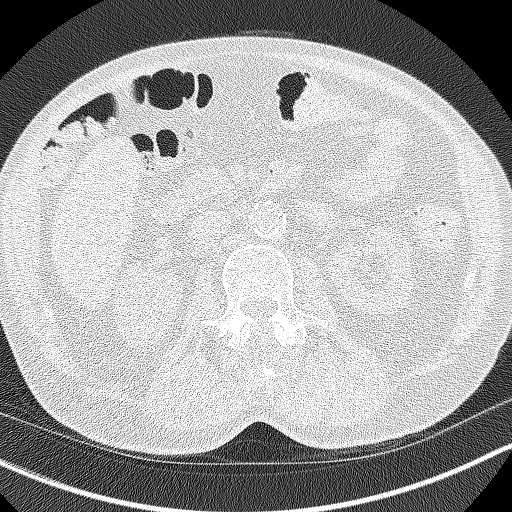
[im 57/395  lung]
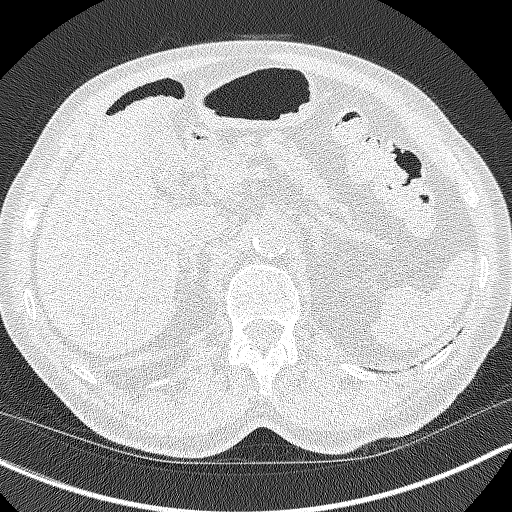
[im 94/395  lung]
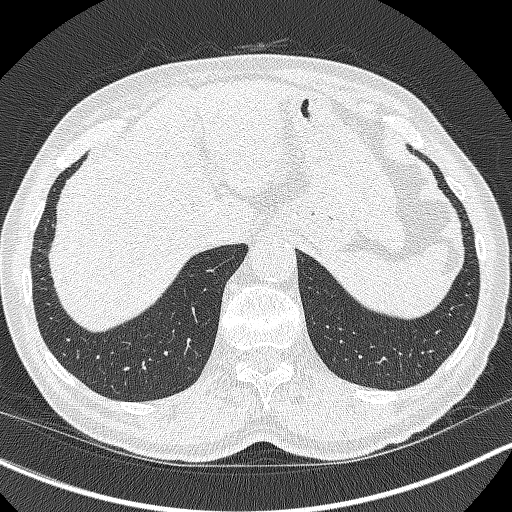
[im 113/395  lung]
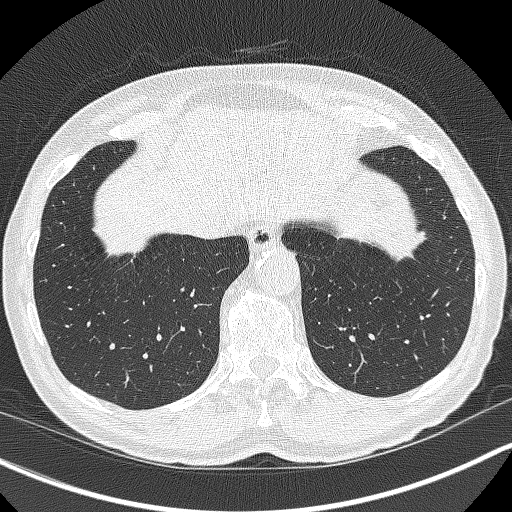
[im 151/395  mediastinal]
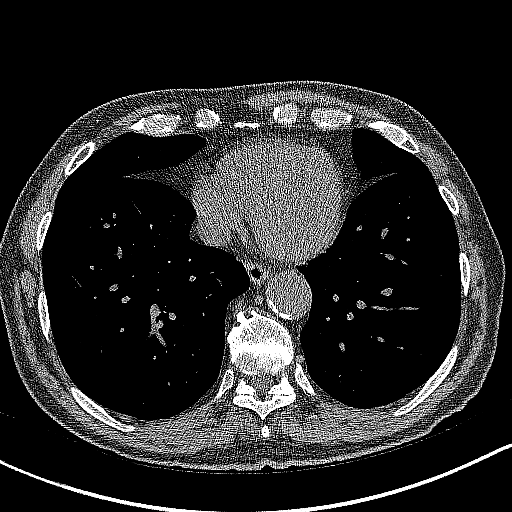
[im 151/395  lung]
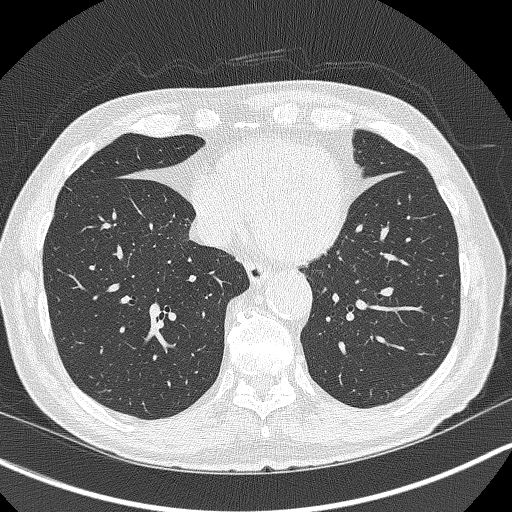
[im 188/395  lung]
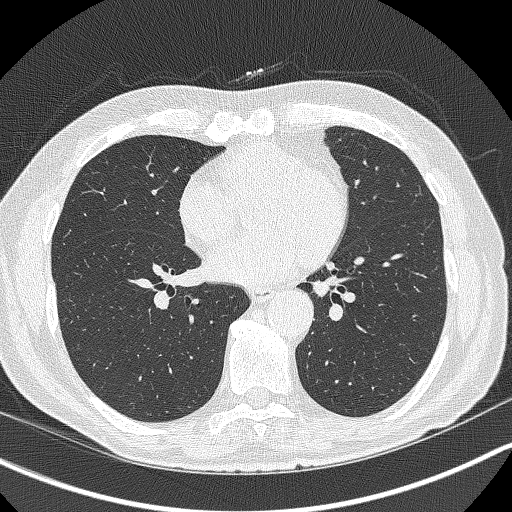
[im 207/395  lung]
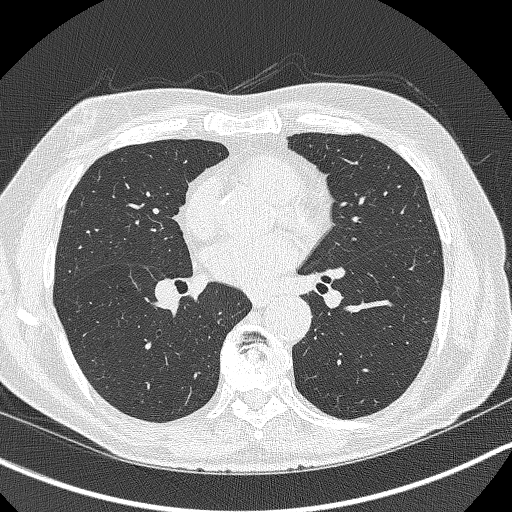
[im 244/395  lung]
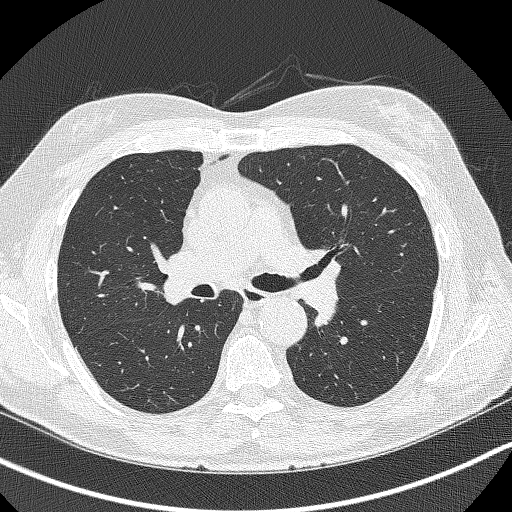
[im 282/395  mediastinal]
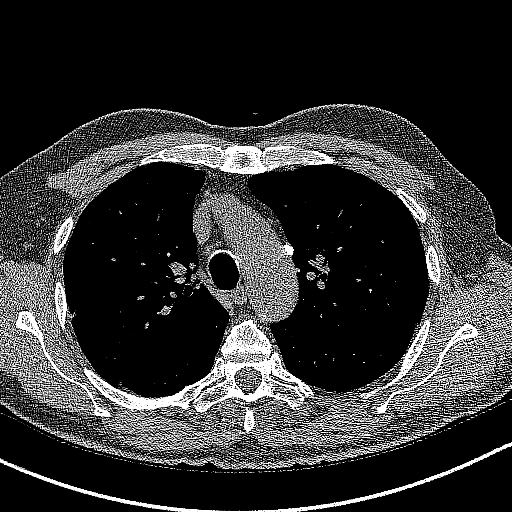
[im 282/395  lung]
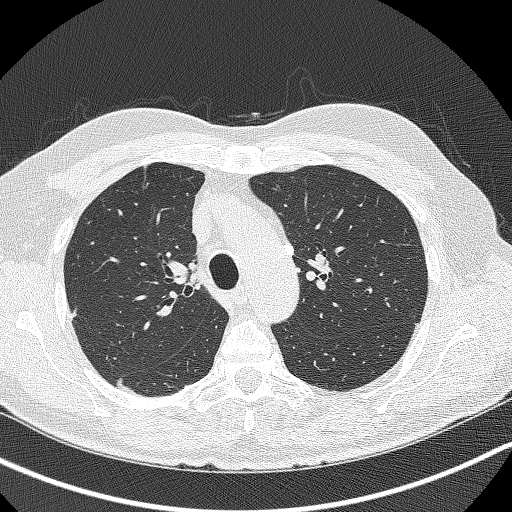
[im 301/395  lung]
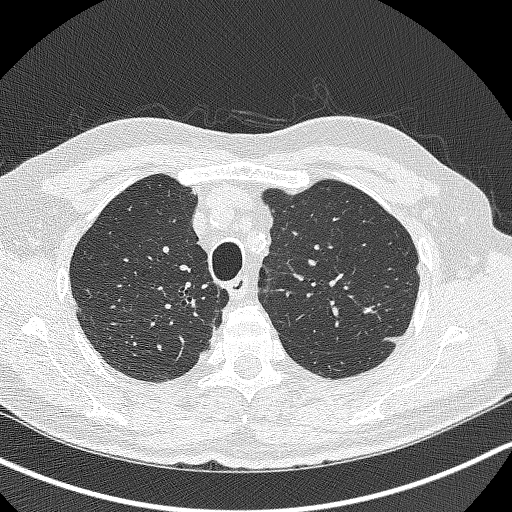
[im 338/395  lung]
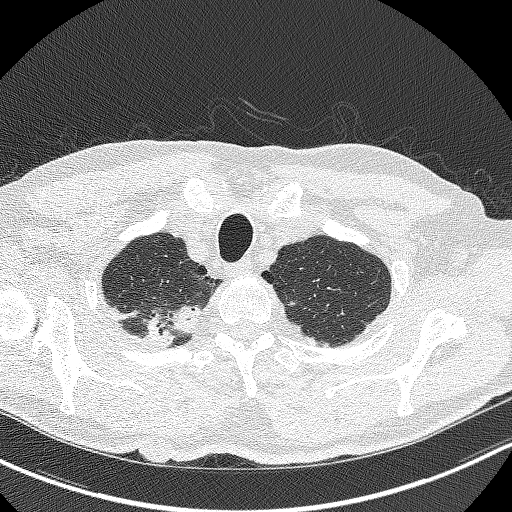
[im 376/395  lung]
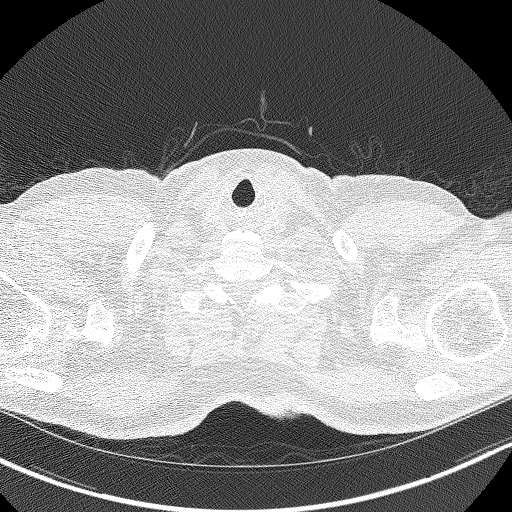

[Series 4: coronals lung 1.00 cor · coronal · 0.65mm/px · 3 of 286 slices shown]
[im 58/286  lung]
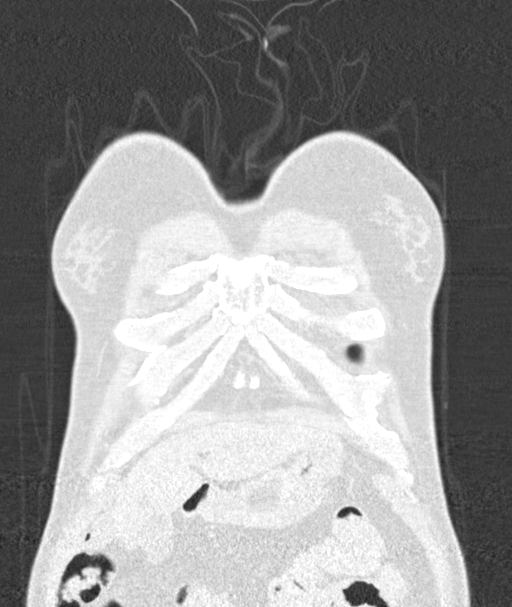
[im 115/286  lung]
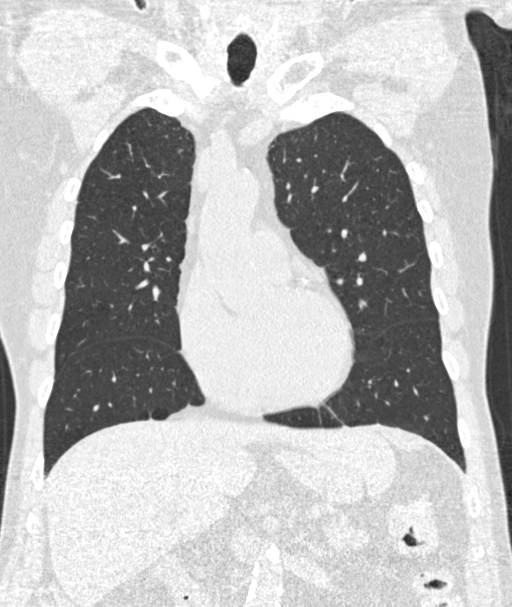
[im 172/286  lung]
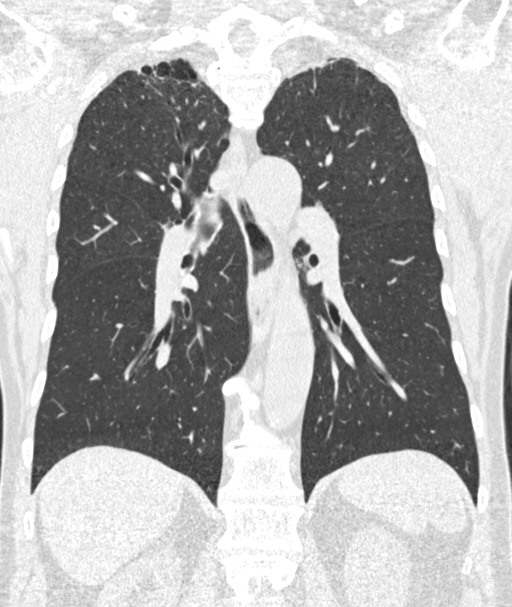

[15 of 40 positions shown; findings below may reference images not displayed]

FINDINGS: Cardiovascular: Normal heart size. No significant pericardial
effusion/thickening. Left anterior descending coronary
atherosclerosis. Atherosclerotic nonaneurysmal thoracic aorta.
Normal caliber pulmonary arteries.

Mediastinum/Nodes: No discrete thyroid nodules. Unremarkable
esophagus. No pathologically enlarged axillary, mediastinal or hilar
lymph nodes, noting limited sensitivity for the detection of hilar
adenopathy on this noncontrast study. Stable nonenlarged coarsely
calcified AP window and left hilar nodes from prior granulomatous
disease.

Lungs/Pleura: No pneumothorax. No pleural effusion. Mild
centrilobular and paraseptal emphysema. No acute consolidative
airspace disease or lung masses. No significant growth of previously
visualized pulmonary nodules. No new significant pulmonary nodules.

Upper abdomen: No acute abnormality. Stable scattered small simple
liver cysts, largest 1.5 cm in the lower right liver lobe.

Musculoskeletal: No aggressive appearing focal osseous lesions.
Moderate symmetric bilateral gynecomastia, increased. Superficial
low-attenuation 1.8 cm upper right back lesion (series 2/image 12),
stable. Marked thoracic spondylosis.
IMPRESSION: 1. Lung-RADS 2, benign appearance or behavior. Continue annual
screening with low-dose chest CT without contrast in 12 months.
2. One vessel coronary atherosclerosis.
3. Moderate symmetric bilateral gynecomastia, increased.
4. Aortic Atherosclerosis (BUV73-1Q5.5) and Emphysema (BUV73-ZX0.B).

## 2020-09-30 NOTE — Telephone Encounter (Signed)
error 

## 2020-12-14 DIAGNOSIS — G4733 Obstructive sleep apnea (adult) (pediatric): Secondary | ICD-10-CM | POA: Insufficient documentation

## 2021-01-17 NOTE — Progress Notes (Signed)
Established patient visit   Patient: Pedro Swanson   DOB: 06/01/1950   70 y.o. Male  MRN: [id number] Visit Date: 01/18/2021  Today's healthcare provider: Wilhemena Durie, MD   Chief Complaint  Patient presents with   Hypertension   Hyperlipidemia   Subjective    HPI  Since last visit patient has had a home sleep study and is getting CPAP arranged for use. On Eliquis for A. fib.  He feels well overall. Hypertension, follow-up  BP Readings from Last 3 Encounters:  01/18/21 136/67  08/23/20 100/63  07/14/20 130/62   Wt Readings from Last 3 Encounters:  01/18/21 180 lb (81.6 kg)  08/23/20 178 lb (80.7 kg)  08/10/20 173 lb (78.5 kg)     He was last seen for hypertension 5 months ago.  BP at that visit was 100/63. Management since that visit includes no medication changes.  He reports good compliance with treatment. He is not having side effects.  He is following a Regular diet. He is not exercising. He does not smoke.  Use of agents associated with hypertension: none.   Outside blood pressures are checked occasionally.  Pertinent labs: Lab Results  Component Value Date   CHOL 182 07/14/2020   HDL 61 07/14/2020   LDLCALC 99 07/14/2020   TRIG 122 07/14/2020   CHOLHDL 3.0 07/14/2020   Lab Results  Component Value Date   NA 140 08/24/2020   K 5.0 08/24/2020   CREATININE 1.20 08/24/2020   GFRNONAA 67 07/15/2019   GFRAA 77 07/15/2019   GLUCOSE 73 08/24/2020     The 10-year ASCVD risk score Mikey Bussing DC Jr., et al., 2013) is: 23.3%   Lipid/Cholesterol, Follow-up  Last lipid panel Other pertinent labs  Lab Results  Component Value Date   CHOL 182 07/14/2020   HDL 61 07/14/2020   LDLCALC 99 07/14/2020   TRIG 122 07/14/2020   CHOLHDL 3.0 07/14/2020   Lab Results  Component Value Date   ALT 18 07/14/2020   AST 16 07/14/2020   PLT 273 07/14/2020   TSH 2.190 07/14/2020     He was last seen for this 5 months ago.  Management since that visit  includes no medication changes.  He reports good compliance with treatment. He is not having side effects.     Medications: Outpatient Medications Prior to Visit  Medication Sig   amLODipine (NORVASC) 10 MG tablet Take 1 tablet (10 mg total) by mouth daily. (Patient not taking: No sig reported)   ascorbic acid (VITAMIN C) 500 MG tablet Take 500 mg by mouth daily.   aspirin EC 81 MG tablet Take 81 mg by mouth. Three times weekly (Patient not taking: No sig reported)   capsicum (ZOSTRIX) 0.075 % topical cream Apply 1 application topically 2 (two) times daily. (Patient not taking: No sig reported)   Cholecalciferol 25 MCG (1000 UT) capsule Take 1,000 Units by mouth daily.    cyanocobalamin 1000 MCG tablet Take 1,000 mcg by mouth daily.   doxazosin (CARDURA XL) 4 MG 24 hr tablet Take 1 tablet (4 mg total) by mouth daily with breakfast. (Patient not taking: No sig reported)   ELIQUIS 5 MG TABS tablet Take 5 mg by mouth 2 (two) times daily.   gabapentin (NEURONTIN) 300 MG capsule Take 1 capsule (300 mg total) by mouth 3 (three) times daily. (Patient not taking: No sig reported)   metoprolol succinate (TOPROL-XL) 25 MG 24 hr tablet Take 0.5 mg by mouth  daily at 6 (six) AM.   rosuvastatin (CRESTOR) 20 MG tablet TAKE ONE TABLET EVERY DAY   spironolactone (ALDACTONE) 25 MG tablet Take 25 mg by mouth daily.   telmisartan (MICARDIS) 80 MG tablet Take 1 tablet by mouth daily.   Facility-Administered Medications Prior to Visit  Medication Dose Route Frequency Provider   aspirin chewable tablet 81 mg  81 mg Oral Once Jerrol Banana., MD    Review of Systems  Constitutional:  Negative for activity change and fatigue.  Respiratory:  Negative for cough and shortness of breath.   Cardiovascular:  Negative for chest pain, palpitations and leg swelling.  Musculoskeletal:  Negative for arthralgias and back pain.  Neurological:  Negative for dizziness, light-headedness and headaches.        Objective    BP 136/67   Pulse 69   Temp 98.4 F (36.9 C)   Resp 16   Ht 6' (1.829 m)   Wt 180 lb (81.6 kg)   BMI 24.41 kg/m  BP Readings from Last 3 Encounters:  01/18/21 136/67  08/23/20 100/63  07/14/20 130/62   Wt Readings from Last 3 Encounters:  01/18/21 180 lb (81.6 kg)  08/23/20 178 lb (80.7 kg)  08/10/20 173 lb (78.5 kg)      Physical Exam Vitals reviewed.  Constitutional:      Appearance: He is well-developed and normal weight.  HENT:     Head: Normocephalic and atraumatic.     Right Ear: Tympanic membrane and external ear normal.     Left Ear: Tympanic membrane and external ear normal.     Nose: Nose normal.     Mouth/Throat:     Pharynx: Oropharynx is clear.  Eyes:     General: No scleral icterus.    Conjunctiva/sclera: Conjunctivae normal.  Neck:     Thyroid: No thyromegaly.  Cardiovascular:     Rate and Rhythm: Normal rate and regular rhythm.     Heart sounds: Normal heart sounds.  Pulmonary:     Effort: Pulmonary effort is normal.     Breath sounds: Normal breath sounds.  Abdominal:     Palpations: Abdomen is soft.  Genitourinary:    Penis: Normal.      Testes: Normal.  Musculoskeletal:        General: Normal range of motion.     Comments: Left clavicle obviously has had had AC severe sprain in past.  Skin:    General: Skin is warm and dry.     Comments: Chronic  Cyst right upper back.  Neurological:     Mental Status: He is alert and oriented to person, place, and time. Mental status is at baseline.  Psychiatric:        Mood and Affect: Mood normal.        Behavior: Behavior normal.        Thought Content: Thought content normal.        Judgment: Judgment normal.      No results found for any visits on 01/18/21.  Assessment & Plan     1. Essential hypertension Controlled on amlodipine and metoprolol and Aldactone and Micardis  2. Hypercholesteremia On rosuvastatin 20  3. Arteriosclerotic cardiovascular disease (ASCVD) All  risk factors treated  4. Presbycusis of both ears   5. OSA (obstructive sleep apnea) Awaiting CPAP 6.afib Now on Eliquis. Complete physical in 6 months. No follow-ups on file.      I, Wilhemena Durie, MD, have reviewed all documentation for this  visit. The documentation on 01/22/21 for the exam, diagnosis, procedures, and orders are all accurate and complete.    Lorne Winkels Cranford Mon, MD  [facility] [phone] (phone) [phone] (fax)  [place]

## 2021-01-18 ENCOUNTER — Other Ambulatory Visit: Payer: Self-pay

## 2021-01-18 ENCOUNTER — Ambulatory Visit (INDEPENDENT_AMBULATORY_CARE_PROVIDER_SITE_OTHER): Payer: Medicare HMO | Admitting: Family Medicine

## 2021-01-18 ENCOUNTER — Encounter: Payer: Self-pay | Admitting: Family Medicine

## 2021-01-18 VITALS — BP 136/67 | HR 69 | Temp 98.4°F | Resp 16 | Ht 72.0 in | Wt 180.0 lb

## 2021-01-18 DIAGNOSIS — H9113 Presbycusis, bilateral: Secondary | ICD-10-CM

## 2021-01-18 DIAGNOSIS — G4733 Obstructive sleep apnea (adult) (pediatric): Secondary | ICD-10-CM

## 2021-01-18 DIAGNOSIS — I10 Essential (primary) hypertension: Secondary | ICD-10-CM

## 2021-01-18 DIAGNOSIS — E7800 Pure hypercholesterolemia, unspecified: Secondary | ICD-10-CM | POA: Diagnosis not present

## 2021-01-18 DIAGNOSIS — I2510 Atherosclerotic heart disease of native coronary artery without angina pectoris: Secondary | ICD-10-CM

## 2021-02-22 ENCOUNTER — Ambulatory Visit (INDEPENDENT_AMBULATORY_CARE_PROVIDER_SITE_OTHER): Payer: Medicare HMO

## 2021-02-22 ENCOUNTER — Other Ambulatory Visit: Payer: Self-pay

## 2021-02-22 DIAGNOSIS — Z23 Encounter for immunization: Secondary | ICD-10-CM

## 2021-06-19 ENCOUNTER — Other Ambulatory Visit: Payer: Self-pay | Admitting: Family Medicine

## 2021-06-19 DIAGNOSIS — E7800 Pure hypercholesterolemia, unspecified: Secondary | ICD-10-CM

## 2021-07-07 NOTE — Telephone Encounter (Signed)
Signing encounter see previous note on 07/20/20 °

## 2021-07-20 ENCOUNTER — Telehealth: Payer: Self-pay | Admitting: *Deleted

## 2021-07-20 ENCOUNTER — Ambulatory Visit (INDEPENDENT_AMBULATORY_CARE_PROVIDER_SITE_OTHER): Payer: Medicare HMO

## 2021-07-20 ENCOUNTER — Ambulatory Visit (INDEPENDENT_AMBULATORY_CARE_PROVIDER_SITE_OTHER): Payer: Medicare HMO | Admitting: Family Medicine

## 2021-07-20 ENCOUNTER — Other Ambulatory Visit: Payer: Self-pay

## 2021-07-20 VITALS — BP 134/78 | HR 60 | Temp 97.6°F | Wt 175.8 lb

## 2021-07-20 DIAGNOSIS — Z0000 Encounter for general adult medical examination without abnormal findings: Secondary | ICD-10-CM

## 2021-07-20 DIAGNOSIS — G20 Parkinson's disease: Secondary | ICD-10-CM | POA: Diagnosis not present

## 2021-07-20 DIAGNOSIS — I2510 Atherosclerotic heart disease of native coronary artery without angina pectoris: Secondary | ICD-10-CM

## 2021-07-20 DIAGNOSIS — E875 Hyperkalemia: Secondary | ICD-10-CM

## 2021-07-20 DIAGNOSIS — I480 Paroxysmal atrial fibrillation: Secondary | ICD-10-CM

## 2021-07-20 DIAGNOSIS — E7800 Pure hypercholesterolemia, unspecified: Secondary | ICD-10-CM | POA: Diagnosis not present

## 2021-07-20 DIAGNOSIS — G4733 Obstructive sleep apnea (adult) (pediatric): Secondary | ICD-10-CM

## 2021-07-20 DIAGNOSIS — I10 Essential (primary) hypertension: Secondary | ICD-10-CM | POA: Diagnosis not present

## 2021-07-20 NOTE — Chronic Care Management (AMB) (Signed)
?  Chronic Care Management  ? ?Note ? ?07/20/2021 ?Name: Micco Bourbeau MRN: [id number] DOB: 04/23/1951 ? ?[place] Skalicky Hoctor is a 71 y.o. year old male who is a primary care patient of Jerrol Banana., MD. I reached out to [facility] by phone today in response to a referral sent by Mr. Reynold Bowen Bensch's PCP. ? ?Mr. Dykes was given information about Chronic Care Management services today including:  ?CCM service includes personalized support from designated clinical staff supervised by his physician, including individualized plan of care and coordination with other care providers ?24/7 contact phone numbers for assistance for urgent and routine care needs. ?Service will only be billed when office clinical staff spend 20 minutes or more in a month to coordinate care. ?Only one practitioner may furnish and bill the service in a calendar month. ?The patient may stop CCM services at any time (effective at the end of the month) by phone call to the office staff. ?The patient is responsible for co-pay (up to 20% after annual deductible is met) if co-pay is required by the individual health plan.  ? ?Patient agreed to services and verbal consent obtained.  ? ?Follow up plan: ?Telephone appointment with care management team member scheduled for: 07/24/2021 ? ?Kasiyah Platter, CCMA ?Care Guide, Embedded Care Coordination ?[facility]  Care Management  ?Direct Dial: [phone] ? ? ?

## 2021-07-20 NOTE — Patient Instructions (Signed)
Pedro Swanson , Thank you for taking time to come for your Medicare Wellness Visit. I appreciate your ongoing commitment to your health goals. Please review the following plan we discussed and let me know if I can assist you in the future.   Screening recommendations/referrals: Colonoscopy: Cologuard 06/28/20 Recommended yearly ophthalmology/optometry visit for glaucoma screening and checkup Recommended yearly dental visit for hygiene and checkup  Vaccinations: Influenza vaccine: 02/22/21 Pneumococcal vaccine: 02/16/19 Tdap vaccine: 03/31/08, due Shingles vaccine: n/d   Covid-19: 06/11/19, 07/09/19  Advanced directives: no  Conditions/risks identified: none  Next appointment: Follow up in one year for your annual wellness visit. 07/25/22 @ 10:20am in person  Preventive Care 65 Years and Older, Male Preventive care refers to lifestyle choices and visits with your health care provider that can promote health and wellness. What does preventive care include? A yearly physical exam. This is also called an annual well check. Dental exams once or twice a year. Routine eye exams. Ask your health care provider how often you should have your eyes checked. Personal lifestyle choices, including: Daily care of your teeth and gums. Regular physical activity. Eating a healthy diet. Avoiding tobacco and drug use. Limiting alcohol use. Practicing safe sex. Taking low doses of aspirin every day. Taking vitamin and mineral supplements as recommended by your health care provider. What happens during an annual well check? The services and screenings done by your health care provider during your annual well check will depend on your age, overall health, lifestyle risk factors, and family history of disease. Counseling  Your health care provider may ask you questions about your: Alcohol use. Tobacco use. Drug use. Emotional well-being. Home and relationship well-being. Sexual activity. Eating  habits. History of falls. Memory and ability to understand (cognition). Work and work Statistician. Screening  You may have the following tests or measurements: Height, weight, and BMI. Blood pressure. Lipid and cholesterol levels. These may be checked every 5 years, or more frequently if you are over 75 years old. Skin check. Lung cancer screening. You may have this screening every year starting at age 73 if you have a 30-pack-year history of smoking and currently smoke or have quit within the past 15 years. Fecal occult blood test (FOBT) of the stool. You may have this test every year starting at age 73. Flexible sigmoidoscopy or colonoscopy. You may have a sigmoidoscopy every 5 years or a colonoscopy every 10 years starting at age 73. Prostate cancer screening. Recommendations will vary depending on your family history and other risks. Hepatitis C blood test. Hepatitis B blood test. Sexually transmitted disease (STD) testing. Diabetes screening. This is done by checking your blood sugar (glucose) after you have not eaten for a while (fasting). You may have this done every 1-3 years. Abdominal aortic aneurysm (AAA) screening. You may need this if you are a current or former smoker. Osteoporosis. You may be screened starting at age 73 if you are at high risk. Talk with your health care provider about your test results, treatment options, and if necessary, the need for more tests. Vaccines  Your health care provider may recommend certain vaccines, such as: Influenza vaccine. This is recommended every year. Tetanus, diphtheria, and acellular pertussis (Tdap, Td) vaccine. You may need a Td booster every 10 years. Zoster vaccine. You may need this after age 56. Pneumococcal 13-valent conjugate (PCV13) vaccine. One dose is recommended after age 28. Pneumococcal polysaccharide (PPSV23) vaccine. One dose is recommended after age 13. Talk to your health care  provider about which screenings and  vaccines you need and how often you need them. This information is not intended to replace advice given to you by your health care provider. Make sure you discuss any questions you have with your health care provider. Document Released: 05/27/2015 Document Revised: 01/18/2016 Document Reviewed: 03/01/2015 Elsevier Interactive Patient Education  2017 [employer] Prevention in the Home Falls can cause injuries. They can happen to people of all ages. There are many things you can do to make your home safe and to help prevent falls. What can I do on the outside of my home? Regularly fix the edges of walkways and driveways and fix any cracks. Remove anything that might make you trip as you walk through a door, such as a raised step or threshold. Trim any bushes or trees on the path to your home. Use bright outdoor lighting. Clear any walking paths of anything that might make someone trip, such as rocks or tools. Regularly check to see if handrails are loose or broken. Make sure that both sides of any steps have handrails. Any raised decks and porches should have guardrails on the edges. Have any leaves, snow, or ice cleared regularly. Use sand or salt on walking paths during winter. Clean up any spills in your garage right away. This includes oil or grease spills. What can I do in the bathroom? Use night lights. Install grab bars by the toilet and in the tub and shower. Do not use towel bars as grab bars. Use non-skid mats or decals in the tub or shower. If you need to sit down in the shower, use a plastic, non-slip stool. Keep the floor dry. Clean up any water that spills on the floor as soon as it happens. Remove soap buildup in the tub or shower regularly. Attach bath mats securely with double-sided non-slip rug tape. Do not have throw rugs and other things on the floor that can make you trip. What can I do in the bedroom? Use night lights. Make sure that you have a light by your  bed that is easy to reach. Do not use any sheets or blankets that are too big for your bed. They should not hang down onto the floor. Have a firm chair that has side arms. You can use this for support while you get dressed. Do not have throw rugs and other things on the floor that can make you trip. What can I do in the kitchen? Clean up any spills right away. Avoid walking on wet floors. Keep items that you use a lot in easy-to-reach places. If you need to reach something above you, use a strong step stool that has a grab bar. Keep electrical cords out of the way. Do not use floor polish or wax that makes floors slippery. If you must use wax, use non-skid floor wax. Do not have throw rugs and other things on the floor that can make you trip. What can I do with my stairs? Do not leave any items on the stairs. Make sure that there are handrails on both sides of the stairs and use them. Fix handrails that are broken or loose. Make sure that handrails are as long as the stairways. Check any carpeting to make sure that it is firmly attached to the stairs. Fix any carpet that is loose or worn. Avoid having throw rugs at the top or bottom of the stairs. If you do have throw rugs, attach them to the  floor with carpet tape. Make sure that you have a light switch at the top of the stairs and the bottom of the stairs. If you do not have them, ask someone to add them for you. What else can I do to help prevent falls? Wear shoes that: Do not have high heels. Have rubber bottoms. Are comfortable and fit you well. Are closed at the toe. Do not wear sandals. If you use a stepladder: Make sure that it is fully opened. Do not climb a closed stepladder. Make sure that both sides of the stepladder are locked into place. Ask someone to hold it for you, if possible. Clearly mark and make sure that you can see: Any grab bars or handrails. First and last steps. Where the edge of each step is. Use tools that  help you move around (mobility aids) if they are needed. These include: Canes. Walkers. Scooters. Crutches. Turn on the lights when you go into a dark area. Replace any light bulbs as soon as they burn out. Set up your furniture so you have a clear path. Avoid moving your furniture around. If any of your floors are uneven, fix them. If there are any pets around you, be aware of where they are. Review your medicines with your doctor. Some medicines can make you feel dizzy. This can increase your chance of falling. Ask your doctor what other things that you can do to help prevent falls. This information is not intended to replace advice given to you by your health care provider. Make sure you discuss any questions you have with your health care provider. Document Released: 02/24/2009 Document Revised: 10/06/2015 Document Reviewed: 06/04/2014 Elsevier Interactive Patient Education  2017 [employer].

## 2021-07-20 NOTE — Progress Notes (Signed)
Complete physical exam  I,Pedro Swanson,acting as a scribe for Pedro Durie, MD.,have documented all relevant documentation on the behalf of Pedro Durie, MD,as directed by  Pedro Durie, MD while in the presence of Pedro Durie, MD.   Patient: Pedro Swanson   DOB: 1950-07-02   70 y.o. Male  MRN: [id number] Visit Date: 07/20/2021  Today's healthcare provider: Wilhemena Durie, MD   Chief Complaint  Patient presents with   Annual Exam   Pedro Swanson is a 71 y.o. male who presents today for a complete physical exam.  He reports consuming a general diet. The patient does not participate in regular exercise at present. He generally feels fairly well. He reports sleeping fairly well. He does not have additional problems to discuss today.  HPI  Patient had AWV with [facility] today.  Past Medical History:  Diagnosis Date   Cataract    COVID    Hyperlipidemia    Hypertension    Palpitations    Shingles    Past Surgical History:  Procedure Laterality Date   CATARACT EXTRACTION     right eye   Social History   Socioeconomic History   Marital status: Married    Spouse name: Not on file   Number of children: 2   Years of education: Not on file   Highest education level: Bachelor's degree (e.g., BA, AB, BS)  Occupational History   Occupation: retired  Tobacco Use   Smoking status: Former    Packs/day: 1.00    Years: 44.00    Pack years: 44.00    Types: Cigarettes    Quit date: 2012    Years since quitting: 11.1   Smokeless tobacco: Never  Vaping Use   Vaping Use: Never used  Substance and Sexual Activity   Alcohol use: Yes    Alcohol/week: 7.0 standard drinks    Types: 7 Shots of liquor per week    Comment: half drink daily   Drug use: No   Sexual activity: Not on file  Other Topics Concern   Not on file  Social History Narrative   Not on file   Social Determinants of Health   Financial Resource Strain: Low  Risk    Difficulty of Paying Living Expenses: Not hard at all  Food Insecurity: No Food Insecurity   Worried About Charity fundraiser in the Last Year: Never true   [place] in the Last Year: Never true  Transportation Needs: No Transportation Needs   Lack of Transportation (Medical): No   Lack of Transportation (Non-Medical): No  Physical Activity: Insufficiently Active   Days of Exercise per Week: 2 days   Minutes of Exercise per Session: 20 min  Stress: No Stress Concern Present   Feeling of Stress : Not at all  Social Connections: Moderately Isolated   Frequency of Communication with Friends and Family: Three times a week   Frequency of Social Gatherings with Friends and Family: More than three times a week   Attends Religious Services: Never   Marine scientist or Organizations: No   Attends Archivist Meetings: Never   Marital Status: Married  Human resources officer Violence: Not At Risk   Fear of Current or Ex-Partner: No   Emotionally Abused: No   Physically Abused: No   Sexually Abused: No   Family Status  Relation Name Status   Mother  Deceased   Father  Deceased   Brother  Deceased   Daughter  Alive   Son  Alive   Brother  36   Family History  Problem Relation Age of Onset   CAD Father    Hypertension Father    No Known Allergies  Patient Care Team: Jerrol Banana., MD as PCP - General (Family Medicine) Corey Skains, MD as Consulting Physician (Cardiology) Vladimir Crofts, MD as Consulting Physician (Neurology)   Medications: Outpatient Medications Prior to Visit  Medication Sig   amLODipine (NORVASC) 10 MG tablet Take 1 tablet (10 mg total) by mouth daily.   ascorbic acid (VITAMIN C) 500 MG tablet Take 500 mg by mouth daily.   aspirin EC 81 MG tablet Take 81 mg by mouth. Three times weekly   capsicum (ZOSTRIX) 0.075 % topical cream Apply 1 application topically 2 (two) times daily. (Patient not taking: Reported on  07/14/2020)   Cholecalciferol 25 MCG (1000 UT) capsule Take 1,000 Units by mouth daily.    cyanocobalamin 1000 MCG tablet Take 1,000 mcg by mouth daily.   doxazosin (CARDURA XL) 4 MG 24 hr tablet Take 1 tablet (4 mg total) by mouth daily with breakfast. (Patient not taking: Reported on 07/14/2020)   ELIQUIS 5 MG TABS tablet Take 5 mg by mouth 2 (two) times daily.   gabapentin (NEURONTIN) 300 MG capsule Take 1 capsule (300 mg total) by mouth 3 (three) times daily. (Patient not taking: Reported on 06/02/2020)   metoprolol succinate (TOPROL-XL) 25 MG 24 hr tablet Take 0.5 mg by mouth daily at 6 (six) AM.   metoprolol tartrate (LOPRESSOR) 25 MG tablet Take 1 tablet by mouth 2 (two) times daily.   rosuvastatin (CRESTOR) 20 MG tablet TAKE ONE TABLET EVERY DAY   spironolactone (ALDACTONE) 25 MG tablet Take 25 mg by mouth daily.   telmisartan (MICARDIS) 80 MG tablet Take 1 tablet by mouth daily.   Facility-Administered Medications Prior to Visit  Medication Dose Route Frequency Provider   aspirin chewable tablet 81 mg  81 mg Oral Once Jerrol Banana., MD    Review of Systems  HENT:  Positive for postnasal drip.   Genitourinary:  Positive for difficulty urinating.  All other systems reviewed and are negative.     Objective     Vitals:  BP 134/78 Pulse 60 Temp 97.6 F (36.4 C) (Oral) Wt 175 lb 12.8 oz (79.7 kg) SpO2 100% BMI 23.84 kg/m BSA 2.01 m    BP Readings from Last 3 Encounters:  07/20/21 134/78  01/18/21 136/67  08/23/20 100/63   Wt Readings from Last 3 Encounters:  07/20/21 175 lb 12.8 oz (79.7 kg)  01/18/21 180 lb (81.6 kg)  08/23/20 178 lb (80.7 kg)       Physical Exam Vitals reviewed.  Constitutional:      Appearance: He is well-developed and normal weight.  HENT:     Head: Normocephalic and atraumatic.     Right Ear: Tympanic membrane and external ear normal.     Left Ear: Tympanic membrane and external ear normal.     Nose: Nose normal.      Mouth/Throat:     Pharynx: Oropharynx is clear.  Eyes:     General: No scleral icterus.    Conjunctiva/sclera: Conjunctivae normal.  Neck:     Thyroid: No thyromegaly.  Cardiovascular:     Rate and Rhythm: Normal rate and regular rhythm.     Heart sounds: Normal heart sounds.  Pulmonary:  Effort: Pulmonary effort is normal.     Breath sounds: Normal breath sounds.  Abdominal:     Palpations: Abdomen is soft.  Genitourinary:    Penis: Normal.      Testes: Normal.  Musculoskeletal:        General: Normal range of motion.     Comments: Left clavicle obviously has had had AC severe sprain in past.  Skin:    General: Skin is warm and dry.     Comments: Chronic  Cyst right upper back.  Neurological:     Mental Status: He is alert and oriented to person, place, and time. Mental status is at baseline.  Psychiatric:        Mood and Affect: Mood normal.        Behavior: Behavior normal.        Thought Content: Thought content normal.        Judgment: Judgment normal.      Last depression screening scores PHQ 2/9 Scores 07/20/2021 07/14/2020 03/21/2020  PHQ - 2 Score 0 0 0  PHQ- 9 Score - - 0   Last fall risk screening Fall Risk  07/20/2021  Falls in the past year? 0  Number falls in past yr: 0  Injury with Fall? 0  Risk for fall due to : No Fall Risks  Follow up Falls evaluation completed   Last Audit-C alcohol use screening Alcohol Use Disorder Test (AUDIT) 07/20/2021  1. How often do you have a drink containing alcohol? 3  2. How many drinks containing alcohol do you have on a typical day when you are drinking? 0  3. How often do you have six or more drinks on one occasion? 0  AUDIT-C Score 3  4. How often during the last year have you found that you were not able to stop drinking once you had started? 0  5. How often during the last year have you failed to do what was normally expected from you because of drinking? 0  6. How often during the last year have you needed a first  drink in the morning to get yourself going after a heavy drinking session? 0  7. How often during the last year have you had a feeling of guilt of remorse after drinking? 0  8. How often during the last year have you been unable to remember what happened the night before because you had been drinking? 0  9. Have you or someone else been injured as a result of your drinking? 0  10. Has a relative or friend or a doctor or another health worker been concerned about your drinking or suggested you cut down? 0  Alcohol Use Disorder Identification Test Final Score (AUDIT) 3  Alcohol Brief Interventions/Follow-up -   A score of 3 or more in women, and 4 or more in men indicates increased risk for alcohol abuse, EXCEPT if all of the points are from question 1   No results found for any visits on 07/20/21.  Assessment & Plan    Routine Health Maintenance and Physical Exam  Exercise Activities and Dietary recommendations  Goals      DIET - EAT MORE FRUITS AND VEGETABLES     DIET - INCREASE WATER INTAKE     Recommend to drink at least 6-8 8oz glasses of water per day.        Immunization History  Administered Date(s) Administered   Fluad Quad(high Dose 65+) 02/16/2019, 03/21/2020, 02/22/2021   Influenza, High Dose  Seasonal PF 06/12/2017, 05/06/2018   PFIZER(Purple Top)SARS-COV-2 Vaccination 06/11/2019, 07/09/2019   Pneumococcal Polysaccharide-23 02/16/2019   Tdap 03/31/2008   Zoster, Live 11/04/2012    Health Maintenance  Topic Date Due   Zoster Vaccines- Shingrix (1 of 2) Never done   TETANUS/TDAP  03/31/2018   COVID-19 Vaccine (3 - Pfizer risk series) 08/06/2019   Pneumonia Vaccine 48+ Years old (2 - PCV) 02/16/2020   Fecal DNA (Cologuard)  06/28/2023   INFLUENZA VACCINE  Completed   Hepatitis C Screening  Completed   HPV VACCINES  Aged Out    Discussed health benefits of physical activity, and encouraged him to engage in regular exercise appropriate for his age and  condition.  1. Annual physical exam  - Lipid panel - TSH - CBC w/Diff/Platelet - Comprehensive Metabolic Panel (CMET)  2. Essential hypertension  - Lipid panel - TSH - CBC w/Diff/Platelet - Comprehensive Metabolic Panel (CMET)  3. Hypercholesteremia  - Lipid panel - TSH - CBC w/Diff/Platelet - Comprehensive Metabolic Panel (CMET)  4. Parkinson's disease (HCC)  - Lipid panel - TSH - CBC w/Diff/Platelet - Comprehensive Metabolic Panel (CMET)  5. Arteriosclerotic cardiovascular disease (ASCVD)  - Lipid panel - TSH - CBC w/Diff/Platelet - Comprehensive Metabolic Panel (CMET)  6. OSA (obstructive sleep apnea)  - Lipid panel - TSH - CBC w/Diff/Platelet - Comprehensive Metabolic Panel (CMET)  7. Hyperkalemia  - Lipid panel - TSH - CBC w/Diff/Platelet - Comprehensive Metabolic Panel (CMET)  8. Paroxysmal atrial fibrillation (HCC)  - AMB Referral to [facility]   Return in about 6 months (around 01/20/2022).     I, Pedro Durie, MD, have reviewed all documentation for this visit. The documentation on 07/23/21 for the exam, diagnosis, procedures, and orders are all accurate and complete.    Shay Jhaveri Cranford Mon, MD  [facility] [phone] (phone) [phone] (fax)  [place]

## 2021-07-20 NOTE — Progress Notes (Signed)
Subjective:   Dreyton Roessner Popoff is a 71 y.o. male who presents for Medicare Annual/Subsequent preventive examination.  Review of Systems           Objective:    Today's Vitals   07/20/21 1005  BP: 134/78  Pulse: 60  Temp: 97.6 F (36.4 C)  TempSrc: Oral  SpO2: 100%  Weight: 175 lb 12.8 oz (79.7 kg)   Body mass index is 23.84 kg/m.  Advanced Directives 07/14/2020 07/14/2019 07/07/2018 07/30/2016  Does Patient Have a Medical Advance Directive? No No No No  Would patient like information on creating a medical advance directive? No - Patient declined No - Patient declined No - Patient declined -    Current Medications (verified) Outpatient Encounter Medications as of 07/20/2021  Medication Sig   metoprolol tartrate (LOPRESSOR) 25 MG tablet Take 1 tablet by mouth 2 (two) times daily.   amLODipine (NORVASC) 10 MG tablet Take 1 tablet (10 mg total) by mouth daily. (Patient not taking: No sig reported)   ascorbic acid (VITAMIN C) 500 MG tablet Take 500 mg by mouth daily.   aspirin EC 81 MG tablet Take 81 mg by mouth. Three times weekly (Patient not taking: No sig reported)   capsicum (ZOSTRIX) 0.075 % topical cream Apply 1 application topically 2 (two) times daily. (Patient not taking: No sig reported)   Cholecalciferol 25 MCG (1000 UT) capsule Take 1,000 Units by mouth daily.    cyanocobalamin 1000 MCG tablet Take 1,000 mcg by mouth daily.   doxazosin (CARDURA XL) 4 MG 24 hr tablet Take 1 tablet (4 mg total) by mouth daily with breakfast. (Patient not taking: No sig reported)   ELIQUIS 5 MG TABS tablet Take 5 mg by mouth 2 (two) times daily.   gabapentin (NEURONTIN) 300 MG capsule Take 1 capsule (300 mg total) by mouth 3 (three) times daily. (Patient not taking: No sig reported)   metoprolol succinate (TOPROL-XL) 25 MG 24 hr tablet Take 0.5 mg by mouth daily at 6 (six) AM.   rosuvastatin (CRESTOR) 20 MG tablet TAKE ONE TABLET EVERY DAY   spironolactone (ALDACTONE) 25 MG tablet Take  25 mg by mouth daily.   telmisartan (MICARDIS) 80 MG tablet Take 1 tablet by mouth daily.   Facility-Administered Encounter Medications as of 07/20/2021  Medication   aspirin chewable tablet 81 mg    Allergies (verified) Patient has no known allergies.   History: Past Medical History:  Diagnosis Date   Cataract    COVID    Hyperlipidemia    Hypertension    Palpitations    Shingles    Past Surgical History:  Procedure Laterality Date   CATARACT EXTRACTION     right eye   Family History  Problem Relation Age of Onset   CAD Father    Hypertension Father    Social History   Socioeconomic History   Marital status: Married    Spouse name: Not on file   Number of children: 2   Years of education: Not on file   Highest education level: Bachelor's degree (e.g., BA, AB, BS)  Occupational History   Occupation: retired  Tobacco Use   Smoking status: Former    Packs/day: 1.00    Years: 44.00    Pack years: 44.00    Types: Cigarettes    Quit date: 2012    Years since quitting: 11.1   Smokeless tobacco: Never  Vaping Use   Vaping Use: Never used  Substance and Sexual Activity  Alcohol use: Yes    Alcohol/week: 7.0 standard drinks    Types: 7 Shots of liquor per week    Comment: half drink daily   Drug use: No   Sexual activity: Not on file  Other Topics Concern   Not on file  Social History Narrative   Not on file   Social Determinants of Health   Financial Resource Strain: Not on file  Food Insecurity: Not on file  Transportation Needs: Not on file  Physical Activity: Not on file  Stress: Not on file  Social Connections: Not on file    Tobacco Counseling Counseling given: Not Answered   Clinical Intake:  Pre-visit preparation completed: Yes  Pain : No/denies pain     Nutritional Risks: None Diabetes: No  How often do you need to have someone help you when you read instructions, pamphlets, or other written materials from your doctor or  pharmacy?: 1 - Never  Diabetic?no  Interpreter Needed?: No  Information entered by :: Kirke Shaggy, LPN   Activities of Daily Living No flowsheet data found.  Patient Care Team: Jerrol Banana., MD as PCP - General (Family Medicine) Corey Skains, MD as Consulting Physician (Cardiology) Vladimir Crofts, MD as Consulting Physician (Neurology)  Indicate any recent Medical Services you may have received from other than [facility] providers in the past year (date may be approximate).     Assessment:   This is a routine wellness examination for Adrik.  Hearing/Vision screen No results found.  Dietary issues and exercise activities discussed:     Goals Addressed   None    Depression Screen PHQ 2/9 Scores 07/14/2020 03/21/2020 07/14/2019 07/07/2018 07/07/2018 06/12/2017 06/12/2016  PHQ - 2 Score 0 0 0 0 0 0 0  PHQ- 9 Score - 0 - 2 - 3 -    Fall Risk Fall Risk  07/14/2020 03/21/2020 07/14/2019 07/07/2018 07/07/2018  Falls in the past year? 0 0 0 0 0  Number falls in past yr: 0 0 0 - -  Injury with Fall? 0 0 0 - -  Follow up - Falls evaluation completed - - -    FALL RISK PREVENTION PERTAINING TO THE HOME:  Any stairs in or around the home? No  If so, are there any without handrails? No  Home free of loose throw rugs in walkways, pet beds, electrical cords, etc? Yes  Adequate lighting in your home to reduce risk of falls? Yes   ASSISTIVE DEVICES UTILIZED TO PREVENT FALLS:  Life alert? No  Use of a cane, walker or w/c? No  Grab bars in the bathroom? No  Shower chair or bench in shower? Yes  Elevated toilet seat or a handicapped toilet? Yes   TIMED UP AND GO:  Was the test performed? Yes .  Length of time to ambulate 10 feet: 4 sec.   Gait steady and fast without use of assistive device  Cognitive Function:     6CIT Screen 06/12/2017  What Year? 0 points  What month? 0 points  What time? 0 points  Count back from 20 0 points  Months in reverse 0 points  Repeat  phrase 6 points  Total Score 6    Immunizations Immunization History  Administered Date(s) Administered   Fluad Quad(high Dose 65+) 02/16/2019, 03/21/2020, 02/22/2021   Influenza, High Dose Seasonal PF 06/12/2017, 05/06/2018   PFIZER(Purple Top)SARS-COV-2 Vaccination 06/11/2019, 07/09/2019   Pneumococcal Polysaccharide-23 02/16/2019   Tdap 03/31/2008   Zoster, Live 11/04/2012  TDAP status: Due, Education has been provided regarding the importance of this vaccine. Advised may receive this vaccine at local pharmacy or Health Dept. Aware to provide a copy of the vaccination record if obtained from local pharmacy or Health Dept. Verbalized acceptance and understanding.  Flu Vaccine status: Up to date  Pneumococcal vaccine status: Up to date  Covid-19 vaccine status: Completed vaccines  Qualifies for Shingles Vaccine? Yes   Zostavax completed No   Shingrix Completed?: No.    Education has been provided regarding the importance of this vaccine. Patient has been advised to call insurance company to determine out of pocket expense if they have not yet received this vaccine. Advised may also receive vaccine at local pharmacy or Health Dept. Verbalized acceptance and understanding.  Screening Tests Health Maintenance  Topic Date Due   Zoster Vaccines- Shingrix (1 of 2) Never done   TETANUS/TDAP  03/31/2018   COVID-19 Vaccine (3 - Pfizer risk series) 08/06/2019   Pneumonia Vaccine 95+ Years old (2 - PCV) 02/16/2020   Fecal DNA (Cologuard)  06/28/2023   INFLUENZA VACCINE  Completed   Hepatitis C Screening  Completed   HPV VACCINES  Aged Out    Health Maintenance  Health Maintenance Due  Topic Date Due   Zoster Vaccines- Shingrix (1 of 2) Never done   TETANUS/TDAP  03/31/2018   COVID-19 Vaccine (3 - Pfizer risk series) 08/06/2019   Pneumonia Vaccine 4+ Years old (2 - PCV) 02/16/2020    Colorectal cancer screening: Type of screening: Cologuard. Completed 06/28/20. Repeat every 3  years  Lung Cancer Screening: (Low Dose CT Chest recommended if Age 2-80 years, 30 pack-year currently smoking OR have quit w/in 15years.) does not qualify.    Additional Screening:  Hepatitis C Screening: does qualify; Completed 05/29/13  Vision Screening: Recommended annual ophthalmology exams for early detection of glaucoma and other disorders of the eye. Is the patient up to date with their annual eye exam?  Yes  Who is the provider or what is the name of the office in which the patient attends annual eye exams? [facility] MD If pt is not established with a provider, would they like to be referred to a provider to establish care? No .   Dental Screening: Recommended annual dental exams for proper oral hygiene  Community Resource Referral / Chronic Care Management: CRR required this visit?  No   CCM required this visit?  No      Plan:     I have personally reviewed and noted the following in the patient's chart:   Medical and social history Use of alcohol, tobacco or illicit drugs  Current medications and supplements including opioid prescriptions. Patient is not currently taking opioid prescriptions. Functional ability and status Nutritional status Physical activity Advanced directives List of other physicians Hospitalizations, surgeries, and ER visits in previous 12 months Vitals Screenings to include cognitive, depression, and falls Referrals and appointments  In addition, I have reviewed and discussed with patient certain preventive protocols, quality metrics, and best practice recommendations. A written personalized care plan for preventive services as well as general preventive health recommendations were provided to patient.     Dionisio David, LPN   08/14/3293   Nurse Notes: none

## 2021-07-21 LAB — CBC WITH DIFFERENTIAL/PLATELET
Basophils Absolute: 0 10*3/uL (ref 0.0–0.2)
Basos: 0 %
EOS (ABSOLUTE): 0 10*3/uL (ref 0.0–0.4)
Eos: 0 %
Hematocrit: 43.1 % (ref 37.5–51.0)
Hemoglobin: 14.4 g/dL (ref 13.0–17.7)
Immature Grans (Abs): 0 10*3/uL (ref 0.0–0.1)
Immature Granulocytes: 0 %
Lymphocytes Absolute: 1.7 10*3/uL (ref 0.7–3.1)
Lymphs: 28 %
MCH: 29.9 pg (ref 26.6–33.0)
MCHC: 33.4 g/dL (ref 31.5–35.7)
MCV: 89 fL (ref 79–97)
Monocytes Absolute: 0.5 10*3/uL (ref 0.1–0.9)
Monocytes: 8 %
Neutrophils Absolute: 3.8 10*3/uL (ref 1.4–7.0)
Neutrophils: 64 %
Platelets: 235 10*3/uL (ref 150–450)
RBC: 4.82 x10E6/uL (ref 4.14–5.80)
RDW: 12.3 % (ref 11.6–15.4)
WBC: 6 10*3/uL (ref 3.4–10.8)

## 2021-07-21 LAB — LIPID PANEL
Chol/HDL Ratio: 2.3 ratio (ref 0.0–5.0)
Cholesterol, Total: 144 mg/dL (ref 100–199)
HDL: 64 mg/dL (ref >39)
LDL Chol Calc (NIH): 65 mg/dL (ref 0–99)
Triglycerides: 79 mg/dL (ref 0–149)
VLDL Cholesterol Cal: 15 mg/dL (ref 5–40)

## 2021-07-21 LAB — COMPREHENSIVE METABOLIC PANEL
ALT: 17 IU/L (ref 0–44)
AST: 22 IU/L (ref 0–40)
Albumin/Globulin Ratio: 2.2 (ref 1.2–2.2)
Albumin: 4.7 g/dL (ref 3.7–4.7)
Alkaline Phosphatase: 50 IU/L (ref 44–121)
BUN/Creatinine Ratio: 13 (ref 10–24)
BUN: 13 mg/dL (ref 8–27)
Bilirubin Total: 0.4 mg/dL (ref 0.0–1.2)
CO2: 24 mmol/L (ref 20–29)
Calcium: 9.8 mg/dL (ref 8.6–10.2)
Chloride: 101 mmol/L (ref 96–106)
Creatinine, Ser: 1.04 mg/dL (ref 0.76–1.27)
Globulin, Total: 2.1 g/dL (ref 1.5–4.5)
Glucose: 104 mg/dL — ABNORMAL HIGH (ref 70–99)
Potassium: 5.2 mmol/L (ref 3.5–5.2)
Sodium: 142 mmol/L (ref 134–144)
Total Protein: 6.8 g/dL (ref 6.0–8.5)
eGFR: 76 mL/min/{1.73_m2} (ref >59)

## 2021-07-21 LAB — TSH: TSH: 1.44 u[IU]/mL (ref 0.450–4.500)

## 2021-07-24 ENCOUNTER — Telehealth: Payer: Self-pay

## 2021-07-24 ENCOUNTER — Ambulatory Visit (INDEPENDENT_AMBULATORY_CARE_PROVIDER_SITE_OTHER): Payer: Medicare HMO

## 2021-07-24 DIAGNOSIS — I10 Essential (primary) hypertension: Secondary | ICD-10-CM

## 2021-07-24 DIAGNOSIS — I480 Paroxysmal atrial fibrillation: Secondary | ICD-10-CM

## 2021-07-24 DIAGNOSIS — E7800 Pure hypercholesterolemia, unspecified: Secondary | ICD-10-CM

## 2021-07-24 DIAGNOSIS — I2510 Atherosclerotic heart disease of native coronary artery without angina pectoris: Secondary | ICD-10-CM

## 2021-07-24 NOTE — Progress Notes (Signed)
Chronic Care Management Pharmacy Note  07/24/2021 Name:  Pedro Swanson MRN:  [id number] DOB:  [birth year]  Summary: Patient presents for initial CCM consult. Patient does not qualify for patient assistance for Eliquis.   Recommendations/Changes made from today's visit: Continue current medications   Plan: No further follow up required: Patient will follow-up with clinical pharmacy team as needed   Subjective: Pedro Swanson is an 71 y.o. year old male who is a primary patient of Jerrol Banana., MD.  The CCM team was consulted for assistance with disease management and care coordination needs.    Engaged with patient by telephone for initial visit in response to provider referral for pharmacy case management and/or care coordination services.   Consent to Services:  The patient was given the following information about Chronic Care Management services today, agreed to services, and gave verbal consent: 1. CCM service includes personalized support from designated clinical staff supervised by the primary care provider, including individualized plan of care and coordination with other care providers 2. 24/7 contact phone numbers for assistance for urgent and routine care needs. 3. Service will only be billed when office clinical staff spend 20 minutes or more in a month to coordinate care. 4. Only one practitioner may furnish and bill the service in a calendar month. 5.The patient may stop CCM services at any time (effective at the end of the month) by phone call to the office staff. 6. The patient will be responsible for cost sharing (co-pay) of up to 20% of the service fee (after annual deductible is met). Patient agreed to services and consent obtained.  Patient Care Team: Jerrol Banana., MD as PCP - General (Family Medicine) Corey Skains, MD as Consulting Physician (Cardiology) Vladimir Crofts, MD as Consulting Physician (Neurology) Germaine Pomfret, [facility]  (Pharmacist)  Recent office visits: 07/20/2021 Kirke Shaggy, LPN (PCP Clinical Support) for Medicare Annual Wellness- No medication changes, noted, no orders placed, no follow-up noted           07/20/2021 Miguel Aschoff, MD (PCP Office Visit) for Annual Exam- No medication changes noted, lab orders placed, patient to follow-up in 6 months  Recent consult visits: 03/22/2021 Flossie Dibble, MD (Cardiology) I do not have the appropriate security to download this document  Hospital visits: None in previous 6 months   Objective:  Lab Results  Component Value Date   CREATININE 1.04 07/20/2021   BUN 13 07/20/2021   EGFR 76 07/20/2021   GFRNONAA 67 07/15/2019   GFRAA 77 07/15/2019   NA 142 07/20/2021   K 5.2 07/20/2021   CALCIUM 9.8 07/20/2021   CO2 24 07/20/2021   GLUCOSE 104 (H) 07/20/2021    No results found for: HGBA1C, FRUCTOSAMINE, GFR, MICROALBUR  Last diabetic Eye exam: No results found for: HMDIABEYEEXA  Last diabetic Foot exam: No results found for: HMDIABFOOTEX   Lab Results  Component Value Date   CHOL 144 07/20/2021   HDL 64 07/20/2021   LDLCALC 65 07/20/2021   TRIG 79 07/20/2021   CHOLHDL 2.3 07/20/2021    Hepatic Function Latest Ref Rng & Units 07/20/2021 08/24/2020 07/14/2020  Total Protein 6.0 - 8.5 g/dL 6.8 - 7.4  Albumin 3.7 - 4.7 g/dL 4.7 4.6 5.0(H)  AST 0 - 40 IU/L 22 - 16  ALT 0 - 44 IU/L 17 - 18  Alk Phosphatase 44 - 121 IU/L 50 - 48  Total Bilirubin 0.0 - 1.2 mg/dL 0.4 - 0.4  Lab Results  Component Value Date/Time   TSH 1.440 07/20/2021 11:19 AM   TSH 2.190 07/14/2020 11:08 AM    CBC Latest Ref Rng & Units 07/20/2021 07/14/2020 07/15/2019  WBC 3.4 - 10.8 x10E3/uL 6.0 7.1 6.4  Hemoglobin 13.0 - 17.7 g/dL 14.4 14.4 14.2  Hematocrit 37.5 - 51.0 % 43.1 41.6 42.1  Platelets 150 - 450 x10E3/uL 235 273 247    No results found for: VD25OH  Clinical ASCVD: No  The 10-year ASCVD risk score (Arnett DK, et al., 2019) is: 20.3%   Values used to  calculate the score:     Age: 87 years     Sex: Male     Is Non-Hispanic African American: No     Diabetic: No     Tobacco smoker: No     Systolic Blood Pressure: 889 mmHg     Is BP treated: Yes     HDL Cholesterol: 64 mg/dL     Total Cholesterol: 144 mg/dL    Depression screen [facility] 2/9 07/20/2021 07/14/2020 03/21/2020  Decreased Interest 0 0 0  Down, Depressed, Hopeless 0 0 0  PHQ - 2 Score 0 0 0  Altered sleeping - - 0  Tired, decreased energy - - 0  Change in appetite - - 0  Feeling bad or failure about yourself  - - 0  Trouble concentrating - - 0  Moving slowly or fidgety/restless - - 0  Suicidal thoughts - - 0  PHQ-9 Score - - 0  Difficult doing work/chores - - Not difficult at all    Social History   Tobacco Use  Smoking Status Former   Packs/day: 1.00   Years: 44.00   Pack years: 44.00   Types: Cigarettes   Quit date: 2012   Years since quitting: 11.2  Smokeless Tobacco Never   BP Readings from Last 3 Encounters:  07/20/21 134/78  01/18/21 136/67  08/23/20 100/63   Pulse Readings from Last 3 Encounters:  07/20/21 60  01/18/21 69  08/23/20 61   Wt Readings from Last 3 Encounters:  07/20/21 175 lb 12.8 oz (79.7 kg)  01/18/21 180 lb (81.6 kg)  08/23/20 178 lb (80.7 kg)   BMI Readings from Last 3 Encounters:  07/20/21 23.84 kg/m  01/18/21 24.41 kg/m  08/23/20 25.54 kg/m    Assessment/Interventions: Review of patient past medical history, allergies, medications, health status, including review of consultants reports, laboratory and other test data, was performed as part of comprehensive evaluation and provision of chronic care management services.   SDOH:  (Social Determinants of Health) assessments and interventions performed: Yes SDOH Interventions    Flowsheet Row Most Recent Value  SDOH Interventions   Financial Strain Interventions Intervention Not Indicated      SDOH Screenings   Alcohol Screen: Low Risk    Last Alcohol Screening Score  (AUDIT): 3  Depression (PHQ2-9): Low Risk    PHQ-2 Score: 0  Financial Resource Strain: High Risk   Difficulty of Paying Living Expenses: Hard  Food Insecurity: No Food Insecurity   Worried About Charity fundraiser in the Last Year: Never true   Ran Out of Food in the Last Year: Never true  Housing: Low Risk    Last Housing Risk Score: 0  Physical Activity: Insufficiently Active   Days of Exercise per Week: 2 days   Minutes of Exercise per Session: 20 min  Social Connections: Moderately Isolated   Frequency of Communication with Friends and Family: Three times a week  Frequency of Social Gatherings with Friends and Family: More than three times a week   Attends Religious Services: Never   Marine scientist or Organizations: No   Attends Music therapist: Never   Marital Status: Married  Stress: No Stress Concern Present   Feeling of Stress : Not at all  Tobacco Use: Medium Risk   Smoking Tobacco Use: Former   Smokeless Tobacco Use: Never   Passive Exposure: Not on file  Transportation Needs: No Transportation Needs   Lack of Transportation (Medical): No   Lack of Transportation (Non-Medical): No    CCM Care Plan  No Known Allergies  Medications Reviewed Today     Reviewed by Germaine Pomfret, [facility] (Pharmacist) on 07/24/21 at 1017  Med List Status: <None>   Medication Order Taking? Sig Documenting Provider Last Dose Status Informant  amLODipine (NORVASC) 10 MG tablet [id number]  Take 1 tablet (10 mg total) by mouth daily. Jerrol Banana., MD  Active   ascorbic acid (VITAMIN C) 500 MG tablet [id number]  Take 500 mg by mouth daily. [provider]  Active   aspirin chewable tablet 81 mg [id number]   Jerrol Banana., MD  Consider Medication Status and Discontinue (Patient Preference)   capsicum (ZOSTRIX) 0.075 % topical cream [id number]  Apply 1 application topically 2 (two) times daily.  Patient not taking: Reported on 07/14/2020    Jerrol Banana., MD  Active   Cholecalciferol 25 MCG (1000 UT) capsule [id number]  Take 1,000 Units by mouth daily.  [provider]  Active   cyanocobalamin 1000 MCG tablet [id number]  Take 1,000 mcg by mouth daily. [provider]  Active   ELIQUIS 5 MG TABS tablet [id number] Yes Take 5 mg by mouth 2 (two) times daily. [provider] Taking Active   metoprolol tartrate (LOPRESSOR) 25 MG tablet [id number] Yes Take 1 tablet by mouth 2 (two) times daily. [provider] Taking Active   rosuvastatin (CRESTOR) 20 MG tablet [id number]  TAKE ONE TABLET EVERY DAY Jerrol Banana., MD  Active   spironolactone (ALDACTONE) 25 MG tablet [id number]  Take 25 mg by mouth daily. [provider]  Active   telmisartan (MICARDIS) 80 MG tablet [id number]  Take 1 tablet by mouth daily. 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/08/2018   Coronary artery disease involving native coronary artery of native heart 10/02/2017   Benign fibroma of prostate 01/18/2015   Hypercholesteremia 01/18/2015   BP (high blood pressure) 01/18/2015   Osteoarthrosis 01/18/2015   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 01/18/2015   Avitaminosis D 01/18/2015    Immunization History  Administered Date(s) Administered   Fluad Quad(high Dose 65+) 02/16/2019, 03/21/2020, 02/22/2021   Influenza, High Dose Seasonal PF 06/12/2017, 05/06/2018   PFIZER(Purple Top)SARS-COV-2 Vaccination 06/11/2019, 07/09/2019   Pneumococcal Polysaccharide-23 02/16/2019   Tdap 03/31/2008   Zoster, Live 11/04/2012    Conditions to be addressed/monitored:  Hypertension, Hyperlipidemia, Atrial Fibrillation, Coronary  Artery Disease, and Osteoarthritis  Care Plan : General Pharmacy (Adult)  Updates made by Germaine Pomfret, RPH since 07/24/2021 12:00 AM     Problem: Hypertension, Hyperlipidemia, Atrial Fibrillation, Coronary  Artery Disease, and Osteoarthritis   Priority: High     Long-Range Goal: Patient-Specific Goal   Start Date: 07/24/2021  Expected End Date: 07/24/2021  This Visit's Progress: On track  Priority: High  Note:   Current Barriers:  Unable to independently afford treatment regimen  Pharmacist Clinical Goal(s):  Patient will maintain control of blood pressure as evidenced by BP less than 140/90  through collaboration with PharmD and provider.   Interventions: 1:1 collaboration with Jerrol Banana., MD regarding development and update of comprehensive plan of care as evidenced by provider attestation and co-signature Inter-disciplinary care team collaboration (see longitudinal plan of care) Comprehensive medication review performed; medication list updated in electronic medical record  Hypertension (BP goal <140/90) -Controlled -Current treatment: Amlodipine 10 mg daily: Appropriate, Effective, Safe, Accessible Metoprolol tartrate 25 mg twice daily: Appropriate, Effective, Safe, Accessible Spironolactone 25 mg daily: Appropriate, Effective, Safe, Accessible  Telmisartan 80 mg daily: Appropriate, Effective, Safe, Accessible  -Medications previously tried: Doxazosin, Toprolol XL  -Current home readings: NA -Denies hypotensive/hypertensive symptoms -Recommended to continue current medication  Hyperlipidemia: (LDL goal < 70) -Controlled -Current treatment: Rosuvastatin 20 mg daily: Appropriate, Effective, Safe, Accessible  -Medications previously tried: NA  -Recommended to continue current medication  Atrial Fibrillation (Goal: prevent stroke and major bleeding) -Controlled -CHADSVASC: 3 -Current treatment: Rate control: Metoprolol tartrate 25 mg twice daily:  Appropriate, Effective, Safe, Accessible Anticoagulation: Eliquis 5 mg twice daily: Appropriate, Effective, Safe, Query Accessible  -Medications previously tried: NA -Patient likely does not qualify for patient assistance due to household income. Patient will ask for samples to help during November-December while he is in the [facility].  -Recommended to continue current medication  Patient Goals/Self-Care Activities Patient will:  - check blood pressure weekly, document, and provide at future appointments  Follow Up Plan: No further follow up required: Patient will follow-up with clinical pharmacy team as needed      Medication Assistance:  Patient does not qualify for patient assistance for Eliquis.  Compliance/Adherence/Medication fill history: Care Gaps: Zoster Vaccines Tetanus/TDAP COVID-19 Vaccine Booster 3 PNA Vaccine  [facility]: Rosuvastatin 20 mg last filled on 06/19/2021 for a 90-Day supply with [facility]  Patient's preferred pharmacy is:  [place], [place] - [address] [place] [address] Phone: [phone] Fax: [phone]  Uses pill box? Yes Pt endorses 100% compliance  We discussed: Current pharmacy is preferred with insurance plan and patient is satisfied with pharmacy services Patient decided to: Continue current medication management strategy  Care Plan and Follow Up Patient Decision:  Patient agrees to Care Plan and Follow-up.  Plan: No further follow up required: Patient will follow-up with clinical pharmacy team as needed  Junius Argyle, PharmD, [place], [place] Pharmacist Practitioner  [facility] [phone]

## 2021-07-24 NOTE — Progress Notes (Signed)
? ? ?  Chronic Care Management ?Pharmacy Assistant  ? ?Name: Pedro Swanson  MRN: [id number] DOB: Apr 28, 1951 ? ?Initial Visit with Junius Argyle, CPP on 07/24/2021 @ 1000 ? ?Initial Visit Questions not asked due to the patient's appointment with the CPP is today. ?  ?Conditions to be addressed/monitored: ?Atrial Fibrillation, CAD, HTN, Osteoarthritis, and Atherosclerosis of abdominal aorta, Benign Fibroma of Prostate, Hypercholesteremia,  ? ?Recent office visits:  ?07/20/2021 Kirke Shaggy, LPN (PCP Clinical Support) for Medicare Annual Wellness- No medication changes, noted, no orders placed, no follow-up noted   ? ?07/20/2021 Miguel Aschoff, MD (PCP Office Visit) for Annual Exam- No medication changes noted, lab orders placed, patient to follow-up in 6 months ? ?Recent consult visits:  ?03/22/2021 Flossie Dibble, MD (Cardiology) I do not have the appropriate security to download this document ? ?Hospital visits:  ?None in previous 6 months ? ?Medications: ?Outpatient Encounter Medications as of 07/24/2021  ?Medication Sig Note  ? amLODipine (NORVASC) 10 MG tablet Take 1 tablet (10 mg total) by mouth daily.   ? ascorbic acid (VITAMIN C) 500 MG tablet Take 500 mg by mouth daily.   ? aspirin EC 81 MG tablet Take 81 mg by mouth. Three times weekly   ? capsicum (ZOSTRIX) 0.075 % topical cream Apply 1 application topically 2 (two) times daily. (Patient not taking: Reported on 07/14/2020)   ? Cholecalciferol 25 MCG (1000 UT) capsule Take 1,000 Units by mouth daily.    ? cyanocobalamin 1000 MCG tablet Take 1,000 mcg by mouth daily.   ? doxazosin (CARDURA XL) 4 MG 24 hr tablet Take 1 tablet (4 mg total) by mouth daily with breakfast. (Patient not taking: Reported on 07/14/2020)   ? ELIQUIS 5 MG TABS tablet Take 5 mg by mouth 2 (two) times daily.   ? gabapentin (NEURONTIN) 300 MG capsule Take 1 capsule (300 mg total) by mouth 3 (three) times daily. (Patient not taking: Reported on 06/02/2020) 06/02/2020: Taken off gabapentin  06/02/20  ? metoprolol succinate (TOPROL-XL) 25 MG 24 hr tablet Take 0.5 mg by mouth daily at 6 (six) AM.   ? metoprolol tartrate (LOPRESSOR) 25 MG tablet Take 1 tablet by mouth 2 (two) times daily.   ? rosuvastatin (CRESTOR) 20 MG tablet TAKE ONE TABLET EVERY DAY   ? spironolactone (ALDACTONE) 25 MG tablet Take 25 mg by mouth daily.   ? telmisartan (MICARDIS) 80 MG tablet Take 1 tablet by mouth daily.   ? ?Facility-Administered Encounter Medications as of 07/24/2021  ?Medication  ? aspirin chewable tablet 81 mg  ? ?Care Gaps: ?Zoster Vaccines ?Tetanus/TDAP ?COVID-19 Vaccine Booster 3 ?PNA Vaccine ? ?Star Rating Drugs: ?Rosuvastatin 20 mg last filled on 06/19/2021 for a 90-Day supply with [facility] ? ?Lynann Bologna, CPA/CMA ?Clinical Pharmacist Assistant ?Phone: [phone]  ? ?

## 2021-07-24 NOTE — Patient Instructions (Signed)
Visit Information ?It was great speaking with you today!  Please let me know if you have any questions about our visit. ? ? Goals Addressed   ? ?  ?  ?  ?  ? This Visit's Progress  ?  Track and Manage My Blood Pressure-Hypertension     ?  Timeframe:  Long-Range Goal ?Priority:  High ?Start Date: 07/24/2021                            ?Expected End Date: 07/25/2022                     ? ?Follow Up within 90 days ?  ?- check blood pressure weekly  ?  ?Why is this important?   ?You won't feel high blood pressure, but it can still hurt your blood vessels.  ?High blood pressure can cause heart or kidney problems. It can also cause a stroke.  ?Making lifestyle changes like losing a little weight or eating less salt will help.  ?Checking your blood pressure at home and at different times of the day can help to control blood pressure.  ?If the doctor prescribes medicine remember to take it the way the doctor ordered.  ?Call the office if you cannot afford the medicine or if there are questions about it.   ?  ?Notes:  ?  ? ?  ? ? ?Patient Care Plan: General Pharmacy (Adult)  ?  ? ?Problem Identified: Hypertension, Hyperlipidemia, Atrial Fibrillation, Coronary Artery Disease, and Osteoarthritis   ?Priority: High  ?  ? ?Long-Range Goal: Patient-Specific Goal   ?Start Date: 07/24/2021  ?Expected End Date: 07/24/2021  ?This Visit's Progress: On track  ?Priority: High  ?Note:   ?Current Barriers:  ?Unable to independently afford treatment regimen ? ?Pharmacist Clinical Goal(s):  ?Patient will maintain control of blood pressure as evidenced by BP less than 140/90  through collaboration with PharmD and provider.  ? ?Interventions: ?1:1 collaboration with Jerrol Banana., MD regarding development and update of comprehensive plan of care as evidenced by provider attestation and co-signature ?Inter-disciplinary care team collaboration (see longitudinal plan of care) ?Comprehensive medication review performed; medication list updated  in electronic medical record ? ?Hypertension (BP goal <140/90) ?-Controlled ?-Current treatment: ?Amlodipine 10 mg daily: Appropriate, Effective, Safe, Accessible ?Metoprolol tartrate 25 mg twice daily: Appropriate, Effective, Safe, Accessible ?Spironolactone 25 mg daily: Appropriate, Effective, Safe, Accessible  ?Telmisartan 80 mg daily: Appropriate, Effective, Safe, Accessible  ?-Medications previously tried: Doxazosin, Toprolol XL  ?-Current home readings: NA ?-Denies hypotensive/hypertensive symptoms ?-Recommended to continue current medication ? ?Hyperlipidemia: (LDL goal < 70) ?-Controlled ?-Current treatment: ?Rosuvastatin 20 mg daily: Appropriate, Effective, Safe, Accessible  ?-Medications previously tried: NA  ?-Recommended to continue current medication ? ?Atrial Fibrillation (Goal: prevent stroke and major bleeding) ?-Controlled ?-CHADSVASC: 3 ?-Current treatment: ?Rate control: Metoprolol tartrate 25 mg twice daily: Appropriate, Effective, Safe, Accessible ?Anticoagulation: Eliquis 5 mg twice daily: Appropriate, Effective, Safe, Query Accessible  ?-Medications previously tried: NA ?-Patient likely does not qualify for patient assistance due to household income. Patient will ask for samples to help during November-December while he is in the [facility].  ?-Recommended to continue current medication ? ?Patient Goals/Self-Care Activities ?Patient will:  ?- check blood pressure weekly, document, and provide at future appointments ? ?Follow Up Plan: No further follow up required: Patient will follow-up with clinical pharmacy team as needed ?  ? ?Pedro Swanson was given information about  Chronic Care Management services today including:  ?CCM service includes personalized support from designated clinical staff supervised by his physician, including individualized plan of care and coordination with other care providers ?24/7 contact phone numbers for assistance for urgent and routine care needs. ?Standard  insurance, coinsurance, copays and deductibles apply for chronic care management only during months in which we provide at least 20 minutes of these services. Most insurances cover these services at 100%, however patients may be responsible for any copay, coinsurance and/or deductible if applicable. This service may help you avoid the need for more expensive face-to-face services. ?Only one practitioner may furnish and bill the service in a calendar month. ?The patient may stop CCM services at any time (effective at the end of the month) by phone call to the office staff. ? ?Patient agreed to services and verbal consent obtained.  ? ?Print copy of patient instructions, educational materials, and care plan provided in person.  ? ?Pedro Swanson, PharmD, BCACP, CPP  ?Clinical Pharmacist Practitioner  ?[place] ?[phone]  ?

## 2021-07-25 ENCOUNTER — Other Ambulatory Visit: Payer: Self-pay

## 2021-07-25 DIAGNOSIS — Z87891 Personal history of nicotine dependence: Secondary | ICD-10-CM

## 2021-07-28 ENCOUNTER — Other Ambulatory Visit: Payer: Self-pay | Admitting: *Deleted

## 2021-08-11 DIAGNOSIS — I4891 Unspecified atrial fibrillation: Secondary | ICD-10-CM | POA: Diagnosis not present

## 2021-08-11 DIAGNOSIS — I10 Essential (primary) hypertension: Secondary | ICD-10-CM

## 2021-08-11 DIAGNOSIS — E785 Hyperlipidemia, unspecified: Secondary | ICD-10-CM

## 2021-08-15 ENCOUNTER — Ambulatory Visit
Admission: RE | Admit: 2021-08-15 | Discharge: 2021-08-15 | Disposition: A | Discharge status: Home / Self Care | Payer: Medicare HMO | Source: Ambulatory Visit | Attending: Acute Care | Admitting: Acute Care

## 2021-08-15 ENCOUNTER — Other Ambulatory Visit: Payer: Self-pay

## 2021-08-15 DIAGNOSIS — Z87891 Personal history of nicotine dependence: Secondary | ICD-10-CM | POA: Diagnosis present

## 2021-08-16 ENCOUNTER — Other Ambulatory Visit: Payer: Self-pay

## 2021-08-16 DIAGNOSIS — Z122 Encounter for screening for malignant neoplasm of respiratory organs: Secondary | ICD-10-CM

## 2021-08-16 DIAGNOSIS — Z87891 Personal history of nicotine dependence: Secondary | ICD-10-CM

## 2022-01-17 ENCOUNTER — Telehealth: Payer: Self-pay | Admitting: Family Medicine

## 2022-01-17 NOTE — Telephone Encounter (Signed)
Patient is [place] the [place] and is asking for samples of Eliquis 5 mg. Please.

## 2022-01-17 NOTE — Telephone Encounter (Signed)
Please advise 

## 2022-01-23 ENCOUNTER — Ambulatory Visit (INDEPENDENT_AMBULATORY_CARE_PROVIDER_SITE_OTHER): Payer: Medicare HMO | Admitting: Family Medicine

## 2022-01-23 ENCOUNTER — Encounter: Payer: Self-pay | Admitting: Family Medicine

## 2022-01-23 VITALS — BP 113/68 | HR 48 | Resp 16 | Wt 177.0 lb

## 2022-01-23 DIAGNOSIS — H9113 Presbycusis, bilateral: Secondary | ICD-10-CM

## 2022-01-23 DIAGNOSIS — I480 Paroxysmal atrial fibrillation: Secondary | ICD-10-CM | POA: Diagnosis not present

## 2022-01-23 DIAGNOSIS — I2510 Atherosclerotic heart disease of native coronary artery without angina pectoris: Secondary | ICD-10-CM

## 2022-01-23 DIAGNOSIS — I10 Essential (primary) hypertension: Secondary | ICD-10-CM | POA: Diagnosis not present

## 2022-01-23 DIAGNOSIS — E7800 Pure hypercholesterolemia, unspecified: Secondary | ICD-10-CM

## 2022-01-23 DIAGNOSIS — G20 Parkinson's disease: Secondary | ICD-10-CM

## 2022-01-23 NOTE — Progress Notes (Signed)
Established patient visit  I,April Miller,acting as a scribe for Wilhemena Durie, MD.,have documented all relevant documentation on the behalf of Wilhemena Durie, MD,as directed by  Wilhemena Durie, MD while in the presence of Wilhemena Durie, MD.   Patient: Pedro Swanson   DOB: [birth year]   71 y.o. Male  MRN: [id number] Visit Date: 01/23/2022  Today's healthcare provider: Wilhemena Durie, MD   Chief Complaint  Patient presents with   Follow-up   Hypertension   Subjective    HPI  She comes in today for follow-up.  Dr. Manuella Ghazi from neurology told the patient he has parkinsonism. Overall feeling well and has no complaints.  His balance is not as good as he is to be.  Hypertension, follow-up  BP Readings from Last 3 Encounters:  01/23/22 113/68  07/20/21 134/78  01/18/21 136/67   Wt Readings from Last 3 Encounters:  01/23/22 177 lb (80.3 kg)  08/15/21 175 lb (79.4 kg)  07/20/21 175 lb 12.8 oz (79.7 kg)     He was last seen for hypertension 6 months ago.  Management since that visit includes; on metoprolol 25 mg and telmisartan 80 mg.  Outside blood pressures are normal.  Pertinent labs Lab Results  Component Value Date   CHOL 144 07/20/2021   HDL 64 07/20/2021   LDLCALC 65 07/20/2021   TRIG 79 07/20/2021   CHOLHDL 2.3 07/20/2021   Lab Results  Component Value Date   NA 142 07/20/2021   K 5.2 07/20/2021   CREATININE 1.04 07/20/2021   EGFR 76 07/20/2021   GLUCOSE 104 (H) 07/20/2021   TSH 1.440 07/20/2021     The 10-year ASCVD risk score (Arnett DK, et al., 2019) is: 16.7%  ---------------------------------------------------------------------------------------------------   Medications: Outpatient Medications Prior to Visit  Medication Sig   ascorbic acid (VITAMIN C) 500 MG tablet Take 500 mg by mouth daily.   Cholecalciferol 25 MCG (1000 UT) capsule Take 1,000 Units by mouth daily.    cyanocobalamin 1000 MCG tablet Take 1,000 mcg by  mouth daily.   ELIQUIS 5 MG TABS tablet Take 5 mg by mouth 2 (two) times daily.   flecainide (TAMBOCOR) 50 MG tablet Take 50 mg by mouth 2 (two) times daily.   metoprolol tartrate (LOPRESSOR) 25 MG tablet Take 1 tablet by mouth 2 (two) times daily.   rosuvastatin (CRESTOR) 20 MG tablet TAKE ONE TABLET EVERY DAY   telmisartan (MICARDIS) 80 MG tablet Take 1 tablet by mouth daily.   Facility-Administered Medications Prior to Visit  Medication Dose Route Frequency Provider   aspirin chewable tablet 81 mg  81 mg Oral Once Jerrol Banana., MD    Review of Systems  Constitutional:  Negative for appetite change, chills and fever.  Respiratory:  Negative for chest tightness, shortness of breath and wheezing.   Cardiovascular:  Negative for chest pain and palpitations.  Gastrointestinal:  Negative for abdominal pain, nausea and vomiting.    Last thyroid functions Lab Results  Component Value Date   TSH 1.440 07/20/2021       Objective    BP 113/68 (BP Location: Right Arm, Patient Position: Sitting, Cuff Size: Normal)   Pulse (!) 48   Resp 16   Wt 177 lb (80.3 kg)   SpO2 96%   BMI 25.40 kg/m  BP Readings from Last 3 Encounters:  01/23/22 113/68  07/20/21 134/78  01/18/21 136/67   Wt Readings from Last 3 Encounters:  01/23/22  177 lb (80.3 kg)  08/15/21 175 lb (79.4 kg)  07/20/21 175 lb 12.8 oz (79.7 kg)      Physical Exam Vitals reviewed.  Constitutional:      Appearance: He is well-developed and normal weight.  HENT:     Head: Normocephalic and atraumatic.     Right Ear: Tympanic membrane and external ear normal.     Left Ear: Tympanic membrane and external ear normal.     Nose: Nose normal.     Mouth/Throat:     Pharynx: Oropharynx is clear.  Eyes:     General: No scleral icterus.    Conjunctiva/sclera: Conjunctivae normal.  Neck:     Thyroid: No thyromegaly.  Cardiovascular:     Rate and Rhythm: Normal rate and regular rhythm.     Heart sounds: Normal  heart sounds.  Pulmonary:     Effort: Pulmonary effort is normal.     Breath sounds: Normal breath sounds.  Abdominal:     Palpations: Abdomen is soft.  Genitourinary:    Penis: Normal.      Testes: Normal.  Musculoskeletal:        General: Normal range of motion.     Comments: Left clavicle obviously has had had AC severe sprain in past.  Skin:    General: Skin is warm and dry.     Comments: Chronic  Cyst right upper back.  Neurological:     Mental Status: He is alert and oriented to person, place, and time. Mental status is at baseline.  Psychiatric:        Mood and Affect: Mood normal.        Behavior: Behavior normal.        Thought Content: Thought content normal.        Judgment: Judgment normal.       No results found for any visits on 01/23/22.  Assessment & Plan     1. Paroxysmal atrial fibrillation (HCC) On Eliquis  2. Coronary artery disease involving native coronary artery of native heart without angina pectoris Risk factors treated  3. Primary hypertension Controlled on metoprolol and telmisartan  4. Presbycusis of both ears   5. 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 Physical therapy referral for his walking.  His gait is normal in the office today 6. Hypercholesteremia On rosuvastatin   No follow-ups on file.      I, Wilhemena Durie, MD, have reviewed all documentation for this visit. The documentation on 01/27/22 for the exam, diagnosis, procedures, and orders are all accurate and complete.    Moritz Lever Cranford Mon, MD  [facility] [phone] (phone) [phone] (fax)  [place]

## 2022-01-24 DIAGNOSIS — R001 Bradycardia, unspecified: Secondary | ICD-10-CM | POA: Insufficient documentation

## 2022-03-20 ENCOUNTER — Telehealth: Payer: Self-pay | Admitting: Family Medicine

## 2022-03-20 DIAGNOSIS — E7800 Pure hypercholesterolemia, unspecified: Secondary | ICD-10-CM

## 2022-03-20 MED ORDER — ROSUVASTATIN CALCIUM 20 MG PO TABS: 20.0000 mg | ORAL_TABLET | Freq: Every day | ORAL | 1 refills | Status: DC

## 2022-03-20 NOTE — Telephone Encounter (Signed)
[facility] faxed refill request for the following medications:   rosuvastatin (CRESTOR) 20 MG tablet    Please advise.

## 2022-03-28 ENCOUNTER — Ambulatory Visit: Payer: Medicare HMO | Admitting: Family Medicine

## 2022-04-04 ENCOUNTER — Ambulatory Visit (INDEPENDENT_AMBULATORY_CARE_PROVIDER_SITE_OTHER): Payer: Medicare HMO | Admitting: Family Medicine

## 2022-04-04 ENCOUNTER — Ambulatory Visit: Payer: Medicare HMO | Attending: Neurology | Admitting: Occupational Therapy

## 2022-04-04 ENCOUNTER — Encounter: Payer: Self-pay | Admitting: Occupational Therapy

## 2022-04-04 DIAGNOSIS — R2681 Unsteadiness on feet: Secondary | ICD-10-CM

## 2022-04-04 DIAGNOSIS — R293 Abnormal posture: Secondary | ICD-10-CM | POA: Diagnosis present

## 2022-04-04 DIAGNOSIS — Z23 Encounter for immunization: Secondary | ICD-10-CM | POA: Diagnosis not present

## 2022-04-04 DIAGNOSIS — M6281 Muscle weakness (generalized): Secondary | ICD-10-CM | POA: Diagnosis not present

## 2022-04-04 DIAGNOSIS — R262 Difficulty in walking, not elsewhere classified: Secondary | ICD-10-CM | POA: Diagnosis present

## 2022-04-04 DIAGNOSIS — R278 Other lack of coordination: Secondary | ICD-10-CM

## 2022-04-04 NOTE — Progress Notes (Signed)
Patient is here for flu shot.  Influenza vaccine was administered today  Patient tolerated well    No MD intervention was required for today's RN visit  Eulis Foster, MD  [facility]

## 2022-04-09 ENCOUNTER — Ambulatory Visit: Payer: Medicare HMO | Admitting: Occupational Therapy

## 2022-04-09 DIAGNOSIS — R262 Difficulty in walking, not elsewhere classified: Secondary | ICD-10-CM

## 2022-04-09 DIAGNOSIS — R2681 Unsteadiness on feet: Secondary | ICD-10-CM

## 2022-04-09 DIAGNOSIS — R278 Other lack of coordination: Secondary | ICD-10-CM

## 2022-04-09 DIAGNOSIS — M6281 Muscle weakness (generalized): Secondary | ICD-10-CM | POA: Diagnosis not present

## 2022-04-09 DIAGNOSIS — R293 Abnormal posture: Secondary | ICD-10-CM

## 2022-04-10 ENCOUNTER — Ambulatory Visit: Payer: Medicare HMO | Admitting: Occupational Therapy

## 2022-04-10 DIAGNOSIS — M6281 Muscle weakness (generalized): Secondary | ICD-10-CM | POA: Diagnosis not present

## 2022-04-10 DIAGNOSIS — R278 Other lack of coordination: Secondary | ICD-10-CM

## 2022-04-10 DIAGNOSIS — R293 Abnormal posture: Secondary | ICD-10-CM

## 2022-04-10 DIAGNOSIS — R262 Difficulty in walking, not elsewhere classified: Secondary | ICD-10-CM

## 2022-04-10 DIAGNOSIS — R2681 Unsteadiness on feet: Secondary | ICD-10-CM

## 2022-04-11 ENCOUNTER — Ambulatory Visit: Payer: Medicare HMO | Admitting: Occupational Therapy

## 2022-04-11 ENCOUNTER — Encounter: Payer: Self-pay | Admitting: Family Medicine

## 2022-04-11 DIAGNOSIS — R278 Other lack of coordination: Secondary | ICD-10-CM

## 2022-04-11 DIAGNOSIS — M6281 Muscle weakness (generalized): Secondary | ICD-10-CM

## 2022-04-11 DIAGNOSIS — R293 Abnormal posture: Secondary | ICD-10-CM

## 2022-04-11 DIAGNOSIS — R2681 Unsteadiness on feet: Secondary | ICD-10-CM

## 2022-04-11 DIAGNOSIS — R262 Difficulty in walking, not elsewhere classified: Secondary | ICD-10-CM

## 2022-04-12 ENCOUNTER — Ambulatory Visit: Payer: Medicare HMO | Admitting: Occupational Therapy

## 2022-04-12 ENCOUNTER — Encounter: Payer: Self-pay | Admitting: Occupational Therapy

## 2022-04-12 NOTE — Therapy (Signed)
OUTPATIENT OCCUPATIONAL THERAPY NEURO TREATMENT  Patient Name: Pedro Swanson MRN: [id number] DOB:1950-12-01, 71 y.o., male Today's Date: 04/10/2022  PCP: Eulis Foster REFERRING PROVIDER: Jennings Books  END OF SESSION:  OT End of Session - 04/12/22 1604     Visit Number 3    Number of Visits 17    Date for OT Re-Evaluation 05/16/22    OT Start Time 1102    OT Stop Time 1204    OT Time Calculation (min) 62 min    Activity Tolerance Patient tolerated treatment well    Behavior During Therapy [facility] for tasks assessed/performed             Past Medical History:  Diagnosis Date   Cataract    COVID    Hyperlipidemia    Hypertension    Palpitations    Shingles    Past Surgical History:  Procedure Laterality Date   CATARACT EXTRACTION     right eye   Patient Active Problem List   Diagnosis Date Noted   OSA (obstructive sleep apnea) 12/14/2020   Paroxysmal atrial fibrillation ([place]) 07/11/2020   Pain in joint of left shoulder 01/11/2020   Atherosclerosis of abdominal aorta ([place]) 05/04/2019   Arteriosclerotic cardiovascular disease (ASCVD) 07/08/2018   Coronary artery disease involving native coronary artery of native heart 10/02/2017   Benign fibroma of prostate 01/18/2015   Hypercholesteremia 01/18/2015   BP (high blood pressure) 01/18/2015   Osteoarthrosis 01/18/2015   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  Possible neuropathy with paresthesia in bilateral feet with reduced intraepidermal nerve fiber density on distal right thigh found on syn-one test 01/19/2022. History of Obstructive sleep apnea on CPAP, REM behavior disorder, vitamin deficiency, Shingles, A fib.   PRECAUTIONS: Fall  WEIGHT BEARING RESTRICTIONS: No  PAIN:  Are you having pain? No  FALLS: Has patient fallen in last 6 months? Yes. Number of falls 1  LIVING ENVIRONMENT: Lives with: lives with their family and lives with their spouse Lives in: House/apartment Has following equipment at home: None  PLOF: Independent with basic ADLs  PATIENT GOALS: Pt reports he would like to improve his balance and reduce his risk of falls.     HAND DOMINANCE: Right  ADLs: Overall ADLs: modified independent Transfers/ambulation related to ADLs: modified independent, no assistive device at this time Eating: modified independent Grooming: modified independent UB Dressing: modified independent, increased time for buttons, snaps and zippers LB Dressing: modified independent but requires increased time to complete tasks compared to a year ago Toileting: modified independent Bathing: modified independent Tub Shower transfers: modified independent Equipment: none    IADLs: Shopping: able to complete small purchases,  wife does most of the shopping Light housekeeping: able to help around the home with light tasks but wife does most of housekeeping chores. Meal Prep: modified independent Yardwork:  Pt reports requiring increased  assistance for yardwork over time, they now moved to a townhome so he does not have to perform these tasks.   Community mobility: modified independent but reports slower with ambulation in recent months. He is still able to drive.  Medication management: Pt requires reminders for medication  Financial management: modified independent Handwriting: 75% legible Leisure tasks include:  Music, painting and sailing, pt reports he is not able to participate in many of these tasks as he did in the past, especially with sailing and decreased balance.  MOBILITY STATUS: Hx of falls  POSTURE COMMENTS:  rounded shoulders, forwards flexed posture Sitting balance: Sits without UE support up to 30 sec  ACTIVITY TOLERANCE: Activity tolerance: Pt reports increased time to perform tasks, will take rest breaks as needed.  FUNCTIONAL OUTCOME MEASURES: FOTO: 72.88  UPPER EXTREMITY ROM:    Active ROM Right eval Left eval  Shoulder flexion 130 90  Shoulder abduction 128 88  Shoulder adduction    Shoulder extension    Shoulder internal rotation    Shoulder external rotation    Elbow flexion 140 140  Elbow extension 0 0  Wrist flexion    Wrist extension    Wrist ulnar deviation    Wrist radial deviation    Wrist pronation    Wrist supination    (Blank rows = not tested)  UPPER EXTREMITY MMT:     MMT Right eval Left eval  Shoulder flexion 4/5 3-/5  Shoulder abduction 4/5 3-/5  Shoulder adduction    Shoulder extension    Shoulder internal rotation    Shoulder external rotation    Middle trapezius    Lower trapezius    Elbow flexion 4/5 4/5  Elbow extension    Wrist flexion 4/5 4/5  Wrist extension 4/5 4/5  Wrist ulnar deviation    Wrist radial deviation    Wrist pronation    Wrist supination    (Blank rows = not tested)  HAND FUNCTION: Grip strength: Right: 95 lbs; Left: 94 lbs, Lateral pinch: Right: 25 lbs, Left: 17 lbs, and 3 point pinch: Right: 17 lbs, Left: 20  lbs  COORDINATION: 9 Hole Peg test: Right: 28 sec; Left: 34 sec  SENSATION: WFL  EDEMA: none  COGNITION: Overall cognitive status: Within functional limits for tasks assessed, Pt reports difficulty at times recalling names.  Appears to demonstrate slower processing of new information and requires increased time for new learning.  VISION: Subjective report: Reports he had cararacts but were removed. Baseline vision: Wears glasses for reading only Visual history: cataracts  VISION ASSESSMENT: WFL  Patient has difficulty with following activities due to following visual impairments: needs glasses when viewing printed text  PERCEPTION: WFL   ADDITIONAL TESTING:   6 minute walk test 790 feet.  Observations:  Pt with decreased reciprocal arm swing, decreased step length-tends to have short, shuffled gait, can be easily distracted during task.  Forward flexed posture throughout test.   5 times sit to stand 14 secs BERG balance test: 43/56    TODAY'S TREATMENT:  DATE: 04/09/2022  Neuromuscular Reeducation:   Patient seen for initial instruction of LSVT BIG exercises: LSVT Daily Session Maximal Daily Exercises: Sustained movements are designed to rescale the amplitude of movement output for generalization to daily functional activities. Performed as follows for 1 set of 10 repetitions each: Multi directional sustained movements- 1) Floor to ceiling, 2) Side to side. Multi directional Repetitive movements performed in standing and are designed to provide retraining effort needed for sustained muscle activation in tasks Performed as follows: 3) Step and reach forward, 4) Step and Reach Backwards, 5) Step and reach sideways, 6) Rock and reach forward/backward, 7) Rock and reach sideways. Sit to stand from mat table on lowest setting with cues for weight shift, technique  and min guard for 10 reps for 1 set.    All exercises performed this date continued to require verbal and tactile cues for proper form and technique.  Min assist to min guard for balance with exercises in standing.  Pt continues to require increased time for processing of information and is able to verbalize the need for time. Pt demonstrates frontal disinhibition, interrupting conversation during session and requires some redirection and focus to task.  Exercises were performed this date in the standard version.  Pt able to complete exercises this date in less time.  Instructed on posture exercises in standing with back against the wall, manual cues for scapular retraction, head position and stretch to pectoralis muscles with place and hold.  Bilateral UE shoulder flexion with use of medium playground ball in standing with focus on posture.  Functional mobility for 250 feet with cues for posture, reciprocal arm movements and increasing amplitude of steps.    PATIENT EDUCATION: Education details: Pt and wife educated on LSVT BIG program, frequency, POC, pt's limitations Person educated: Patient and Spouse Education method: Explanation Education comprehension: verbalized understanding  HOME EXERCISE PROGRAM: Will initiate HEP next session when initiating treatment and implementation of LSVT BIG program    GOALS: Goals reviewed with patient? Yes  SHORT TERM GOALS: Target date: 04/20/2022  Pt to be able to perform buttons with improved speed and dexterity for dress shirt in less than 1 min. Baseline: increased time, up to 2 min to complete Goal status: INITIAL    LONG TERM GOALS: Target date: 05/16/2022  Patient will improve gait speed and endurance to be able to ambulate  feet in 6 minutes to negotiate around the home and community safely in 4 weeks.  Baseline: Eval-6 min walk test Goal status: INITIAL  2.  Patient will complete HEP for maximal daily exercises with modified  independence in 4 weeks.  Baseline: no current home program related to Parkinsonism symptoms. Goal status: INITIAL  3.  Patient will transfer from to sit to stand without the use of arms safely and independently from a variety of chairs/surfaces in 4 weeks.  Baseline: difficulty with sit to stand from lower surface heights and occasionally has to use arms to assist. Goal status: INITIAL  4.  Pt will score 74 or greater on FOTO to demonstrate a clinically relevant change to impact independence in ADL and IADL tasks at home and in the community.   Baseline: At eval FOTO: 72.88 Goal status: INITIAL  5.  Pt will improve BERG balance score by 5 points to demonstrate improved balance during functional mobility and during ADL and IADL to reduce fall risk.   Baseline: At eval score: 43 Goal status: INITIAL    ASSESSMENT:  CLINICAL IMPRESSION: Pt becoming  more familiar with exercises over the week but continues to require cues for proper form and technique as well as improving amplitude of movements.  Pt had a prior injury to his left shoulder and remains limited in ROM and strength.  He is able to incorporate arm into exercises but often requires cues to initiate use.  Pt requires frequent redirection to task and demonstrates some frontal disinhibition.  He responds to cues but often verbalizes he thought he was performing exercise correctly.  Added posture exercises this date and will continue to target posture during all activities.  Will continue to work towards improving performance of daily exercises with added complexity.  Will add additional balance challenges in the next week.      PERFORMANCE DEFICITS: in functional skills including IADLs, coordination, dexterity, proprioception, ROM, strength, flexibility, Fine motor control, Gross motor control, mobility, balance, endurance, and UE functional use, cognitive skills including attention and memory, and psychosocial skills including  environmental adaptation, habits, and routines and behaviors.   IMPAIRMENTS: are limiting patient from ADLs, IADLs, leisure, and social participation.   CO-MORBIDITIES: may have co-morbidities  that affects occupational performance. Patient will benefit from skilled OT to address above impairments and improve overall function.  MODIFICATION OR ASSISTANCE TO COMPLETE EVALUATION: No modification of tasks or assist necessary to complete an evaluation.  OT OCCUPATIONAL PROFILE AND HISTORY: Detailed assessment: Review of records and additional review of physical, cognitive, psychosocial history related to current functional performance.  CLINICAL DECISION MAKING: Moderate - several treatment options, min-mod task modification necessary  REHAB POTENTIAL: Excellent  EVALUATION COMPLEXITY: Low  PLAN:  OT FREQUENCY: 4x/week  OT DURATION: 4 weeks  PLANNED INTERVENTIONS: self care/ADL training, therapeutic exercise, therapeutic activity, neuromuscular re-education, manual therapy, passive range of motion, gait training, balance training, functional mobility training, moist heat, and patient/family education  RECOMMENDED OTHER SERVICES: none at this time  CONSULTED AND AGREED WITH PLAN OF CARE: Patient and family member/caregiver  PLAN FOR NEXT SESSION: Continue with instruction of LSVT BIG maximal daily exercises   Dariana Garbett T Philicia Heyne, OTR/L, CLT  Kwesi Sangha, OT 04/12/2022, 4:06 PM

## 2022-04-12 NOTE — Therapy (Signed)
OUTPATIENT OCCUPATIONAL THERAPY NEURO TREATMENT  Patient Name: Pedro Swanson MRN: [id number] DOB:1950/08/26, 71 y.o., male Today's Date: 04/09/2022  PCP: Eulis Foster REFERRING PROVIDER: Jennings Books  END OF SESSION:  OT End of Session - 04/12/22 1454     Visit Number 2    Number of Visits 17    Date for OT Re-Evaluation 05/16/22    OT Start Time 1259    OT Stop Time 1400    OT Time Calculation (min) 61 min    Activity Tolerance Patient tolerated treatment well    Behavior During Therapy [facility] for tasks assessed/performed             Past Medical History:  Diagnosis Date   Cataract    COVID    Hyperlipidemia    Hypertension    Palpitations    Shingles    Past Surgical History:  Procedure Laterality Date   CATARACT EXTRACTION     right eye   Patient Active Problem List   Diagnosis Date Noted   OSA (obstructive sleep apnea) 12/14/2020   Paroxysmal atrial fibrillation ([place]) 07/11/2020   Pain in joint of left shoulder 01/11/2020   Atherosclerosis of abdominal aorta ([place]) 05/04/2019   Arteriosclerotic cardiovascular disease (ASCVD) 07/08/2018   Coronary artery disease involving native coronary artery of native heart 10/02/2017   Benign fibroma of prostate 01/18/2015   Hypercholesteremia 01/18/2015   BP (high blood pressure) 01/18/2015   Osteoarthrosis 01/18/2015   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  Reports his wife will help him with exercises at home if needed.    PERTINENT HISTORY: Pt  seen by neurology this year for evalution for Parkinson like symptoms.  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  Possible neuropathy with paresthesia in bilateral feet with reduced intraepidermal nerve fiber density on distal right thigh found on syn-one test 01/19/2022. History of Obstructive sleep apnea on CPAP, REM behavior disorder, vitamin deficiency, Shingles, A fib.   PRECAUTIONS: Fall  WEIGHT BEARING RESTRICTIONS: No  PAIN:  Are you having pain? No  FALLS: Has patient fallen in last 6 months? Yes. Number of falls 1  LIVING ENVIRONMENT: Lives with: lives with their family and lives with their spouse Lives in: House/apartment Has following equipment at home: None  PLOF: Independent with basic ADLs  PATIENT GOALS: Pt reports he would like to improve his balance and reduce his risk of falls.     HAND DOMINANCE: Right  ADLs: Overall ADLs: modified independent Transfers/ambulation related to ADLs: modified independent, no assistive device at this time Eating: modified independent Grooming: modified independent UB Dressing: modified independent, increased time for buttons, snaps and zippers LB Dressing: modified independent but requires increased  time to complete tasks compared to a year ago Toileting: modified independent Bathing: modified independent Tub Shower transfers: modified independent Equipment: none    IADLs: Shopping: able to complete small purchases, wife does most of the shopping Light housekeeping: able  to help around the home with light tasks but wife does most of housekeeping chores. Meal Prep: modified independent Yardwork:  Pt reports requiring increased assistance for yardwork over time, they now moved to a townhome so he does not have to perform these tasks.   Community mobility: modified independent but reports slower with ambulation in recent months. He is still able to drive.  Medication management: Pt requires reminders for medication  Financial management: modified independent Handwriting: 75% legible Leisure tasks include:  Music, painting and sailing, pt reports he is not able to participate in many of these tasks as he did in the past, especially with sailing and decreased balance.  MOBILITY STATUS: Hx of falls  POSTURE COMMENTS:  rounded shoulders, forwards flexed posture Sitting balance: Sits without UE support up to 30 sec  ACTIVITY TOLERANCE: Activity tolerance: Pt reports increased time to perform tasks, will take rest breaks as needed.  FUNCTIONAL OUTCOME MEASURES: FOTO: 72.88  UPPER EXTREMITY ROM:    Active ROM Right eval Left eval  Shoulder flexion 130 90  Shoulder abduction 128 88  Shoulder adduction    Shoulder extension    Shoulder internal rotation    Shoulder external rotation    Elbow flexion 140 140  Elbow extension 0 0  Wrist flexion    Wrist extension    Wrist ulnar deviation    Wrist radial deviation    Wrist pronation    Wrist supination    (Blank rows = not tested)  UPPER EXTREMITY MMT:     MMT Right eval Left eval  Shoulder flexion 4/5 3-/5  Shoulder abduction 4/5 3-/5  Shoulder adduction    Shoulder extension    Shoulder internal rotation    Shoulder external rotation    Middle trapezius    Lower trapezius    Elbow flexion 4/5 4/5  Elbow extension    Wrist flexion 4/5 4/5  Wrist extension 4/5 4/5  Wrist ulnar deviation    Wrist radial deviation    Wrist pronation    Wrist supination    (Blank rows = not  tested)  HAND FUNCTION: Grip strength: Right: 95 lbs; Left: 94 lbs, Lateral pinch: Right: 25 lbs, Left: 17 lbs, and 3 point pinch: Right: 17 lbs, Left: 20 lbs  COORDINATION: 9 Hole Peg test: Right: 28 sec; Left: 34 sec  SENSATION: WFL  EDEMA: none  COGNITION: Overall cognitive status: Within functional limits for tasks assessed, Pt reports difficulty at times recalling names.  Appears to demonstrate slower processing of new information and requires increased time for new learning.  VISION: Subjective report: Reports he had cararacts but were removed. Baseline vision: Wears glasses for reading only Visual history: cataracts  VISION ASSESSMENT: WFL  Patient has difficulty with following activities due to following visual impairments: needs glasses when viewing printed text  PERCEPTION: WFL   ADDITIONAL TESTING:   6 minute walk test 790 feet.  Observations:  Pt with decreased reciprocal arm swing, decreased step length-tends to have short, shuffled gait, can be easily distracted during task.  Forward flexed posture throughout test.   5 times sit to stand 14 secs BERG balance test: 43/56    TODAY'S TREATMENT:  DATE: 04/09/2022  Neuromuscular Reeducation:   Patient seen for initial instruction of LSVT BIG exercises: LSVT Daily Session Maximal Daily Exercises: Sustained movements are designed to rescale the amplitude of movement output for generalization to daily functional activities. Performed as follows for 1 set of 10 repetitions each: Multi directional sustained movements- 1) Floor to ceiling, 2) Side to side. Multi directional Repetitive movements performed in standing and are designed to provide retraining effort needed for sustained muscle activation in tasks Performed as follows: 3) Step and reach forward, 4) Step and Reach Backwards, 5) Step and reach  sideways, 6) Rock and reach forward/backward, 7) Rock and reach sideways. Sit to stand from mat table on lowest setting with cues for weight shift, technique and CGA for 10 reps for 1 set.    All exercises performed this date required therapist demonstration, verbal and tactile cues for proper form and technique.  Min assist for balance with exercises in standing.  Pt required increased time for processing of information and often requested to pause and needed to verbalize and visualize exercises prior to attempts to perform.  Exercises were performed this date in the standard version however given patient's balance deficits, therapist recommended he perform in an adapted version with use of a chair at home especially if he is performing without assistance from his wife.    Issued written/pictorial handout for HEP for maximal daily exercises for review at home and will resume next date.     PATIENT EDUCATION: Education details: Pt and wife educated on LSVT BIG program, frequency, POC, pt's limitations Person educated: Patient and Spouse Education method: Explanation Education comprehension: verbalized understanding  HOME EXERCISE PROGRAM: Will initiate HEP next session when initiating treatment and implementation of LSVT BIG program    GOALS: Goals reviewed with patient? Yes  SHORT TERM GOALS: Target date: 04/20/2022  Pt to be able to perform buttons with improved speed and dexterity for dress shirt in less than 1 min. Baseline: increased time, up to 2 min to complete Goal status: INITIAL    LONG TERM GOALS: Target date: 05/16/2022  Patient will improve gait speed and endurance to be able to ambulate  feet in 6 minutes to negotiate around the home and community safely in 4 weeks.  Baseline: Eval-6 min walk test Goal status: INITIAL  2.  Patient will complete HEP for maximal daily exercises with modified independence in 4 weeks.  Baseline: no current home program related to  Parkinsonism symptoms. Goal status: INITIAL  3.  Patient will transfer from to sit to stand without the use of arms safely and independently from a variety of chairs/surfaces in 4 weeks.  Baseline: difficulty with sit to stand from lower surface heights and occasionally has to use arms to assist. Goal status: INITIAL  4.  Pt will score 74 or greater on FOTO to demonstrate a clinically relevant change to impact independence in ADL and IADL tasks at home and in the community.   Baseline: At eval FOTO: 72.88 Goal status: INITIAL  5.  Pt will improve BERG balance score by 5 points to demonstrate improved balance during functional mobility and during ADL and IADL to reduce fall risk.   Baseline: At eval score: 43 Goal status: INITIAL    ASSESSMENT:  CLINICAL IMPRESSION: Pt seen for initial instruction on maximal daily exercises.  Pt requires increased processing time when exercises are presented with new learning. Pt will require additional instruction of exercises to become proficient in use.  Wife did not  attend session however, it would be beneficial for her to attend a few of his sessions so she can also learn exercises and assist patient at home as needed.  Pt to look over exercises tonight and we will resume instruction next session.  He will likely benefit from repetition of instruction as well as intensity to help drive neuroplasticity of desire movement patterns.  Continue to work towards goals in plan of care to improve balance, strength, coordination and safety with functional mobility at home and in the community.      PERFORMANCE DEFICITS: in functional skills including IADLs, coordination, dexterity, proprioception, ROM, strength, flexibility, Fine motor control, Gross motor control, mobility, balance, endurance, and UE functional use, cognitive skills including attention and memory, and psychosocial skills including environmental adaptation, habits, and routines and behaviors.    IMPAIRMENTS: are limiting patient from ADLs, IADLs, leisure, and social participation.   CO-MORBIDITIES: may have co-morbidities  that affects occupational performance. Patient will benefit from skilled OT to address above impairments and improve overall function.  MODIFICATION OR ASSISTANCE TO COMPLETE EVALUATION: No modification of tasks or assist necessary to complete an evaluation.  OT OCCUPATIONAL PROFILE AND HISTORY: Detailed assessment: Review of records and additional review of physical, cognitive, psychosocial history related to current functional performance.  CLINICAL DECISION MAKING: Moderate - several treatment options, min-mod task modification necessary  REHAB POTENTIAL: Excellent  EVALUATION COMPLEXITY: Low  PLAN:  OT FREQUENCY: 4x/week  OT DURATION: 4 weeks  PLANNED INTERVENTIONS: self care/ADL training, therapeutic exercise, therapeutic activity, neuromuscular re-education, manual therapy, passive range of motion, gait training, balance training, functional mobility training, moist heat, and patient/family education  RECOMMENDED OTHER SERVICES: none at this time  CONSULTED AND AGREED WITH PLAN OF CARE: Patient and family member/caregiver  PLAN FOR NEXT SESSION: Continue with instruction of LSVT BIG maximal daily exercises   Genevieve Ritzel T Debra Colon, OTR/L, CLT  Kenesha Moshier, OT 04/12/2022, 3:00 PM

## 2022-04-12 NOTE — Therapy (Signed)
OUTPATIENT OCCUPATIONAL THERAPY NEURO EVALUATION  Patient Name: Pedro Swanson MRN: [id number] DOB:May 05, 1951, 71 y.o., male Today's Date: 04/04/2022  PCP: Eulis Foster REFERRING PROVIDER: Jennings Books  END OF SESSION:  OT End of Session - 04/12/22 1253     Visit Number 1    Number of Visits 17    Date for OT Re-Evaluation 05/16/22    OT Start Time 60    OT Stop Time 1400    OT Time Calculation (min) 60 min    Behavior During Therapy [facility] for tasks assessed/performed             Past Medical History:  Diagnosis Date   Cataract    COVID    Hyperlipidemia    Hypertension    Palpitations    Shingles    Past Surgical History:  Procedure Laterality Date   CATARACT EXTRACTION     right eye   Patient Active Problem List   Diagnosis Date Noted   OSA (obstructive sleep apnea) 12/14/2020   Paroxysmal atrial fibrillation ([place]) 07/11/2020   Pain in joint of left shoulder 01/11/2020   Atherosclerosis of abdominal aorta ([place]) 05/04/2019   Arteriosclerotic cardiovascular disease (ASCVD) 07/08/2018   Coronary artery disease involving native coronary artery of native heart 10/02/2017   Benign fibroma of prostate 01/18/2015   Hypercholesteremia 01/18/2015   BP (high blood pressure) 01/18/2015   Osteoarthrosis 01/18/2015   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 01/18/2015   Avitaminosis D 01/18/2015    ONSET DATE: 12/04/2021  REFERRING DIAG: Parkinsonism   THERAPY DIAG:  Muscle weakness (generalized)  Other lack of coordination  Unsteadiness on feet  Difficulty in walking, not elsewhere classified  Abnormal posture  Rationale for Evaluation and Treatment: Rehabilitation  SUBJECTIVE:   SUBJECTIVE STATEMENT: Pt reports he went to a neurologist who diagnosed him with Parkinsonism, reports he has had recent changes in his posture, gait and balance. Pt reports he has been trying to go to the [facility] over the last 3 weeks to work on getting stronger.  Reports 2  falls in recent past, one fall off of his bike and another fall while walking [place] the beach.   Pt accompanied by: significant other, wife Mardene Celeste  PERTINENT HISTORY: Pt seen by neurology this year for evalution for Parkinson like symptoms.  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  Possible neuropathy with paresthesia in bilateral feet with reduced intraepidermal nerve fiber density on distal right thigh found on syn-one test 01/19/2022. History of Obstructive sleep apnea on CPAP, REM behavior disorder, vitamin deficiency, Shingles, A fib.   PRECAUTIONS: Fall  WEIGHT BEARING RESTRICTIONS: No  PAIN:  Are you having pain? No  FALLS: Has patient fallen in last 6 months? Yes. Number of falls 1  LIVING ENVIRONMENT: Lives with: lives with their family and lives with their spouse Lives in: House/apartment Has following equipment at home: None  PLOF: Independent with basic ADLs  PATIENT GOALS: Pt reports he would like to improve his balance and reduce his risk of falls.    OBJECTIVE:   HAND DOMINANCE: Right  ADLs: Overall ADLs: modified independent Transfers/ambulation related  to ADLs: modified independent, no assistive device at this time Eating: modified independent Grooming: modified independent UB Dressing: modified independent, increased time for buttons, snaps and zippers LB Dressing: modified independent but requires increased time to complete tasks compared to a year ago Toileting: modified independent Bathing: modified  independent Tub Shower transfers: modified independent Equipment: none    IADLs: Shopping: able to complete small purchases, wife does most of the shopping Light housekeeping: able to help around the home with light tasks but wife does most of housekeeping chores. Meal Prep: modified independent Yardwork:  Pt reports requiring increased assistance for yardwork over time, they now moved to a townhome so he does not have to perform these tasks.   Community mobility: modified independent but reports slower with ambulation in recent months. He is still able to drive.  Medication management: Pt requires reminders for medication  Financial management: modified independent Handwriting: 75% legible Leisure tasks include:  Music, painting and sailing, pt reports he is not able to participate in many of these tasks as he did in the past, especially with sailing and decreased balance.  MOBILITY STATUS: Hx of falls  POSTURE COMMENTS:  rounded shoulders, forwards flexed posture Sitting balance: Sits without UE support up to 30 sec  ACTIVITY TOLERANCE: Activity tolerance: Pt reports increased time to perform tasks, will take rest breaks as needed.  FUNCTIONAL OUTCOME MEASURES: FOTO: 72.88  UPPER EXTREMITY ROM:    Active ROM Right eval Left eval  Shoulder flexion 130 90  Shoulder abduction 128 88  Shoulder adduction    Shoulder extension    Shoulder internal rotation    Shoulder external rotation    Elbow flexion 140 140  Elbow extension 0 0  Wrist flexion    Wrist extension    Wrist ulnar deviation    Wrist radial deviation    Wrist pronation    Wrist supination    (Blank rows = not tested)  UPPER EXTREMITY MMT:     MMT Right eval Left eval  Shoulder flexion 4/5 3-/5  Shoulder abduction 4/5 3-/5  Shoulder adduction    Shoulder extension    Shoulder internal rotation    Shoulder external rotation    Middle trapezius    Lower trapezius    Elbow flexion 4/5 4/5  Elbow  extension    Wrist flexion 4/5 4/5  Wrist extension 4/5 4/5  Wrist ulnar deviation    Wrist radial deviation    Wrist pronation    Wrist supination    (Blank rows = not tested)  HAND FUNCTION: Grip strength: Right: 95 lbs; Left: 94 lbs, Lateral pinch: Right: 25 lbs, Left: 17 lbs, and 3 point pinch: Right: 17 lbs, Left: 20 lbs  COORDINATION: 9 Hole Peg test: Right: 28 sec; Left: 34 sec  SENSATION: WFL  EDEMA: none  COGNITION: Overall cognitive status: Within functional limits for tasks assessed, Pt reports difficulty at times recalling names.  Appears to demonstrate slower processing of new information and requires increased time for new learning.  VISION: Subjective report: Reports he had cararacts but were removed. Baseline vision: Wears glasses for reading only Visual history: cataracts  VISION ASSESSMENT: WFL  Patient has difficulty with following activities due to following visual impairments: needs glasses when viewing printed text  PERCEPTION: WFL   ADDITIONAL TESTING:   6 minute walk test 790 feet.  Observations:  Pt with decreased reciprocal arm swing, decreased step length-tends to have short, shuffled gait, can be easily distracted during task.  Forward  flexed posture throughout test.   5 times sit to stand 14 secs BERG balance test: 43/56    TODAY'S TREATMENT:                                                                                                                              DATE: 04/04/2022  PATIENT EDUCATION: Education details: Pt and wife educated on LSVT BIG program, frequency, POC, pt's limitations Person educated: Patient and Spouse Education method: Explanation Education comprehension: verbalized understanding  HOME EXERCISE PROGRAM: Will initiate HEP next session when initiating treatment and implementation of LSVT BIG program    GOALS: Goals reviewed with patient? Yes  SHORT TERM GOALS: Target date: 04/20/2022  Pt to be able to  perform buttons with improved speed and dexterity for dress shirt in less than 1 min. Baseline: increased time, up to 2 min to complete Goal status: INITIAL    LONG TERM GOALS: Target date: 05/16/2022  Patient will improve gait speed and endurance to be able to ambulate  feet in 6 minutes to negotiate around the home and community safely in 4 weeks.  Baseline: Eval-6 min walk test Goal status: INITIAL  2.  Patient will complete HEP for maximal daily exercises with modified independence in 4 weeks.  Baseline: no current home program related to Parkinsonism symptoms. Goal status: INITIAL  3.  Patient will transfer from to sit to stand without the use of arms safely and independently from a variety of chairs/surfaces in 4 weeks.  Baseline: difficulty with sit to stand from lower surface heights and occasionally has to use arms to assist. Goal status: INITIAL  4.  Pt will score 74 or greater on FOTO to demonstrate a clinically relevant change to impact independence in ADL and IADL tasks at home and in the community.   Baseline: At eval FOTO: 72.88 Goal status: INITIAL  5.  Pt will improve BERG balance score by 5 points to demonstrate improved balance during functional mobility and during ADL and IADL to reduce fall risk.   Baseline: At eval score: 43 Goal status: INITIAL    ASSESSMENT:  CLINICAL IMPRESSION: Patient is a 71 yo male diagnosed with Parkinsonian symptoms and referred by his physician for LSVT BIG program.  Patient presents with decreased step length with gait patterns, decreased reciprocal arm swing, decreased balance, decreased transfers and transitional movements, history of falls, bilateral cog wheeling, decreased coordination and muscle strength which affect his ability to perform daily tasks.  The patient is judged to be an excellent candidate for the LSVT BIG program.  He would benefit from and was referred for the LSVT BIG program which is an intensive program  designed specifically for Parkinson's patients with a focus on increasing amplitude and speed of movements, improving self-care and daily tasks and providing patients with daily exercises to improve overall function.  It is recommended that the patient receive the LSVT BIG program, which is comprised of 16 intensive sessions, (  4 times a week for 4 weeks, one-hour sessions).  Prognosis for improvement is good based on patient's motivation and family support.  LSVT BIG has been documented in the literature as efficacious for individuals with Parkinson's disease and those with Parkinsonian symptoms.   PERFORMANCE DEFICITS: in functional skills including IADLs, coordination, dexterity, proprioception, ROM, strength, flexibility, Fine motor control, Gross motor control, mobility, balance, endurance, and UE functional use, cognitive skills including attention and memory, and psychosocial skills including environmental adaptation, habits, and routines and behaviors.   IMPAIRMENTS: are limiting patient from ADLs, IADLs, leisure, and social participation.   CO-MORBIDITIES: may have co-morbidities  that affects occupational performance. Patient will benefit from skilled OT to address above impairments and improve overall function.  MODIFICATION OR ASSISTANCE TO COMPLETE EVALUATION: No modification of tasks or assist necessary to complete an evaluation.  OT OCCUPATIONAL PROFILE AND HISTORY: Detailed assessment: Review of records and additional review of physical, cognitive, psychosocial history related to current functional performance.  CLINICAL DECISION MAKING: Moderate - several treatment options, min-mod task modification necessary  REHAB POTENTIAL: Excellent  EVALUATION COMPLEXITY: Low    PLAN:  OT FREQUENCY: 4x/week  OT DURATION: 4 weeks  PLANNED INTERVENTIONS: self care/ADL training, therapeutic exercise, therapeutic activity, neuromuscular re-education, manual therapy, passive range of motion,  gait training, balance training, functional mobility training, moist heat, and patient/family education  RECOMMENDED OTHER SERVICES: none at this time  CONSULTED AND AGREED WITH PLAN OF CARE: Patient and family member/caregiver  PLAN FOR NEXT SESSION: Initiate LSVT BIG program and instruct on maximal daily exercises   Kripa Foskey T Davon Folta, OTR/L, CLT  Pervis Macintyre, OT 04/12/2022, 1:56 PM

## 2022-04-12 NOTE — Therapy (Signed)
OUTPATIENT OCCUPATIONAL THERAPY NEURO TREATMENT  Patient Name: Pedro Swanson MRN: [id number] DOB:April 29, 1951, 71 y.o., male Today's Date: 04/10/2022  PCP: Eulis Foster REFERRING PROVIDER: Jennings Books  END OF SESSION:  OT End of Session - 04/12/22 1604     Visit Number 3    Number of Visits 17    Date for OT Re-Evaluation 05/16/22    OT Start Time 1102    OT Stop Time 1204    OT Time Calculation (min) 62 min    Activity Tolerance Patient tolerated treatment well    Behavior During Therapy [facility] for tasks assessed/performed             Past Medical History:  Diagnosis Date   Cataract    COVID    Hyperlipidemia    Hypertension    Palpitations    Shingles    Past Surgical History:  Procedure Laterality Date   CATARACT EXTRACTION     right eye   Patient Active Problem List   Diagnosis Date Noted   OSA (obstructive sleep apnea) 12/14/2020   Paroxysmal atrial fibrillation ([place]) 07/11/2020   Pain in joint of left shoulder 01/11/2020   Atherosclerosis of abdominal aorta ([place]) 05/04/2019   Arteriosclerotic cardiovascular disease (ASCVD) 07/08/2018   Coronary artery disease involving native coronary artery of native heart 10/02/2017   Benign fibroma of prostate 01/18/2015   Hypercholesteremia 01/18/2015   BP (high blood pressure) 01/18/2015   Osteoarthrosis 01/18/2015   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  He reviewed the exercises last night  with his wife and has a few questions regarding the pictures and how to perform specific ones.   PERTINENT HISTORY: Pt seen by neurology this year for evalution for Parkinson like symptoms.  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  Possible neuropathy with paresthesia in bilateral feet with reduced intraepidermal nerve fiber density on distal right thigh found on syn-one test 01/19/2022. History of Obstructive sleep apnea on CPAP, REM behavior disorder, vitamin deficiency, Shingles, A fib.   PRECAUTIONS: Fall  WEIGHT BEARING RESTRICTIONS: No  PAIN:  Are you having pain? No  FALLS: Has patient fallen in last 6 months? Yes. Number of falls 1  LIVING ENVIRONMENT: Lives with: lives with their family and lives with their spouse Lives in: House/apartment Has following equipment at home: None  PLOF: Independent with basic ADLs  PATIENT GOALS: Pt reports he would like to improve his balance and reduce his risk of falls.     HAND DOMINANCE: Right  ADLs: Overall ADLs: modified independent Transfers/ambulation related to ADLs: modified independent, no assistive device at this time Eating: modified independent Grooming:  modified independent UB Dressing: modified independent, increased time for buttons, snaps and zippers LB Dressing: modified independent but requires increased time to complete tasks compared to a year ago Toileting: modified independent Bathing: modified independent Tub Shower transfers: modified independent Equipment:  none    IADLs: Shopping: able to complete small purchases, wife does most of the shopping Light housekeeping: able to help around the home with light tasks but wife does most of housekeeping chores. Meal Prep: modified independent Yardwork:  Pt reports requiring increased assistance for yardwork over time, they now moved to a townhome so he does not have to perform these tasks.   Community mobility: modified independent but reports slower with ambulation in recent months. He is still able to drive.  Medication management: Pt requires reminders for medication  Financial management: modified independent Handwriting: 75% legible Leisure tasks include:  Music, painting and sailing, pt reports he is not able to participate in many of these tasks as he did in the past, especially with sailing and decreased balance.  MOBILITY STATUS: Hx of falls  POSTURE COMMENTS:  rounded shoulders, forwards flexed posture Sitting balance: Sits without UE support up to 30 sec  ACTIVITY TOLERANCE: Activity tolerance: Pt reports increased time to perform tasks, will take rest breaks as needed.  FUNCTIONAL OUTCOME MEASURES: FOTO: 72.88  UPPER EXTREMITY ROM:    Active ROM Right eval Left eval  Shoulder flexion 130 90  Shoulder abduction 128 88  Shoulder adduction    Shoulder extension    Shoulder internal rotation    Shoulder external rotation    Elbow flexion 140 140  Elbow extension 0 0  Wrist flexion    Wrist extension    Wrist ulnar deviation    Wrist radial deviation    Wrist pronation    Wrist supination    (Blank rows = not tested)  UPPER EXTREMITY MMT:     MMT Right eval Left eval  Shoulder flexion 4/5 3-/5  Shoulder abduction 4/5 3-/5  Shoulder adduction    Shoulder extension    Shoulder internal rotation    Shoulder external rotation    Middle trapezius    Lower trapezius    Elbow flexion 4/5 4/5  Elbow extension    Wrist flexion 4/5 4/5  Wrist extension 4/5 4/5  Wrist  ulnar deviation    Wrist radial deviation    Wrist pronation    Wrist supination    (Blank rows = not tested)  HAND FUNCTION: Grip strength: Right: 95 lbs; Left: 94 lbs, Lateral pinch: Right: 25 lbs, Left: 17 lbs, and 3 point pinch: Right: 17 lbs, Left: 20 lbs  COORDINATION: 9 Hole Peg test: Right: 28 sec; Left: 34 sec  SENSATION: WFL  EDEMA: none  COGNITION: Overall cognitive status: Within functional limits for tasks assessed, Pt reports difficulty at times recalling names.  Appears to demonstrate slower processing of new information and requires increased time for new learning.  VISION: Subjective report: Reports he had cararacts but were removed. Baseline vision: Wears glasses for reading only Visual history: cataracts  VISION ASSESSMENT: WFL  Patient has difficulty with following activities due to following visual impairments: needs glasses when viewing printed text  PERCEPTION: WFL   ADDITIONAL TESTING:   6 minute walk test 790 feet.  Observations:  Pt with decreased reciprocal arm swing, decreased step length-tends to have short, shuffled gait, can be easily distracted during task.  Forward flexed posture throughout test.   5 times sit to stand 14 secs BERG  balance test: 43/56    TODAY'S TREATMENT:                                                                                                                              DATE: 04/09/2022  Neuromuscular Reeducation:   Patient seen for initial instruction of LSVT BIG exercises: LSVT Daily Session Maximal Daily Exercises: Sustained movements are designed to rescale the amplitude of movement output for generalization to daily functional activities. Performed as follows for 1 set of 10 repetitions each: Multi directional sustained movements- 1) Floor to ceiling, 2) Side to side. Multi directional Repetitive movements performed in standing and are designed to provide retraining effort needed for sustained muscle  activation in tasks Performed as follows: 3) Step and reach forward, 4) Step and Reach Backwards, 5) Step and reach sideways, 6) Rock and reach forward/backward, 7) Rock and reach sideways. Sit to stand from mat table on lowest setting with cues for weight shift, technique and CGA for 10 reps for 1 set.    All exercises performed this date continued to require therapist demonstration, verbal and tactile cues for proper form and technique.  Min assist for balance with exercises in standing.  Pt continues to require increased time for processing of information and is able to verbalize the need for time.  Exercises were performed this date in the standard version but we did review the use of a chair for balance when performing at home.   Pt required the whole session to focus on maximal daily exercises and did not have time to focus on functional mobility beyond a short distance of 100 feet when exiting the clinic.  Therapist provided cues for reciprocal arm swing and amplitude of steps.    Pt did appear to have some perceptual difficulties with use of pictures from home exercise program with right and left discrimination and positioning of self to perform exercises in standing.    Will plan to further address posture next date.    PATIENT EDUCATION: Education details: Pt and wife educated on LSVT BIG program, frequency, POC, pt's limitations Person educated: Patient and Spouse Education method: Explanation Education comprehension: verbalized understanding  HOME EXERCISE PROGRAM: Will initiate HEP next session when initiating treatment and implementation of LSVT BIG program    GOALS: Goals reviewed with patient? Yes  SHORT TERM GOALS: Target date: 04/20/2022  Pt to be able to perform buttons with improved speed and dexterity for dress shirt in less than 1 min. Baseline: increased time, up to 2 min to complete Goal status: INITIAL    LONG TERM GOALS: Target date: 05/16/2022  Patient will  improve gait speed and endurance to be able to ambulate  feet in 6 minutes to negotiate around the home and community safely in 4 weeks.  Baseline: Eval-6 min walk test Goal status: INITIAL  2.  Patient will complete HEP for maximal daily exercises with modified independence in 4 weeks.  Baseline: no current  home program related to Parkinsonism symptoms. Goal status: INITIAL  3.  Patient will transfer from to sit to stand without the use of arms safely and independently from a variety of chairs/surfaces in 4 weeks.  Baseline: difficulty with sit to stand from lower surface heights and occasionally has to use arms to assist. Goal status: INITIAL  4.  Pt will score 74 or greater on FOTO to demonstrate a clinically relevant change to impact independence in ADL and IADL tasks at home and in the community.   Baseline: At eval FOTO: 72.88 Goal status: INITIAL  5.  Pt will improve BERG balance score by 5 points to demonstrate improved balance during functional mobility and during ADL and IADL to reduce fall risk.   Baseline: At eval score: 43 Goal status: INITIAL    ASSESSMENT:  CLINICAL IMPRESSION:  Pt reports some mild muscle soreness after first session of exercises.  He appears to demonstrate some difficulty with viewing handouts with pictures and has questions regarding right and left sides within the pictures.  Therapist reviewed each picture during demonstration and answered all questions.  Pt continues to demonstrate a forward flexed posture during all tasks and will benefit from additional exercises to help improve/correct posture and to minimize postural changes to decrease effect on balance.  Pt continues to require increased time to process new information introduced during sessions.  Continue to work towards goals in plan of care to impact balance deficits, posture, strength, and functional mobility for daily tasks.   PERFORMANCE DEFICITS: in functional skills including IADLs,  coordination, dexterity, proprioception, ROM, strength, flexibility, Fine motor control, Gross motor control, mobility, balance, endurance, and UE functional use, cognitive skills including attention and memory, and psychosocial skills including environmental adaptation, habits, and routines and behaviors.   IMPAIRMENTS: are limiting patient from ADLs, IADLs, leisure, and social participation.   CO-MORBIDITIES: may have co-morbidities  that affects occupational performance. Patient will benefit from skilled OT to address above impairments and improve overall function.  MODIFICATION OR ASSISTANCE TO COMPLETE EVALUATION: No modification of tasks or assist necessary to complete an evaluation.  OT OCCUPATIONAL PROFILE AND HISTORY: Detailed assessment: Review of records and additional review of physical, cognitive, psychosocial history related to current functional performance.  CLINICAL DECISION MAKING: Moderate - several treatment options, min-mod task modification necessary  REHAB POTENTIAL: Excellent  EVALUATION COMPLEXITY: Low  PLAN:  OT FREQUENCY: 4x/week  OT DURATION: 4 weeks  PLANNED INTERVENTIONS: self care/ADL training, therapeutic exercise, therapeutic activity, neuromuscular re-education, manual therapy, passive range of motion, gait training, balance training, functional mobility training, moist heat, and patient/family education  RECOMMENDED OTHER SERVICES: none at this time  CONSULTED AND AGREED WITH PLAN OF CARE: Patient and family member/caregiver  PLAN FOR NEXT SESSION: Continue with instruction of LSVT BIG maximal daily exercises   Jasin Brazel T Mayara Paulson, OTR/L, CLT  Estoria Geary, OT 04/12/2022, 4:06 PM

## 2022-04-13 ENCOUNTER — Ambulatory Visit: Payer: Medicare HMO | Admitting: Occupational Therapy

## 2022-04-13 ENCOUNTER — Ambulatory Visit: Payer: Medicare HMO | Attending: Neurology

## 2022-04-13 DIAGNOSIS — R262 Difficulty in walking, not elsewhere classified: Secondary | ICD-10-CM | POA: Insufficient documentation

## 2022-04-13 DIAGNOSIS — M6281 Muscle weakness (generalized): Secondary | ICD-10-CM | POA: Insufficient documentation

## 2022-04-13 DIAGNOSIS — R278 Other lack of coordination: Secondary | ICD-10-CM | POA: Diagnosis present

## 2022-04-13 DIAGNOSIS — R2681 Unsteadiness on feet: Secondary | ICD-10-CM | POA: Diagnosis present

## 2022-04-13 DIAGNOSIS — R293 Abnormal posture: Secondary | ICD-10-CM | POA: Diagnosis present

## 2022-04-14 NOTE — Therapy (Signed)
OUTPATIENT OCCUPATIONAL THERAPY NEURO TREATMENT  Patient Name: Pedro Swanson MRN: [id number] DOB:04/30/1951, 71 y.o., male Today's Date: 04/10/2022  PCP: Eulis Foster REFERRING PROVIDER: Jennings Books  END OF SESSION:  OT End of Session - 04/14/22 2008     Visit Number 5    Number of Visits 17    Date for OT Re-Evaluation 05/16/22    OT Start Time 0830    OT Stop Time 0930    OT Time Calculation (min) 60 min    Activity Tolerance Patient tolerated treatment well    Behavior During Therapy [facility] for tasks assessed/performed             Past Medical History:  Diagnosis Date   Cataract    COVID    Hyperlipidemia    Hypertension    Palpitations    Shingles    Past Surgical History:  Procedure Laterality Date   CATARACT EXTRACTION     right eye   Patient Active Problem List   Diagnosis Date Noted   OSA (obstructive sleep apnea) 12/14/2020   Paroxysmal atrial fibrillation ([place]) 07/11/2020   Pain in joint of left shoulder 01/11/2020   Atherosclerosis of abdominal aorta ([place]) 05/04/2019   Arteriosclerotic cardiovascular disease (ASCVD) 07/08/2018   Coronary artery disease involving native coronary artery of native heart 10/02/2017   Benign fibroma of prostate 01/18/2015   Hypercholesteremia 01/18/2015   BP (high blood pressure) 01/18/2015   Osteoarthrosis 01/18/2015   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    PERTINENT HISTORY: Pt seen by neurology this year for evalution for Parkinson like symptoms.  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  Possible neuropathy with paresthesia in bilateral feet with reduced intraepidermal nerve fiber density on distal right thigh found on syn-one test 01/19/2022. History of Obstructive sleep apnea on CPAP, REM behavior disorder, vitamin deficiency, Shingles, A fib.   PRECAUTIONS: Fall  WEIGHT BEARING RESTRICTIONS: No  PAIN:  Are you having pain? No  FALLS: Has patient fallen in last 6 months? Yes. Number of falls 1  LIVING ENVIRONMENT: Lives with: lives with their family and lives with their spouse Lives in: House/apartment Has following equipment at home: None  PLOF: Independent with basic ADLs  PATIENT GOALS: Pt reports he would like to improve his balance and reduce his risk of falls.     HAND DOMINANCE: Right  ADLs: Overall ADLs: modified independent Transfers/ambulation related to ADLs: modified independent, no assistive device at this time Eating: modified independent Grooming: modified independent UB Dressing: modified independent, increased time for buttons, snaps and zippers LB Dressing: modified independent but requires increased time to complete tasks compared to a year ago Toileting: modified independent Bathing: modified independent Tub Shower transfers: modified independent Equipment: none    IADLs: Shopping: able to complete small purchases, wife does most of  the shopping Light housekeeping: able to help around the home with light tasks but wife does most of housekeeping chores. Meal Prep: modified independent Yardwork:  Pt reports requiring increased assistance for yardwork over time, they  now moved to a townhome so he does not have to perform these tasks.   Community mobility: modified independent but reports slower with ambulation in recent months. He is still able to drive.  Medication management: Pt requires reminders for medication  Financial management: modified independent Handwriting: 75% legible Leisure tasks include:  Music, painting and sailing, pt reports he is not able to participate in many of these tasks as he did in the past, especially with sailing and decreased balance.  MOBILITY STATUS: Hx of falls  POSTURE COMMENTS:  rounded shoulders, forwards flexed posture Sitting balance: Sits without UE support up to 30 sec  ACTIVITY TOLERANCE: Activity tolerance: Pt reports increased time to perform tasks, will take rest breaks as needed.  FUNCTIONAL OUTCOME MEASURES: FOTO: 72.88  UPPER EXTREMITY ROM:    Active ROM Right eval Left eval  Shoulder flexion 130 90  Shoulder abduction 128 88  Shoulder adduction    Shoulder extension    Shoulder internal rotation    Shoulder external rotation    Elbow flexion 140 140  Elbow extension 0 0  Wrist flexion    Wrist extension    Wrist ulnar deviation    Wrist radial deviation    Wrist pronation    Wrist supination    (Blank rows = not tested)  UPPER EXTREMITY MMT:     MMT Right eval Left eval  Shoulder flexion 4/5 3-/5  Shoulder abduction 4/5 3-/5  Shoulder adduction    Shoulder extension    Shoulder internal rotation    Shoulder external rotation    Middle trapezius    Lower trapezius    Elbow flexion 4/5 4/5  Elbow extension    Wrist flexion 4/5 4/5  Wrist extension 4/5 4/5  Wrist ulnar deviation    Wrist radial deviation    Wrist pronation    Wrist supination    (Blank rows = not tested)  HAND FUNCTION: Grip strength: Right: 95 lbs; Left: 94 lbs, Lateral pinch: Right: 25 lbs, Left: 17 lbs, and 3 point pinch: Right: 17 lbs, Left: 20 lbs  COORDINATION: 9 Hole Peg test: Right: 28 sec;  Left: 34 sec  SENSATION: WFL  EDEMA: none  COGNITION: Overall cognitive status: Within functional limits for tasks assessed, Pt reports difficulty at times recalling names.  Appears to demonstrate slower processing of new information and requires increased time for new learning.  VISION: Subjective report: Reports he had cararacts but were removed. Baseline vision: Wears glasses for reading only Visual history: cataracts  VISION ASSESSMENT: WFL  Patient has difficulty with following activities due to following visual impairments: needs glasses when viewing printed text  PERCEPTION: WFL   ADDITIONAL TESTING:   6 minute walk test 790 feet.  Observations:  Pt with decreased reciprocal arm swing, decreased step length-tends to have short, shuffled gait, can be easily distracted during task.  Forward flexed posture throughout test.   5 times sit to stand 14 secs BERG balance test: 43/56    TODAY'S TREATMENT:  DATE: 04/13/2022  Neuromuscular Reeducation:   Patient seen for instruction of LSVT BIG exercises: LSVT Daily Session Maximal Daily Exercises: Sustained movements are designed to rescale the amplitude of movement output for generalization to daily functional activities. Performed as follows for 1 set of 10 repetitions each: Multi directional sustained movements- 1) Floor to ceiling, 2) Side to side. Multi directional Repetitive movements performed in standing and are designed to provide retraining effort needed for sustained muscle activation in tasks Performed as follows: 3) Step and reach forward, 4) Step and Reach Backwards, 5) Step and reach sideways, 6) Rock and reach forward/backward, 7) Rock and reach sideways. Sit to stand from mat table on lowest setting with cues for weight shift, technique and SBA for 10 reps for 1 set.    All exercises performed  this date continued to require verbal and tactile cues for proper form and technique.  Supv-min guard for balance with forward, backward, and side step and reach; min A for rock and reach sideways.  Pt continues to require increased time for processing of information and is able to verbalize the need for time.  Pt demonstrates frontal disinhibition, interrupting conversation during session and requires some redirection and focus to task.  Exercises were performed this date in the standard version.  Pt able to complete exercises this date in less time.  Instructed on posture exercises in standing with back against the wall, completing 3 reps of 1 min holds, manual cues for scapular retraction, head position and stretch to pectoralis muscles with place and hold.  Pt. worked on improving the amplitude of bilateral arm swing, alternating arm swing with the end point to the wall as a target for feedback x3 sets 10 reps each.  Pt reached to touch OT 's hands for the forward arm swing target.  BIG ambulation: Pt completed standing marches with tapping foot to tops of cone with each march x3 sets 10 reps each, requiring min guard-min A for balance.  Increased time with marching d/t LOB and knocking over a cone.  Further facilitated BIG ambulation with stepping over a line of 7 cones, max vc to step over and not around cones.  Reciprocal arm swing minimal, but pt struggled with foot clearance so no cues given for arm swing.    PATIENT EDUCATION: Education details: Pt and wife educated on LSVT BIG program, frequency, POC, pt's limitations Person educated: Patient and Spouse Education method: Explanation Education comprehension: verbalized understanding  HOME EXERCISE PROGRAM: Will initiate HEP next session when initiating treatment and implementation of LSVT BIG program    GOALS: Goals reviewed with patient? Yes  SHORT TERM GOALS: Target date: 04/20/2022  Pt to be able to perform buttons with improved speed  and dexterity for dress shirt in less than 1 min. Baseline: increased time, up to 2 min to complete Goal status: INITIAL    LONG TERM GOALS: Target date: 05/16/2022  Patient will improve gait speed and endurance to be able to ambulate  feet in 6 minutes to negotiate around the home and community safely in 4 weeks.  Baseline: Eval-6 min walk test Goal status: INITIAL  2.  Patient will complete HEP for maximal daily exercises with modified independence in 4 weeks.  Baseline: no current home program related to Parkinsonism symptoms. Goal status: INITIAL  3.  Patient will transfer from to sit to stand without the use of arms safely and independently from a variety of chairs/surfaces in 4 weeks.  Baseline: difficulty with sit to  stand from lower surface heights and occasionally has to use arms to assist. Goal status: INITIAL  4.  Pt will score 74 or greater on FOTO to demonstrate a clinically relevant change to impact independence in ADL and IADL tasks at home and in the community.   Baseline: At eval FOTO: 72.88 Goal status: INITIAL  5.  Pt will improve BERG balance score by 5 points to demonstrate improved balance during functional mobility and during ADL and IADL to reduce fall risk.   Baseline: At eval score: 43 Goal status: INITIAL    ASSESSMENT:  CLINICAL IMPRESSION: Pt becoming more familiar with exercises over the week but continues to require cues for proper form and technique as well as improving amplitude of movements.  Pt continues to require frequent redirection to task and demonstrates some frontal disinhibition.  He often responds negatively to cues when OT molds movements with tactile cues for better form, and reports that he is just learning these exercises.  OT provided positive feedback for pt's improvement with maximal daily exercises and educated that molding movements from the start to improve form is meant to help with neuroplasticity, helping pt to recalibrate  movements overtime, and corrections do not need to be viewed as negative.  Will continue to target posture during all activities, and increasing step height and length during maximal daily exercises.  Initiated cone taps with standing marches today, and stepping over cones to increase foot clearance with BIG walking and maximal daily exercises.  Pt requiring min guard-min A for balance with cone drills.  Increased time with marching d/t LOB and knocking over a cone, and max vc to step over and not around cones.  Reciprocal arm swing minimal during cone drills, but pt struggled with foot clearance so no cues given for arm swing.  Will continue to work towards improving performance of daily exercises with added complexity.  Will continue to add additional balance challenges over the next week.    PERFORMANCE DEFICITS: in functional skills including IADLs, coordination, dexterity, proprioception, ROM, strength, flexibility, Fine motor control, Gross motor control, mobility, balance, endurance, and UE functional use, cognitive skills including attention and memory, and psychosocial skills including environmental adaptation, habits, and routines and behaviors.   IMPAIRMENTS: are limiting patient from ADLs, IADLs, leisure, and social participation.   CO-MORBIDITIES: may have co-morbidities  that affects occupational performance. Patient will benefit from skilled OT to address above impairments and improve overall function.  MODIFICATION OR ASSISTANCE TO COMPLETE EVALUATION: No modification of tasks or assist necessary to complete an evaluation.  OT OCCUPATIONAL PROFILE AND HISTORY: Detailed assessment: Review of records and additional review of physical, cognitive, psychosocial history related to current functional performance.  CLINICAL DECISION MAKING: Moderate - several treatment options, min-mod task modification necessary  REHAB POTENTIAL: Excellent  EVALUATION COMPLEXITY: Low  PLAN:  OT  FREQUENCY: 4x/week  OT DURATION: 4 weeks  PLANNED INTERVENTIONS: self care/ADL training, therapeutic exercise, therapeutic activity, neuromuscular re-education, manual therapy, passive range of motion, gait training, balance training, functional mobility training, moist heat, and patient/family education  RECOMMENDED OTHER SERVICES: none at this time  CONSULTED AND AGREED WITH PLAN OF CARE: Patient and family member/caregiver  PLAN FOR NEXT SESSION: Continue with instruction of LSVT BIG maximal daily exercises   Leta Speller, MS, OTR/L   Darleene Cleaver, OT 04/14/2022, 8:10 PM

## 2022-04-16 ENCOUNTER — Ambulatory Visit: Payer: Medicare HMO

## 2022-04-16 DIAGNOSIS — M6281 Muscle weakness (generalized): Secondary | ICD-10-CM | POA: Diagnosis not present

## 2022-04-16 DIAGNOSIS — R2681 Unsteadiness on feet: Secondary | ICD-10-CM

## 2022-04-16 DIAGNOSIS — R278 Other lack of coordination: Secondary | ICD-10-CM

## 2022-04-16 NOTE — Therapy (Signed)
OUTPATIENT OCCUPATIONAL THERAPY NEURO TREATMENT  Patient Name: Pedro Swanson MRN: [id number] DOB:Jun 19, 1950, 71 y.o., male Today's Date: 04/10/2022  PCP: Eulis Foster REFERRING PROVIDER: Jennings Books  END OF SESSION:  OT End of Session - 04/16/22 1651     Visit Number 6    Number of Visits 17    Date for OT Re-Evaluation 05/16/22    OT Start Time 1350    OT Stop Time 1450    OT Time Calculation (min) 60 min    Activity Tolerance Patient tolerated treatment well    Behavior During Therapy [facility] for tasks assessed/performed             Past Medical History:  Diagnosis Date   Cataract    COVID    Hyperlipidemia    Hypertension    Palpitations    Shingles    Past Surgical History:  Procedure Laterality Date   CATARACT EXTRACTION     right eye   Patient Active Problem List   Diagnosis Date Noted   OSA (obstructive sleep apnea) 12/14/2020   Paroxysmal atrial fibrillation ([place]) 07/11/2020   Pain in joint of left shoulder 01/11/2020   Atherosclerosis of abdominal aorta ([place]) 05/04/2019   Arteriosclerotic cardiovascular disease (ASCVD) 07/08/2018   Coronary artery disease involving native coronary artery of native heart 10/02/2017   Benign fibroma of prostate 01/18/2015   Hypercholesteremia 01/18/2015   BP (high blood pressure) 01/18/2015   Osteoarthrosis 01/18/2015   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   PERTINENT HISTORY: Pt seen by neurology this year for evalution for Parkinson like symptoms.  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  Possible neuropathy with paresthesia in bilateral feet with reduced intraepidermal nerve fiber density on distal right thigh found on syn-one test 01/19/2022. History of Obstructive sleep apnea on CPAP, REM behavior disorder, vitamin deficiency, Shingles, A fib.   PRECAUTIONS: Fall  WEIGHT BEARING RESTRICTIONS: No  PAIN:  Are you having pain? No  FALLS: Has patient fallen in last 6 months? Yes. Number of falls 1  LIVING ENVIRONMENT: Lives with: lives with their family and lives with their spouse Lives in: House/apartment Has following equipment at home: None  PLOF: Independent with basic ADLs  PATIENT GOALS: Pt reports he would like to improve his balance and reduce his risk of falls.     HAND DOMINANCE: Right  ADLs: Overall ADLs: modified independent Transfers/ambulation related to ADLs: modified independent, no assistive device at this time Eating: modified independent Grooming: modified independent UB Dressing: modified independent, increased time for buttons, snaps and zippers LB Dressing: modified independent but requires increased time to complete tasks compared to a year ago Toileting: modified independent Bathing: modified independent Tub Shower transfers: modified independent Equipment:  none    IADLs: Shopping: able to complete small purchases, wife does most of the shopping Light housekeeping: able to help around the home with light tasks but wife does most of housekeeping chores. Meal Prep: modified  independent Yardwork:  Pt reports requiring increased assistance for yardwork over time, they now moved to a townhome so he does not have to perform these tasks.   Community mobility: modified independent but reports slower with ambulation in recent months. He is still able to drive.  Medication management: Pt requires reminders for medication  Financial management: modified independent Handwriting: 75% legible Leisure tasks include:  Music, painting and sailing, pt reports he is not able to participate in many of these tasks as he did in the past, especially with sailing and decreased balance.  MOBILITY STATUS: Hx of falls  POSTURE COMMENTS:  rounded shoulders, forwards flexed posture Sitting balance: Sits without UE support up to 30 sec  ACTIVITY TOLERANCE: Activity tolerance: Pt reports increased time to perform tasks, will take rest breaks as needed.  FUNCTIONAL OUTCOME MEASURES: FOTO: 72.88  UPPER EXTREMITY ROM:    Active ROM Right eval Left eval  Shoulder flexion 130 90  Shoulder abduction 128 88  Shoulder adduction    Shoulder extension    Shoulder internal rotation    Shoulder external rotation    Elbow flexion 140 140  Elbow extension 0 0  Wrist flexion    Wrist extension    Wrist ulnar deviation    Wrist radial deviation    Wrist pronation    Wrist supination    (Blank rows = not tested)  UPPER EXTREMITY MMT:     MMT Right eval Left eval  Shoulder flexion 4/5 3-/5  Shoulder abduction 4/5 3-/5  Shoulder adduction    Shoulder extension    Shoulder internal rotation    Shoulder external rotation    Middle trapezius    Lower trapezius    Elbow flexion 4/5 4/5  Elbow extension    Wrist flexion 4/5 4/5  Wrist extension 4/5 4/5  Wrist ulnar deviation    Wrist radial deviation    Wrist pronation    Wrist supination    (Blank rows = not tested)  HAND FUNCTION: Grip strength: Right: 95 lbs; Left: 94 lbs, Lateral pinch: Right: 25 lbs, Left: 17 lbs, and  3 point pinch: Right: 17 lbs, Left: 20 lbs  COORDINATION: 9 Hole Peg test: Right: 28 sec; Left: 34 sec  SENSATION: WFL  EDEMA: none  COGNITION: Overall cognitive status: Within functional limits for tasks assessed, Pt reports difficulty at times recalling names.  Appears to demonstrate slower processing of new information and requires increased time for new learning.  VISION: Subjective report: Reports he had cararacts but were removed. Baseline vision: Wears glasses for reading only Visual history: cataracts  VISION ASSESSMENT: WFL  Patient has difficulty with following activities due to following visual impairments: needs glasses when viewing printed text  PERCEPTION: WFL   ADDITIONAL TESTING:   6 minute walk test 790 feet.  Observations:  Pt with decreased reciprocal arm swing, decreased step length-tends to have short, shuffled gait, can be easily distracted during task.  Forward flexed posture throughout test.   5 times sit to stand 14 secs BERG balance test: 43/56  TODAY'S TREATMENT:  DATE: 04/16/2022  Neuromuscular Reeducation:   Patient seen for instruction of LSVT BIG exercises: LSVT Daily Session Maximal Daily Exercises: Sustained movements are designed to rescale the amplitude of movement output for generalization to daily functional activities. Performed as follows for 1 set of 10 repetitions each: Multi directional sustained movements- 1) Floor to ceiling, 2) Side to side. Multi directional Repetitive movements performed in standing and are designed to provide retraining effort needed for sustained muscle activation in tasks Performed as follows: 3) Step and reach forward, 4) Step and Reach Backwards, 5) Step and reach sideways, 6) Rock and reach forward/backward, 7) Rock and reach sideways. Sit to stand from mat table on lowest setting with cues  for weight shift, technique and SBA for 10 reps for 1 set.    All exercises performed this date continued to require verbal and tactile cues for proper form and technique.  Min guard for balance with forward, backward, and side step and reach; min guard-min A for rock and reach sideways; OT provided visual cue on wall for pt to look for with each trunk rotation.  For the forward, backward, and side step and reach, pt required initial verbal and visual cue to step foot back all the way to starting position and was able to make an improved step length with this cue.  Pt continues to require increased time for processing of information and is able to verbalize the need for time.  Pt demonstrates frontal disinhibition, interrupting conversation during session and requires some redirection and focus to task.  Exercises were performed this date in the standard version.  Pt able to complete exercises this date in less time.  Instructed on posture exercises in standing with back against the wall, completing 3 reps of 1 min holds, manual cues for scapular retraction, head position and stretch to pectoralis muscles with place and hold.  Pt. worked on improving the amplitude of bilateral arm swing, alternating arm swing with the end point to the wall as a target for feedback x3 sets 10 reps each.  Pt completed seated marches x3 sets 10 reps, knees touching hands for visual and tactile cue of marching height.  Completed standing marches with tapping foot to tops of cone with each march x3 sets 10 reps each, requiring min guard-min A for balance.  Improved balance with fewer cones knocked over when pt was cued to increase lateral weight shift.  Further facilitated BIG ambulation with stepping over a line of 7 cones, max vc to step over and not around cones.  Reciprocal arm swing minimal, but pt struggled with foot clearance so no cues given for arm swing.  Practiced BIG walking in hallway x10 min with vc for posture.  OT gave a  verbal cue to "stop," if feet shuffled.  Pt then able to correct foot clearance.  Pt has difficulty incorporating arm swing when focusing on posture and foot clearance.  PATIENT EDUCATION: Education details: Maximal daily exercises Person educated: Patient and Spouse Education method: Explanation Education comprehension: verbalized understanding  HOME EXERCISE PROGRAM: Maximal daily exercises; seated marches  GOALS: Goals reviewed with patient? Yes  SHORT TERM GOALS: Target date: 04/20/2022  Pt to be able to perform buttons with improved speed and dexterity for dress shirt in less than 1 min. Baseline: increased time, up to 2 min to complete Goal status: INITIAL   LONG TERM GOALS: Target date: 05/16/2022  Patient will improve gait speed and endurance to be able to ambulate  feet in  6 minutes to negotiate around the home and community safely in 4 weeks.  Baseline: Eval-6 min walk test Goal status: INITIAL  2.  Patient will complete HEP for maximal daily exercises with modified independence in 4 weeks.  Baseline: no current home program related to Parkinsonism symptoms. Goal status: INITIAL  3.  Patient will transfer from to sit to stand without the use of arms safely and independently from a variety of chairs/surfaces in 4 weeks.  Baseline: difficulty with sit to stand from lower surface heights and occasionally has to use arms to assist. Goal status: INITIAL  4.  Pt will score 74 or greater on FOTO to demonstrate a clinically relevant change to impact independence in ADL and IADL tasks at home and in the community.   Baseline: At eval FOTO: 72.88 Goal status: INITIAL  5.  Pt will improve BERG balance score by 5 points to demonstrate improved balance during functional mobility and during ADL and IADL to reduce fall risk.   Baseline: At eval score: 43 Goal status: INITIAL    ASSESSMENT:  CLINICAL IMPRESSION: Pt becoming more familiar with exercises but continues to  require cues for proper form and technique.  Pt with noted improvement with amplitude of steps during the forward, backward, and side step and reach exercises.  Pt continues to require frequent redirection to task and demonstrates some frontal disinhibition.  Pt showed less resistance to corrections today during maximal daily exercises.  Initiated seated marches today and encouraged these as a carry over task for home.  Pt to use hands as a target for knee to hit with each march.  Pt completed cone taps with standing marches today with initial min A, but improved to min guard for balance after OT provided cue for increased lateral weight shifting.  Pt continues to require min guard-min A for balance with stepping over a row of 7 cones.  Max vc to step over and not around cones.  Reciprocal arm swing minimal during cone drills, but pt struggled with foot clearance so no cues given for arm swing.  Practiced BIG walking in hallway x10 min with focus on posture and foot clearance.  Intermittent vc for posture.  OT gave a verbal cue to "stop," if feet shuffled.  Pt then able to correct foot clearance with intermittent cues to maintain.  Pt has difficulty incorporating arm swing when focusing on posture and foot clearance.  Will continue to work towards improving performance of daily exercises with added complexity, including dual tasking.    PERFORMANCE DEFICITS: in functional skills including IADLs, coordination, dexterity, proprioception, ROM, strength, flexibility, Fine motor control, Gross motor control, mobility, balance, endurance, and UE functional use, cognitive skills including attention and memory, and psychosocial skills including environmental adaptation, habits, and routines and behaviors.   IMPAIRMENTS: are limiting patient from ADLs, IADLs, leisure, and social participation.   CO-MORBIDITIES: may have co-morbidities  that affects occupational performance. Patient will benefit from skilled OT to  address above impairments and improve overall function.  MODIFICATION OR ASSISTANCE TO COMPLETE EVALUATION: No modification of tasks or assist necessary to complete an evaluation.  OT OCCUPATIONAL PROFILE AND HISTORY: Detailed assessment: Review of records and additional review of physical, cognitive, psychosocial history related to current functional performance.  CLINICAL DECISION MAKING: Moderate - several treatment options, min-mod task modification necessary  REHAB POTENTIAL: Excellent  EVALUATION COMPLEXITY: Low  PLAN:  OT FREQUENCY: 4x/week  OT DURATION: 4 weeks  PLANNED INTERVENTIONS: self care/ADL training, therapeutic exercise, therapeutic  activity, neuromuscular re-education, manual therapy, passive range of motion, gait training, balance training, functional mobility training, moist heat, and patient/family education  RECOMMENDED OTHER SERVICES: none at this time  CONSULTED AND AGREED WITH PLAN OF CARE: Patient and family member/caregiver  PLAN FOR NEXT SESSION: Continue with instruction of LSVT BIG maximal daily exercises   Leta Speller, MS, OTR/L   Darleene Cleaver, OT 04/16/2022, 4:53 PM

## 2022-04-17 ENCOUNTER — Ambulatory Visit: Payer: Medicare HMO | Admitting: Occupational Therapy

## 2022-04-17 DIAGNOSIS — R2681 Unsteadiness on feet: Secondary | ICD-10-CM

## 2022-04-17 DIAGNOSIS — R293 Abnormal posture: Secondary | ICD-10-CM

## 2022-04-17 DIAGNOSIS — M6281 Muscle weakness (generalized): Secondary | ICD-10-CM | POA: Diagnosis not present

## 2022-04-17 DIAGNOSIS — R262 Difficulty in walking, not elsewhere classified: Secondary | ICD-10-CM

## 2022-04-17 DIAGNOSIS — R278 Other lack of coordination: Secondary | ICD-10-CM

## 2022-04-18 ENCOUNTER — Encounter: Payer: Medicare HMO | Admitting: Occupational Therapy

## 2022-04-18 ENCOUNTER — Encounter: Payer: Self-pay | Admitting: Occupational Therapy

## 2022-04-18 NOTE — Therapy (Signed)
OUTPATIENT OCCUPATIONAL THERAPY NEURO TREATMENT  Patient Name: Pedro Swanson MRN: [id number] DOB:08/24/1950, 71 y.o., male Today's Date: 04/17/2022  PCP: Eulis Foster REFERRING PROVIDER: Jennings Books  END OF SESSION:  OT End of Session - 04/20/22 1048     Visit Number 7    Number of Visits 17    Date for OT Re-Evaluation 05/16/22    OT Start Time 1300    OT Stop Time 1400    OT Time Calculation (min) 60 min    Activity Tolerance Patient tolerated treatment well    Behavior During Therapy [facility] for tasks assessed/performed              Past Medical History:  Diagnosis Date   Cataract    COVID    Hyperlipidemia    Hypertension    Palpitations    Shingles    Past Surgical History:  Procedure Laterality Date   CATARACT EXTRACTION     right eye   Patient Active Problem List   Diagnosis Date Noted   OSA (obstructive sleep apnea) 12/14/2020   Paroxysmal atrial fibrillation ([place]) 07/11/2020   Pain in joint of left shoulder 01/11/2020   Atherosclerosis of abdominal aorta ([place]) 05/04/2019   Arteriosclerotic cardiovascular disease (ASCVD) 07/08/2018   Coronary artery disease involving native coronary artery of native heart 10/02/2017   Benign fibroma of prostate 01/18/2015   Hypercholesteremia 01/18/2015   BP (high blood pressure) 01/18/2015   Osteoarthrosis 01/18/2015   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  Pt reports he has been doing exercises as outlined, 2  times a day.    PERTINENT HISTORY: Pt seen by neurology this year for evalution for Parkinson like symptoms.  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  Possible neuropathy with paresthesia in bilateral feet with reduced intraepidermal nerve fiber density on distal right thigh found on syn-one test 01/19/2022. History of Obstructive sleep apnea on CPAP, REM behavior disorder, vitamin deficiency, Shingles, A fib.   PRECAUTIONS: Fall  WEIGHT BEARING RESTRICTIONS: No  PAIN:  Are you having pain? No  FALLS: Has patient fallen in last 6 months? Yes. Number of falls 1  LIVING ENVIRONMENT: Lives with: lives with their family and lives with their spouse Lives in: House/apartment Has following equipment at home: None  PLOF: Independent with basic ADLs  PATIENT GOALS: Pt reports he would like to improve his balance and reduce his risk of falls.     HAND DOMINANCE: Right  ADLs: Overall ADLs: modified independent Transfers/ambulation related to ADLs: modified independent, no assistive device at this time Eating: modified independent Grooming: modified independent UB Dressing: modified independent, increased time  for buttons, snaps and zippers LB Dressing: modified independent but requires increased time to complete tasks compared to a year ago Toileting: modified independent Bathing: modified independent Tub Shower transfers: modified independent Equipment: none    IADLs: Shopping: able to complete small purchases, wife does most of  the shopping Light housekeeping: able to help around the home with light tasks but wife does most of housekeeping chores. Meal Prep: modified independent Yardwork:  Pt reports requiring increased assistance for yardwork over time, they now moved to a townhome so he does not have to perform these tasks.   Community mobility: modified independent but reports slower with ambulation in recent months. He is still able to drive.  Medication management: Pt requires reminders for medication  Financial management: modified independent Handwriting: 75% legible Leisure tasks include:  Music, painting and sailing, pt reports he is not able to participate in many of these tasks as he did in the past, especially with sailing and decreased balance.  MOBILITY STATUS: Hx of falls  POSTURE COMMENTS:  rounded shoulders, forwards flexed posture Sitting balance: Sits without UE support up to 30 sec  ACTIVITY TOLERANCE: Activity tolerance: Pt reports increased time to perform tasks, will take rest breaks as needed.  FUNCTIONAL OUTCOME MEASURES: FOTO: 72.88  UPPER EXTREMITY ROM:    Active ROM Right eval Left eval  Shoulder flexion 130 90  Shoulder abduction 128 88  Shoulder adduction    Shoulder extension    Shoulder internal rotation    Shoulder external rotation    Elbow flexion 140 140  Elbow extension 0 0  Wrist flexion    Wrist extension    Wrist ulnar deviation    Wrist radial deviation    Wrist pronation    Wrist supination    (Blank rows = not tested)  UPPER EXTREMITY MMT:     MMT Right eval Left eval  Shoulder flexion 4/5 3-/5  Shoulder abduction 4/5 3-/5  Shoulder adduction    Shoulder extension    Shoulder internal rotation    Shoulder external rotation    Middle trapezius    Lower trapezius    Elbow flexion 4/5 4/5  Elbow extension    Wrist flexion 4/5 4/5  Wrist extension 4/5 4/5  Wrist ulnar deviation    Wrist radial deviation    Wrist pronation    Wrist  supination    (Blank rows = not tested)  HAND FUNCTION: Grip strength: Right: 95 lbs; Left: 94 lbs, Lateral pinch: Right: 25 lbs, Left: 17 lbs, and 3 point pinch: Right: 17 lbs, Left: 20 lbs  COORDINATION: 9 Hole Peg test: Right: 28 sec; Left: 34 sec  SENSATION: WFL  EDEMA: none  COGNITION: Overall cognitive status: Within functional limits for tasks assessed, Pt reports difficulty at times recalling names.  Appears to demonstrate slower processing of new information and requires increased time for new learning.  VISION: Subjective report: Reports he had cararacts but were removed. Baseline vision: Wears glasses for reading only Visual history: cataracts  VISION ASSESSMENT: WFL  Patient has difficulty with following activities due to following visual impairments: needs glasses when viewing printed text  PERCEPTION: WFL   ADDITIONAL TESTING:   6 minute walk test 790 feet.  Observations:  Pt with decreased reciprocal arm swing, decreased step length-tends to have short, shuffled gait, can be easily distracted during task.  Forward flexed posture throughout test.   5 times sit to stand 14 secs BERG balance test: 43/56  TODAY'S TREATMENT:  DATE: 04/17/2022  Neuromuscular Reeducation:   Patient seen for instruction of LSVT BIG exercises: LSVT Daily Session Maximal Daily Exercises: Sustained movements are designed to rescale the amplitude of movement output for generalization to daily functional activities. Performed as follows for 1 set of 10 repetitions each: Multi directional sustained movements- 1) Floor to ceiling, 2) Side to side. Multi directional Repetitive movements performed in standing and are designed to provide retraining effort needed for sustained muscle activation in tasks Performed as follows: 3) Step and reach forward, 4) Step and Reach  Backwards, 5) Step and reach sideways, 6) Rock and reach forward/backward, 7) Rock and reach sideways. Sit to stand from mat table on lowest setting with cues for weight shift, technique and SBA for 10 reps for 1 set.    All exercises performed this date continued to require verbal and tactile cues for proper form and technique.  Min guard for balance with forward, backward, and side step and reach; min guard-min A for rock and reach sideways;  Pt continues to require increased time for processing of information and is able to verbalize the need for time.  Pt demonstrates frontal disinhibition, interrupting conversation during session and requires some redirection and focus to task.  Exercises were performed this date in the standard version with min guard to min assist as needed.   Instructed on posture exercises in standing with back against the wall, completing 3 reps of 1 min holds, manual cues for scapular retraction, head position and stretch to pectoralis muscles with place and hold.     Functional mobility:  Pt seen for functional mobility with BIG walking in hallway for 900 feet, moderate cues for amplitude of steps as well as reciprocal arm swing.  When patient is distracted by conversation, he tends to revert back to shorter, shuffling steps.  Pt also requires cues for posture during mobility for a more upright stance.     PATIENT EDUCATION: Education details: Maximal daily exercises Person educated: Patient and Spouse Education method: Explanation Education comprehension: verbalized understanding  HOME EXERCISE PROGRAM: Maximal daily exercises; seated marches  GOALS: Goals reviewed with patient? Yes  SHORT TERM GOALS: Target date: 04/20/2022  Pt to be able to perform buttons with improved speed and dexterity for dress shirt in less than 1 min. Baseline: increased time, up to 2 min to complete Goal status: INITIAL   LONG TERM GOALS: Target date: 05/16/2022  Patient will improve  gait speed and endurance to be able to ambulate  feet in 6 minutes to negotiate around the home and community safely in 4 weeks.  Baseline: Eval-6 min walk test Goal status: INITIAL  2.  Patient will complete HEP for maximal daily exercises with modified independence in 4 weeks.  Baseline: no current home program related to Parkinsonism symptoms. Goal status: INITIAL  3.  Patient will transfer from to sit to stand without the use of arms safely and independently from a variety of chairs/surfaces in 4 weeks.  Baseline: difficulty with sit to stand from lower surface heights and occasionally has to use arms to assist. Goal status: INITIAL  4.  Pt will score 74 or greater on FOTO to demonstrate a clinically relevant change to impact independence in ADL and IADL tasks at home and in the community.   Baseline: At eval FOTO: 72.88 Goal status: INITIAL  5.  Pt will improve BERG balance score by 5 points to demonstrate improved balance during functional mobility and during ADL and IADL to reduce fall  risk.   Baseline: At eval score: 43 Goal status: INITIAL    ASSESSMENT:  CLINICAL IMPRESSION: Pt has continued to make good progress in all areas.  Pt requires redirection to task during session especially when distracted by movement of other people in and out of the spaces and with conversation.  Pt is a very social person, so he tends to speak to each person in the room and in the hallways.  When distracted, he tends to lose form with functional mobility demonstrating decreased amplitude of step length/height, posture and reciprocal arm swing.  He is progressing with maximal daily exercises, able to recall most exercises without use of paper, but still requires cues for proper form and technique at times with select exercises.  He tends to want to twist at the trunk with side to side exercise but able to correct with therapist demonstration and cues.  Continue to work towards goals in plan of care to  maximize safety and independence in necessary daily tasks.    PERFORMANCE DEFICITS: in functional skills including IADLs, coordination, dexterity, proprioception, ROM, strength, flexibility, Fine motor control, Gross motor control, mobility, balance, endurance, and UE functional use, cognitive skills including attention and memory, and psychosocial skills including environmental adaptation, habits, and routines and behaviors.   IMPAIRMENTS: are limiting patient from ADLs, IADLs, leisure, and social participation.   CO-MORBIDITIES: may have co-morbidities  that affects occupational performance. Patient will benefit from skilled OT to address above impairments and improve overall function.  MODIFICATION OR ASSISTANCE TO COMPLETE EVALUATION: No modification of tasks or assist necessary to complete an evaluation.  OT OCCUPATIONAL PROFILE AND HISTORY: Detailed assessment: Review of records and additional review of physical, cognitive, psychosocial history related to current functional performance.  CLINICAL DECISION MAKING: Moderate - several treatment options, min-mod task modification necessary  REHAB POTENTIAL: Excellent  EVALUATION COMPLEXITY: Low  PLAN:  OT FREQUENCY: 4x/week  OT DURATION: 4 weeks  PLANNED INTERVENTIONS: self care/ADL training, therapeutic exercise, therapeutic activity, neuromuscular re-education, manual therapy, passive range of motion, gait training, balance training, functional mobility training, moist heat, and patient/family education  RECOMMENDED OTHER SERVICES: none at this time  CONSULTED AND AGREED WITH PLAN OF CARE: Patient and family member/caregiver  PLAN FOR NEXT SESSION: Continue with instruction of LSVT BIG maximal daily exercises   Klyde Banka T Encarnacion Scioneaux, OTR/L, CLT  Ita Fritzsche, OT 04/20/2022, 10:48 AM

## 2022-04-19 ENCOUNTER — Ambulatory Visit: Payer: Medicare HMO | Admitting: Occupational Therapy

## 2022-04-19 DIAGNOSIS — M6281 Muscle weakness (generalized): Secondary | ICD-10-CM | POA: Diagnosis not present

## 2022-04-19 DIAGNOSIS — R2681 Unsteadiness on feet: Secondary | ICD-10-CM

## 2022-04-19 DIAGNOSIS — R262 Difficulty in walking, not elsewhere classified: Secondary | ICD-10-CM

## 2022-04-19 DIAGNOSIS — R293 Abnormal posture: Secondary | ICD-10-CM

## 2022-04-19 DIAGNOSIS — R278 Other lack of coordination: Secondary | ICD-10-CM

## 2022-04-20 ENCOUNTER — Encounter: Payer: Self-pay | Admitting: Occupational Therapy

## 2022-04-20 ENCOUNTER — Ambulatory Visit: Payer: Medicare HMO | Admitting: Occupational Therapy

## 2022-04-20 DIAGNOSIS — R293 Abnormal posture: Secondary | ICD-10-CM

## 2022-04-20 DIAGNOSIS — M6281 Muscle weakness (generalized): Secondary | ICD-10-CM

## 2022-04-20 DIAGNOSIS — R262 Difficulty in walking, not elsewhere classified: Secondary | ICD-10-CM

## 2022-04-20 DIAGNOSIS — R278 Other lack of coordination: Secondary | ICD-10-CM

## 2022-04-20 DIAGNOSIS — R2681 Unsteadiness on feet: Secondary | ICD-10-CM

## 2022-04-20 NOTE — Therapy (Signed)
OUTPATIENT OCCUPATIONAL THERAPY NEURO TREATMENT  Patient Name: Pedro Swanson MRN: [id number] DOB:Feb 15, 1951, 71 y.o., male Today's Date: 04/20/2022  PCP: Ronnald Ramp REFERRING PROVIDER: Cristopher Peru  END OF SESSION:  OT End of Session - 04/24/22 1950     Visit Number 9    Number of Visits 17    Date for OT Re-Evaluation 05/16/22    OT Start Time 1100    OT Stop Time 1201    OT Time Calculation (min) 61 min    Activity Tolerance Patient tolerated treatment well    Behavior During Therapy [facility] for tasks assessed/performed              Past Medical History:  Diagnosis Date   Cataract    COVID    Hyperlipidemia    Hypertension    Palpitations    Shingles    Past Surgical History:  Procedure Laterality Date   CATARACT EXTRACTION     right eye   Patient Active Problem List   Diagnosis Date Noted   OSA (obstructive sleep apnea) 12/14/2020   Paroxysmal atrial fibrillation (HCC) 07/11/2020   Pain in joint of left shoulder 01/11/2020   Atherosclerosis of abdominal aorta (HCC) 05/04/2019   Arteriosclerotic cardiovascular disease (ASCVD) 07/08/2018   Coronary artery disease involving native coronary artery of native heart 10/02/2017   Benign fibroma of prostate 01/18/2015   Hypercholesteremia 01/18/2015   BP (high blood pressure) 01/18/2015   Osteoarthrosis 01/18/2015   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 01/18/2015   Avitaminosis D 01/18/2015    ONSET DATE: 12/04/2021  REFERRING DIAG: Parkinsonism   THERAPY DIAG:  Muscle weakness (generalized)  Other lack of coordination  Unsteadiness on feet  Abnormal posture  Difficulty in walking, not elsewhere classified  Rationale for Evaluation and Treatment: Rehabilitation  SUBJECTIVE:   SUBJECTIVE STATEMENT:  Pt reports his wife is still out of town until probably Sunday, he has been preparing food himself at home and going to the [facility] daily for exercise.    PERTINENT HISTORY: Pt seen by  neurology this year for evalution for Parkinson like symptoms.  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  Possible neuropathy with paresthesia in bilateral feet with reduced intraepidermal nerve fiber density on distal right thigh found on syn-one test 01/19/2022. History of Obstructive sleep apnea on CPAP, REM behavior disorder, vitamin deficiency, Shingles, A fib.   PRECAUTIONS: Fall  WEIGHT BEARING RESTRICTIONS: No  PAIN:  Are you having pain? No  FALLS: Has patient fallen in last 6 months? Yes. Number of falls 1  LIVING ENVIRONMENT: Lives with: lives with their family and lives with their spouse Lives in: House/apartment Has following equipment at home: None  PLOF: Independent with basic ADLs  PATIENT GOALS: Pt reports he would like to improve his balance and reduce his risk of falls.     HAND DOMINANCE: Right  ADLs: Overall ADLs: modified independent Transfers/ambulation related to ADLs: modified independent, no assistive device at this time Eating: modified independent Grooming: modified independent UB Dressing: modified independent, increased time for buttons, snaps and zippers LB Dressing: modified independent but requires increased time to  complete tasks compared to a year ago Toileting: modified independent Bathing: modified independent Tub Shower transfers: modified independent Equipment: none    IADLs: Shopping: able to complete small purchases, wife does most of the shopping Light housekeeping: able to help  around the home with light tasks but wife does most of housekeeping chores. Meal Prep: modified independent Yardwork:  Pt reports requiring increased assistance for yardwork over time, they now moved to a townhome so he does not have to perform these tasks.   Community mobility: modified independent but reports slower with ambulation in recent months. He is still able to drive.  Medication management: Pt requires reminders for medication  Financial management: modified independent Handwriting: 75% legible Leisure tasks include:  Music, painting and sailing, pt reports he is not able to participate in many of these tasks as he did in the past, especially with sailing and decreased balance.  MOBILITY STATUS: Hx of falls  POSTURE COMMENTS:  rounded shoulders, forwards flexed posture Sitting balance: Sits without UE support up to 30 sec  ACTIVITY TOLERANCE: Activity tolerance: Pt reports increased time to perform tasks, will take rest breaks as needed.  FUNCTIONAL OUTCOME MEASURES: FOTO: 72.88  UPPER EXTREMITY ROM:    Active ROM Right eval Left eval  Shoulder flexion 130 90  Shoulder abduction 128 88  Shoulder adduction    Shoulder extension    Shoulder internal rotation    Shoulder external rotation    Elbow flexion 140 140  Elbow extension 0 0  Wrist flexion    Wrist extension    Wrist ulnar deviation    Wrist radial deviation    Wrist pronation    Wrist supination    (Blank rows = not tested)  UPPER EXTREMITY MMT:     MMT Right eval Left eval  Shoulder flexion 4/5 3-/5  Shoulder abduction 4/5 3-/5  Shoulder adduction    Shoulder extension    Shoulder internal rotation    Shoulder external rotation    Middle trapezius    Lower trapezius    Elbow flexion 4/5 4/5  Elbow extension    Wrist flexion 4/5 4/5  Wrist extension 4/5 4/5  Wrist ulnar deviation    Wrist radial deviation    Wrist pronation    Wrist supination    (Blank rows = not tested)  HAND  FUNCTION: Grip strength: Right: 95 lbs; Left: 94 lbs, Lateral pinch: Right: 25 lbs, Left: 17 lbs, and 3 point pinch: Right: 17 lbs, Left: 20 lbs  COORDINATION: 9 Hole Peg test: Right: 28 sec; Left: 34 sec  SENSATION: WFL  EDEMA: none  COGNITION: Overall cognitive status: Within functional limits for tasks assessed, Pt reports difficulty at times recalling names.  Appears to demonstrate slower processing of new information and requires increased time for new learning.  VISION: Subjective report: Reports he had cararacts but were removed. Baseline vision: Wears glasses for reading only Visual history: cataracts  VISION ASSESSMENT: WFL  Patient has difficulty with following activities due to following visual impairments: needs glasses when viewing printed text  PERCEPTION: WFL   ADDITIONAL TESTING:   6 minute walk test 790 feet.  Observations:  Pt with decreased reciprocal arm swing, decreased step length-tends to have short, shuffled gait, can be easily distracted during task.  Forward flexed posture throughout test.   5 times sit to stand 14 secs BERG balance test: 43/56  TODAY'S TREATMENT:  DATE: 04/20/2022  Neuromuscular Reeducation:   Patient seen for instruction of LSVT BIG exercises: LSVT Daily Session Maximal Daily Exercises: Sustained movements are designed to rescale the amplitude of movement output for generalization to daily functional activities. Performed as follows for 1 set of 10 repetitions each: Multi directional sustained movements- 1) Floor to ceiling, 2) Side to side. Multi directional Repetitive movements performed in standing and are designed to provide retraining effort needed for sustained muscle activation in tasks Performed as follows: 3) Step and reach forward, 4) Step and Reach Backwards, 5) Step and reach sideways, 6) Rock and reach  forward/backward, 7) Rock and reach sideways. Sit to stand from mat table on lowest setting with cues for weight shift, technique and SBA for 10 reps for 1 set.     Exercises were performed this date in the standard version with min guard to min assist as needed for balance with stepping backwards and during rock and reach.  Posture exercises in standing with back against the wall, completing 5 reps of 1 min holds, manual cues for scapular retraction, head position and stretch to pectoralis muscles with place and hold.  Towel stretch for posture in supine, rolled towel and placed along spine, lying on back to provide increased stretch to pectoralis region for 3 mins.  ROM of shoulders with bilateral hands on ball and raising to shoulder height while having back on wall and squeezing shoulder blades together with upright posture.  Seated marching for 10 reps on each side.   7  hand exercises to help promote handwriting, dexterity and flexibility. 1)  Fisting with arm then arm extension with fingers extended with BIG hand movements 2) oppositional movements of the thumb to each digit 3) wrist flex/ext with elbows extended 4) supination/pronation 5) tendon gliding with hook fist and then finger extension 6) tendon gliding with tabletop movement of fingers with MP flexion, PIP and DIP extension 7) MP flexion, PIP flexion, DIP extension Performed with therapist demo and cues for form  Balance:   Activities in standing with weight shifts from right to left, attempts with standing on one leg, left more difficult than right.    Functional mobility:  Pt seen for functional mobility with BIG walking in hallway for 900 feet, min to moderate cues for amplitude of steps as well as reciprocal arm swing.  Pt demonstrates difficulty with relaxed arm swing with mobility and tends to hold arms more rigid.   Cues for posture.  Turns are slow during mobility.   Will plan to perform progress update with goal update  next session, will reassess 6 min walk, balance and 5 times sit to stand.    PATIENT EDUCATION: Education details: Maximal daily exercises Person educated: Patient and Spouse Education method: Explanation Education comprehension: verbalized understanding  HOME EXERCISE PROGRAM: Maximal daily exercises; seated marches  GOALS: Goals reviewed with patient? Yes  SHORT TERM GOALS: Target date: 04/20/2022  Pt to be able to perform buttons with improved speed and dexterity for dress shirt in less than 1 min. Baseline: increased time, up to 2 min to complete Goal status: INITIAL   LONG TERM GOALS: Target date: 05/16/2022  Patient will improve gait speed and endurance to be able to ambulate  feet in 6 minutes to negotiate around the home and community safely in 4 weeks.  Baseline: Eval-6 min walk test Goal status: INITIAL  2.  Patient will complete HEP for maximal daily exercises with modified independence in 4 weeks.  Baseline: no  current home program related to Parkinsonism symptoms. Goal status: INITIAL  3.  Patient will transfer from to sit to stand without the use of arms safely and independently from a variety of chairs/surfaces in 4 weeks.  Baseline: difficulty with sit to stand from lower surface heights and occasionally has to use arms to assist. Goal status: INITIAL  4.  Pt will score 74 or greater on FOTO to demonstrate a clinically relevant change to impact independence in ADL and IADL tasks at home and in the community.   Baseline: At eval FOTO: 72.88 Goal status: INITIAL  5.  Pt will improve BERG balance score by 5 points to demonstrate improved balance during functional mobility and during ADL and IADL to reduce fall risk.   Baseline: At eval score: 43 Goal status: INITIAL    ASSESSMENT:  CLINICAL IMPRESSION: Pt reports being consistent with performing exercises at home this week while wife has been out of town.  He is becoming more consistent with recalling  exercises and decreased need for cues for proper form.  He still requires cues for stepping to the side and tends to want to twist his trunk.  Decreased balance with rock and reach exercise in standing, especially when left foot is forward.  Will continue to add balance tasks into treatment next week.  Will plan to perform reassessments next session with goal update for Progress Update report.  Pt to continue with exercises at home, 2 times a day.    PERFORMANCE DEFICITS: in functional skills including IADLs, coordination, dexterity, proprioception, ROM, strength, flexibility, Fine motor control, Gross motor control, mobility, balance, endurance, and UE functional use, cognitive skills including attention and memory, and psychosocial skills including environmental adaptation, habits, and routines and behaviors.   IMPAIRMENTS: are limiting patient from ADLs, IADLs, leisure, and social participation.   CO-MORBIDITIES: may have co-morbidities  that affects occupational performance. Patient will benefit from skilled OT to address above impairments and improve overall function.  MODIFICATION OR ASSISTANCE TO COMPLETE EVALUATION: No modification of tasks or assist necessary to complete an evaluation.  OT OCCUPATIONAL PROFILE AND HISTORY: Detailed assessment: Review of records and additional review of physical, cognitive, psychosocial history related to current functional performance.  CLINICAL DECISION MAKING: Moderate - several treatment options, min-mod task modification necessary  REHAB POTENTIAL: Excellent  EVALUATION COMPLEXITY: Low  PLAN:  OT FREQUENCY: 4x/week  OT DURATION: 4 weeks  PLANNED INTERVENTIONS: self care/ADL training, therapeutic exercise, therapeutic activity, neuromuscular re-education, manual therapy, passive range of motion, gait training, balance training, functional mobility training, moist heat, and patient/family education  RECOMMENDED OTHER SERVICES: none at this  time  CONSULTED AND AGREED WITH PLAN OF CARE: Patient and family member/caregiver  PLAN FOR NEXT SESSION: Continue with instruction of LSVT BIG maximal daily exercises   Rumeal Cullipher T Reann Dobias, OTR/L, CLT  Kinsler Soeder, OT 04/25/2022, 8:06 PM

## 2022-04-20 NOTE — Therapy (Signed)
OUTPATIENT OCCUPATIONAL THERAPY NEURO TREATMENT  Patient Name: Pedro Swanson MRN: [id number] DOB:[birth year], 71 y.o., male Today's Date: 04/19/2022  PCP: Eulis Foster REFERRING PROVIDER: Jennings Books  END OF SESSION:  OT End of Session - 04/20/22 1050     Visit Number 8    Number of Visits 17    Date for OT Re-Evaluation 05/16/22    OT Start Time 1259    OT Stop Time 1400    OT Time Calculation (min) 61 min    Activity Tolerance Patient tolerated treatment well    Behavior During Therapy [facility] for tasks assessed/performed              Past Medical History:  Diagnosis Date   Cataract    COVID    Hyperlipidemia    Hypertension    Palpitations    Shingles    Past Surgical History:  Procedure Laterality Date   CATARACT EXTRACTION     right eye   Patient Active Problem List   Diagnosis Date Noted   OSA (obstructive sleep apnea) 12/14/2020   Paroxysmal atrial fibrillation ([facility]) 07/11/2020   Pain in joint of left shoulder 01/11/2020   Atherosclerosis of abdominal aorta ([place]) 05/04/2019   Arteriosclerotic cardiovascular disease (ASCVD) 07/08/2018   Coronary artery disease involving native coronary artery of native heart 10/02/2017   Benign fibroma of prostate 01/18/2015   Hypercholesteremia 01/18/2015   BP (high blood pressure) 01/18/2015   Osteoarthrosis 01/18/2015   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 01/18/2015   Avitaminosis D 01/18/2015    ONSET DATE: 12/04/2021  REFERRING DIAG: Parkinsonism   THERAPY DIAG:  Muscle weakness (generalized)  Other lack of coordination  Unsteadiness on feet  Abnormal posture  Difficulty in walking, not elsewhere classified  Rationale for Evaluation and Treatment: Rehabilitation  SUBJECTIVE:   SUBJECTIVE STATEMENT:  Pt reports his wife is out of town this week with her friends so he is on his own, has been making his own meals, still doing exercises and has gone to the [facility] in the mornings.  [place]   PERTINENT HISTORY: Pt seen by neurology this year for evalution for Parkinson like symptoms.  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  Possible neuropathy with paresthesia in bilateral feet with reduced intraepidermal nerve fiber density on distal right thigh found on syn-one test 01/19/2022. History of Obstructive sleep apnea on CPAP, REM behavior disorder, vitamin deficiency, Shingles, A fib.   PRECAUTIONS: Fall  WEIGHT BEARING RESTRICTIONS: No  PAIN:  Are you having pain? No  FALLS: Has patient fallen in last 6 months? Yes. Number of falls 1  LIVING ENVIRONMENT: Lives with: lives with their family and lives with their spouse Lives in: House/apartment Has following equipment at home: None  PLOF: Independent with basic ADLs  PATIENT GOALS: Pt reports he would like to improve his balance and reduce his risk of falls.     HAND DOMINANCE: Right  ADLs: Overall ADLs: modified independent Transfers/ambulation related to ADLs: modified independent, no assistive device at this time Eating: modified independent Grooming: modified  independent UB Dressing: modified independent, increased time for buttons, snaps and zippers LB Dressing: modified independent but requires increased time to complete tasks compared to a year ago Toileting: modified independent Bathing: modified independent Tub Shower transfers: modified independent Equipment: none    IADLs: Shopping: able to complete small  purchases, wife does most of the shopping Light housekeeping: able to help around the home with light tasks but wife does most of housekeeping chores. Meal Prep: modified independent Yardwork:  Pt reports requiring increased assistance for yardwork over time, they now moved to a townhome so he does not have to perform these tasks.   Community mobility: modified independent but reports slower with ambulation in recent months. He is still able to drive.  Medication management: Pt requires reminders for medication  Financial management: modified independent Handwriting: 75% legible Leisure tasks include:  Music, painting and sailing, pt reports he is not able to participate in many of these tasks as he did in the past, especially with sailing and decreased balance.  MOBILITY STATUS: Hx of falls  POSTURE COMMENTS:  rounded shoulders, forwards flexed posture Sitting balance: Sits without UE support up to 30 sec  ACTIVITY TOLERANCE: Activity tolerance: Pt reports increased time to perform tasks, will take rest breaks as needed.  FUNCTIONAL OUTCOME MEASURES: FOTO: 72.88  UPPER EXTREMITY ROM:    Active ROM Right eval Left eval  Shoulder flexion 130 90  Shoulder abduction 128 88  Shoulder adduction    Shoulder extension    Shoulder internal rotation    Shoulder external rotation    Elbow flexion 140 140  Elbow extension 0 0  Wrist flexion    Wrist extension    Wrist ulnar deviation    Wrist radial deviation    Wrist pronation    Wrist supination    (Blank rows = not tested)  UPPER EXTREMITY MMT:     MMT Right eval Left eval  Shoulder flexion 4/5 3-/5  Shoulder abduction 4/5 3-/5  Shoulder adduction    Shoulder extension    Shoulder internal rotation    Shoulder external rotation    Middle trapezius    Lower trapezius    Elbow flexion 4/5 4/5  Elbow extension    Wrist flexion 4/5 4/5  Wrist extension 4/5 4/5  Wrist ulnar deviation    Wrist radial deviation    Wrist  pronation    Wrist supination    (Blank rows = not tested)  HAND FUNCTION: Grip strength: Right: 95 lbs; Left: 94 lbs, Lateral pinch: Right: 25 lbs, Left: 17 lbs, and 3 point pinch: Right: 17 lbs, Left: 20 lbs  COORDINATION: 9 Hole Peg test: Right: 28 sec; Left: 34 sec  SENSATION: WFL  EDEMA: none  COGNITION: Overall cognitive status: Within functional limits for tasks assessed, Pt reports difficulty at times recalling names.  Appears to demonstrate slower processing of new information and requires increased time for new learning.  VISION: Subjective report: Reports he had cararacts but were removed. Baseline vision: Wears glasses for reading only Visual history: cataracts  VISION ASSESSMENT: WFL  Patient has difficulty with following activities due to following visual impairments: needs glasses when viewing printed text  PERCEPTION: WFL   ADDITIONAL TESTING:   6 minute walk test 790 feet.  Observations:  Pt with decreased reciprocal arm swing, decreased step length-tends to have short, shuffled gait, can be easily distracted during task.  Forward flexed posture throughout test.   5 times sit to stand 14 secs BERG balance  test: 43/56  TODAY'S TREATMENT:                                                                                                                              DATE: 04/19/2022  Neuromuscular Reeducation:   Patient seen for instruction of LSVT BIG exercises: LSVT Daily Session Maximal Daily Exercises: Sustained movements are designed to rescale the amplitude of movement output for generalization to daily functional activities. Performed as follows for 1 set of 10 repetitions each: Multi directional sustained movements- 1) Floor to ceiling, 2) Side to side. Multi directional Repetitive movements performed in standing and are designed to provide retraining effort needed for sustained muscle activation in tasks Performed as follows: 3) Step and reach forward, 4)  Step and Reach Backwards, 5) Step and reach sideways, 6) Rock and reach forward/backward, 7) Rock and reach sideways. Sit to stand from mat table on lowest setting with cues for weight shift, technique and SBA for 10 reps for 1 set.     Exercises were performed this date in the standard version with min guard to min assist as needed for balance with stepping backwards and during rock and reach.  Posture exercises in standing with back against the wall, completing 5 reps of 1 min holds, manual cues for scapular retraction, head position and stretch to pectoralis muscles with place and hold.  Towel stretch for posture in supine, rolled towel and placed along spine, lying on back to provide increased stretch to pectoralis region for 5 mins.  Reciprocal stepping and reciprocal arm tasks with min cues Seated marching for 10 reps on each side.   Functional mobility:  Pt seen for functional mobility with BIG walking in hallway for 850 feet, moderate cues for amplitude of steps as well as reciprocal arm swing.  When patient is distracted by conversation, he tends to revert back to shorter, shuffling steps or stops to finish conversation.  Pt also requires cues for posture during mobility for a more upright stance.     PATIENT EDUCATION: Education details: Maximal daily exercises Person educated: Patient and Spouse Education method: Explanation Education comprehension: verbalized understanding  HOME EXERCISE PROGRAM: Maximal daily exercises; seated marches  GOALS: Goals reviewed with patient? Yes  SHORT TERM GOALS: Target date: 04/20/2022  Pt to be able to perform buttons with improved speed and dexterity for dress shirt in less than 1 min. Baseline: increased time, up to 2 min to complete Goal status: INITIAL   LONG TERM GOALS: Target date: 05/16/2022  Patient will improve gait speed and endurance to be able to ambulate  feet in 6 minutes to negotiate around the home and community safely in 4  weeks.  Baseline: Eval-6 min walk test Goal status: INITIAL  2.  Patient will complete HEP for maximal daily exercises with modified independence in 4 weeks.  Baseline: no current home program related to Parkinsonism symptoms. Goal status: INITIAL  3.  Patient will transfer from to  sit to stand without the use of arms safely and independently from a variety of chairs/surfaces in 4 weeks.  Baseline: difficulty with sit to stand from lower surface heights and occasionally has to use arms to assist. Goal status: INITIAL  4.  Pt will score 74 or greater on FOTO to demonstrate a clinically relevant change to impact independence in ADL and IADL tasks at home and in the community.   Baseline: At eval FOTO: 72.88 Goal status: INITIAL  5.  Pt will improve BERG balance score by 5 points to demonstrate improved balance during functional mobility and during ADL and IADL to reduce fall risk.   Baseline: At eval score: 43 Goal status: INITIAL    ASSESSMENT:  CLINICAL IMPRESSION: Pt continues to demonstrate improvements in maximal daily exercises with form and technique.  Balance remains impaired and needed min assist balance recovery with 2 exercises in standing.  Continues to require cues for posture during all tasks but is demonstrating increased awareness and self correction at times.  Pt continues to demonstrate short shuffling steps with gait but improves with therapist modeling, shaping and cues for greater amplitude of movement patterns.  Will plan to add hand exercises next date and focus on additional component tasks.      PERFORMANCE DEFICITS: in functional skills including IADLs, coordination, dexterity, proprioception, ROM, strength, flexibility, Fine motor control, Gross motor control, mobility, balance, endurance, and UE functional use, cognitive skills including attention and memory, and psychosocial skills including environmental adaptation, habits, and routines and behaviors.    IMPAIRMENTS: are limiting patient from ADLs, IADLs, leisure, and social participation.   CO-MORBIDITIES: may have co-morbidities  that affects occupational performance. Patient will benefit from skilled OT to address above impairments and improve overall function.  MODIFICATION OR ASSISTANCE TO COMPLETE EVALUATION: No modification of tasks or assist necessary to complete an evaluation.  OT OCCUPATIONAL PROFILE AND HISTORY: Detailed assessment: Review of records and additional review of physical, cognitive, psychosocial history related to current functional performance.  CLINICAL DECISION MAKING: Moderate - several treatment options, min-mod task modification necessary  REHAB POTENTIAL: Excellent  EVALUATION COMPLEXITY: Low  PLAN:  OT FREQUENCY: 4x/week  OT DURATION: 4 weeks  PLANNED INTERVENTIONS: self care/ADL training, therapeutic exercise, therapeutic activity, neuromuscular re-education, manual therapy, passive range of motion, gait training, balance training, functional mobility training, moist heat, and patient/family education  RECOMMENDED OTHER SERVICES: none at this time  CONSULTED AND AGREED WITH PLAN OF CARE: Patient and family member/caregiver  PLAN FOR NEXT SESSION: Continue with instruction of LSVT BIG maximal daily exercises   Gatha Mcnulty T Unity Luepke, OTR/L, CLT  Raevon Broom, OT 04/20/2022, 10:51 AM

## 2022-04-23 ENCOUNTER — Ambulatory Visit: Payer: Medicare HMO | Admitting: Occupational Therapy

## 2022-04-23 DIAGNOSIS — R2681 Unsteadiness on feet: Secondary | ICD-10-CM

## 2022-04-23 DIAGNOSIS — R278 Other lack of coordination: Secondary | ICD-10-CM

## 2022-04-23 DIAGNOSIS — M6281 Muscle weakness (generalized): Secondary | ICD-10-CM

## 2022-04-23 DIAGNOSIS — R262 Difficulty in walking, not elsewhere classified: Secondary | ICD-10-CM

## 2022-04-23 DIAGNOSIS — R293 Abnormal posture: Secondary | ICD-10-CM

## 2022-04-24 ENCOUNTER — Ambulatory Visit: Payer: Medicare HMO | Admitting: Occupational Therapy

## 2022-04-24 DIAGNOSIS — R278 Other lack of coordination: Secondary | ICD-10-CM

## 2022-04-24 DIAGNOSIS — R262 Difficulty in walking, not elsewhere classified: Secondary | ICD-10-CM

## 2022-04-24 DIAGNOSIS — M6281 Muscle weakness (generalized): Secondary | ICD-10-CM | POA: Diagnosis not present

## 2022-04-24 DIAGNOSIS — R293 Abnormal posture: Secondary | ICD-10-CM

## 2022-04-24 DIAGNOSIS — R2681 Unsteadiness on feet: Secondary | ICD-10-CM

## 2022-04-25 ENCOUNTER — Ambulatory Visit: Payer: Medicare HMO | Admitting: Occupational Therapy

## 2022-04-25 DIAGNOSIS — R262 Difficulty in walking, not elsewhere classified: Secondary | ICD-10-CM

## 2022-04-25 DIAGNOSIS — M6281 Muscle weakness (generalized): Secondary | ICD-10-CM

## 2022-04-25 DIAGNOSIS — R293 Abnormal posture: Secondary | ICD-10-CM

## 2022-04-25 DIAGNOSIS — R278 Other lack of coordination: Secondary | ICD-10-CM

## 2022-04-25 DIAGNOSIS — R2681 Unsteadiness on feet: Secondary | ICD-10-CM

## 2022-04-26 ENCOUNTER — Encounter: Payer: Self-pay | Admitting: Occupational Therapy

## 2022-04-26 ENCOUNTER — Ambulatory Visit: Payer: Medicare HMO | Admitting: Occupational Therapy

## 2022-04-26 DIAGNOSIS — M6281 Muscle weakness (generalized): Secondary | ICD-10-CM | POA: Diagnosis not present

## 2022-04-26 DIAGNOSIS — R278 Other lack of coordination: Secondary | ICD-10-CM

## 2022-04-26 DIAGNOSIS — R262 Difficulty in walking, not elsewhere classified: Secondary | ICD-10-CM

## 2022-04-26 DIAGNOSIS — R293 Abnormal posture: Secondary | ICD-10-CM

## 2022-04-26 DIAGNOSIS — R2681 Unsteadiness on feet: Secondary | ICD-10-CM

## 2022-04-26 NOTE — Therapy (Signed)
OUTPATIENT OCCUPATIONAL THERAPY NEURO TREATMENT  Patient Name: Pedro Swanson MRN: [id number] DOB:10/30/1950, 71 y.o., male Today's Date: 04/24/2022  PCP: Eulis Foster REFERRING PROVIDER: Jennings Books  END OF SESSION:  OT End of Session - 04/26/22 1655     Visit Number 11    Number of Visits 17    Date for OT Re-Evaluation 05/16/22    OT Start Time 1000    OT Stop Time 1100    OT Time Calculation (min) 60 min    Activity Tolerance Patient tolerated treatment well    Behavior During Therapy [facility] for tasks assessed/performed              Past Medical History:  Diagnosis Date   Cataract    COVID    Hyperlipidemia    Hypertension    Palpitations    Shingles    Past Surgical History:  Procedure Laterality Date   CATARACT EXTRACTION     right eye   Patient Active Problem List   Diagnosis Date Noted   OSA (obstructive sleep apnea) 12/14/2020   Paroxysmal atrial fibrillation ([place]) 07/11/2020   Pain in joint of left shoulder 01/11/2020   Atherosclerosis of abdominal aorta ([place]) 05/04/2019   Arteriosclerotic cardiovascular disease (ASCVD) 07/08/2018   Coronary artery disease involving native coronary artery of native heart 10/02/2017   Benign fibroma of prostate 01/18/2015   Hypercholesteremia 01/18/2015   BP (high blood pressure) 01/18/2015   Osteoarthrosis 01/18/2015   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  Pt reports he did exercises another time yesterday to get in his 2 sessions, reports he feels he  is improving.    PERTINENT HISTORY: Pt seen by neurology this year for evalution for Parkinson like symptoms.  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  Possible neuropathy with paresthesia in bilateral feet with reduced intraepidermal nerve fiber density on distal right thigh found on syn-one test 01/19/2022. History of Obstructive sleep apnea on CPAP, REM behavior disorder, vitamin deficiency, Shingles, A fib.   PRECAUTIONS: Fall  WEIGHT BEARING RESTRICTIONS: No  PAIN:  Are you having pain? No  FALLS: Has patient fallen in last 6 months? Yes. Number of falls 1  LIVING ENVIRONMENT: Lives with: lives with their family and lives with their spouse Lives in: House/apartment Has following equipment at home: None  PLOF: Independent with basic ADLs  PATIENT GOALS: Pt reports he would like to improve his balance and reduce his risk of falls.     HAND DOMINANCE: Right  ADLs: Overall ADLs: modified independent Transfers/ambulation related to ADLs: modified independent, no assistive device at this time Eating: modified independent Grooming: modified independent UB Dressing: modified independent, increased time for buttons, snaps and zippers  LB Dressing: modified independent but requires increased time to complete tasks compared to a year ago Toileting: modified independent Bathing: modified independent Tub Shower transfers: modified independent Equipment: none    IADLs: Shopping: able to complete small purchases, wife does most  of the shopping Light housekeeping: able to help around the home with light tasks but wife does most of housekeeping chores. Meal Prep: modified independent Yardwork:  Pt reports requiring increased assistance for yardwork over time, they now moved to a townhome so he does not have to perform these tasks.   Community mobility: modified independent but reports slower with ambulation in recent months. He is still able to drive.  Medication management: Pt requires reminders for medication  Financial management: modified independent Handwriting: 75% legible Leisure tasks include:  Music, painting and sailing, pt reports he is not able to participate in many of these tasks as he did in the past, especially with sailing and decreased balance.  MOBILITY STATUS: Hx of falls  POSTURE COMMENTS:  rounded shoulders, forwards flexed posture Sitting balance: Sits without UE support up to 30 sec  ACTIVITY TOLERANCE: Activity tolerance: Pt reports increased time to perform tasks, will take rest breaks as needed.  FUNCTIONAL OUTCOME MEASURES: FOTO: 72.88  UPPER EXTREMITY ROM:    Active ROM Right eval Left eval  Shoulder flexion 130 90  Shoulder abduction 128 88  Shoulder adduction    Shoulder extension    Shoulder internal rotation    Shoulder external rotation    Elbow flexion 140 140  Elbow extension 0 0  Wrist flexion    Wrist extension    Wrist ulnar deviation    Wrist radial deviation    Wrist pronation    Wrist supination    (Blank rows = not tested)  UPPER EXTREMITY MMT:     MMT Right eval Left eval  Shoulder flexion 4/5 3-/5  Shoulder abduction 4/5 3-/5  Shoulder adduction    Shoulder extension    Shoulder internal rotation    Shoulder external rotation    Middle trapezius    Lower trapezius    Elbow flexion 4/5 4/5  Elbow extension    Wrist flexion 4/5 4/5  Wrist extension 4/5 4/5  Wrist ulnar deviation    Wrist radial deviation    Wrist pronation    Wrist  supination    (Blank rows = not tested)  HAND FUNCTION: Grip strength: Right: 95 lbs; Left: 94 lbs, Lateral pinch: Right: 25 lbs, Left: 17 lbs, and 3 point pinch: Right: 17 lbs, Left: 20 lbs  COORDINATION: 9 Hole Peg test: Right: 28 sec; Left: 34 sec  SENSATION: WFL  EDEMA: none  COGNITION: Overall cognitive status: Within functional limits for tasks assessed, Pt reports difficulty at times recalling names.  Appears to demonstrate slower processing of new information and requires increased time for new learning.  VISION: Subjective report: Reports he had cararacts but were removed. Baseline vision: Wears glasses for reading only Visual history: cataracts  VISION ASSESSMENT: WFL  Patient has difficulty with following activities due to following visual impairments: needs glasses when viewing printed text  PERCEPTION: WFL   ADDITIONAL TESTING at EVAL:   6 minute walk test 790 feet. 10th visit: 995 feet  Observations:  Pt with decreased reciprocal arm swing, decreased step length-tends to have short, shuffled gait, can be easily distracted during task.  Forward flexed posture throughout test.   5 times sit to stand 14 secs, 10 visit: 14 secs.  BERG balance test:  43/56, 10th visit: 75  TODAY'S TREATMENT:                                                                                                                              DATE: 04/24/2022  Neuromuscular Reeducation:   Patient seen for instruction of LSVT BIG exercises: LSVT Daily Session Maximal Daily Exercises: Sustained movements are designed to rescale the amplitude of movement output for generalization to daily functional activities. Performed as follows for 1 set of 10 repetitions each: Multi directional sustained movements- 1) Floor to ceiling, 2) Side to side. Multi directional Repetitive movements performed in standing and are designed to provide retraining effort needed for sustained muscle activation in tasks  Performed as follows: 3) Step and reach forward, 4) Step and Reach Backwards, 5) Step and reach sideways, 6) Rock and reach forward/backward, 7) Rock and reach sideways. Sit to stand from mat table on lowest setting with cues for weight shift, technique for 10 reps for 1 set.     Exercises were performed this date in the standard version with min guard as needed for balance with stepping backwards and during rock and reach.  Pt able to recall and perform side to side exercise correctly this date without therapist providing cues.  Posture exercises in standing with back against the wall, completing 5 reps of 1 min holds, manual cues for scapular retraction, head position and stretch to pectoralis muscles with place and hold. Pt able to demonstrate good form with this exercise even with distractions in place.   Seated marching for 10 reps on each side   7  hand exercises to help promote handwriting, dexterity and flexibility. 1)  Fisting with arm then arm extension with fingers extended with BIG hand movements 2) oppositional movements of the thumb to each digit 3) wrist flex/ext with elbows extended 4) supination/pronation 5) tendon gliding with hook fist and then finger extension 6) tendon gliding with tabletop movement of fingers with MP flexion, PIP and DIP extension 7) MP flexion, PIP flexion, DIP extension Performed with therapist demo and cues for form, pt able to recall 1/2 of hand exercises   Balance:   Activities in standing with weight shifts from right to left.  Focused on single leg stance this date with min guard to min assist, able to demonstrate for 5-7 secs on right and 4-5 on the left.   Tandem stance for 5 reps on each leg, more difficulty with left than right, modified task as needed with placement of feet and progressing towards full tandem stance.    Functional mobility:  Pt seen for functional mobility for 750 feet this date with cues for amplitude of steps, posture when  distracted and reciprocal arm swing occasionally.  Slow with turns and often pauses with turn before initiating stepping pattern again.  Performed this date with and without distractions during mobility.    PATIENT EDUCATION: Education details: Maximal daily exercises Person educated: Patient and  Spouse Education method: Explanation Education comprehension: verbalized understanding  HOME EXERCISE PROGRAM: Maximal daily exercises; seated marches  GOALS: Goals reviewed with patient? Yes  SHORT TERM GOALS: Target date: 04/20/2022  Pt to be able to perform buttons with improved speed and dexterity for dress shirt in less than 1 min. Baseline: increased time, up to 2 min to complete, 10TH VISIT: less than 2 mins but goal not met yet Goal status: PROGRESSING   LONG TERM GOALS: Target date: 05/16/2022  Patient will improve gait speed and endurance to be able to ambulate 1050 feet in 6 minutes to negotiate around the home and community safely in 4 weeks.  Baseline: Eval-6 min walk test 790, 10TH VISIT: 995  Goal status: PROGRESSING  2.  Patient will complete HEP for maximal daily exercises with modified independence in 4 weeks.  Baseline: no current home program related to Parkinsonism symptoms, 10TH VISIT: progressing well, continues to require cues for select exercises and min guard Goal status: PROGRESSING  3.  Patient will transfer from to sit to stand without the use of arms safely and independently from a variety of chairs/surfaces in 4 weeks.  Baseline: difficulty with sit to stand from lower surface heights and occasionally has to use arms to assist. Goal status: MET  4.  Pt will score 74 or greater on FOTO to demonstrate a clinically relevant change to impact independence in ADL and IADL tasks at home and in the community.   Baseline: At eval FOTO: 80 Goal status: MET  5.  Pt will improve BERG balance score by 5 points to demonstrate improved balance during functional  mobility and during ADL and IADL to reduce fall risk.   Baseline: At eval score: 43, 10th visit:  46 Goal status: PROGRESSING    ASSESSMENT:  CLINICAL IMPRESSION: Patient requiring less cues for maximal daily exercises this week. Able to perform sideways stepping pattern correctly this date. Patient continues to demonstrate decreased balance at times, especially with rock and reach and occasionally with stepping backwards. Added additional balance activities this week, including single leg stance, and tandem stance. Patient able to complete with min guard and able to demonstrate 4-7 seconds. Patient continues to require cues for amplitude of steps with functional mobility.  Continue to work towards goals and plan of care to improve balance, range of motion, strength, and facilitate greater independence in daily tasks.     PERFORMANCE DEFICITS: in functional skills including IADLs, coordination, dexterity, proprioception, ROM, strength, flexibility, Fine motor control, Gross motor control, mobility, balance, endurance, and UE functional use, cognitive skills including attention and memory, and psychosocial skills including environmental adaptation, habits, and routines and behaviors.   IMPAIRMENTS: are limiting patient from ADLs, IADLs, leisure, and social participation.   CO-MORBIDITIES: may have co-morbidities  that affects occupational performance. Patient will benefit from skilled OT to address above impairments and improve overall function.  MODIFICATION OR ASSISTANCE TO COMPLETE EVALUATION: No modification of tasks or assist necessary to complete an evaluation.  OT OCCUPATIONAL PROFILE AND HISTORY: Detailed assessment: Review of records and additional review of physical, cognitive, psychosocial history related to current functional performance.  CLINICAL DECISION MAKING: Moderate - several treatment options, min-mod task modification necessary  REHAB POTENTIAL: Excellent  EVALUATION  COMPLEXITY: Low  PLAN:  OT FREQUENCY: 4x/week  OT DURATION: 4 weeks  PLANNED INTERVENTIONS: self care/ADL training, therapeutic exercise, therapeutic activity, neuromuscular re-education, manual therapy, passive range of motion, gait training, balance training, functional mobility training, moist heat, and patient/family education  RECOMMENDED OTHER SERVICES: none at this time  CONSULTED AND AGREED WITH PLAN OF CARE: Patient and family member/caregiver  PLAN FOR NEXT SESSION: Continue with instruction of LSVT BIG maximal daily exercises, balance, ROM, amplitude of movement   [place] Enke T Ceazia Harb, OTR/L, CLT  Donie Lemelin, OT 04/26/2022, 4:57 PM

## 2022-04-26 NOTE — Therapy (Signed)
OUTPATIENT OCCUPATIONAL THERAPY NEURO TREATMENT/PROGRESS UPDATE REPORTING PERIOD FROM 04/04/2022 TO 04/23/2022  Patient Name: Pedro Swanson MRN: [id number] DOB:01-30-1951, 71 y.o., male Today's Date: 04/23/2022  PCP: Eulis Foster REFERRING PROVIDER: Jennings Books  END OF SESSION:  OT End of Session - 04/26/22 1137     Visit Number 10    Number of Visits 17    Date for OT Re-Evaluation 05/16/22    OT Start Time 1300    OT Stop Time 1401    OT Time Calculation (min) 61 min    Activity Tolerance Patient tolerated treatment well    Behavior During Therapy [facility] for tasks assessed/performed              Past Medical History:  Diagnosis Date   Cataract    COVID    Hyperlipidemia    Hypertension    Palpitations    Shingles    Past Surgical History:  Procedure Laterality Date   CATARACT EXTRACTION     right eye   Patient Active Problem List   Diagnosis Date Noted   OSA (obstructive sleep apnea) 12/14/2020   Paroxysmal atrial fibrillation ([place]) 07/11/2020   Pain in joint of left shoulder 01/11/2020   Atherosclerosis of abdominal aorta ([place]) 05/04/2019   Arteriosclerotic cardiovascular disease (ASCVD) 07/08/2018   Coronary artery disease involving native coronary artery of native heart 10/02/2017   Benign fibroma of prostate 01/18/2015   Hypercholesteremia 01/18/2015   BP (high blood pressure) 01/18/2015   Osteoarthrosis 01/18/2015   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 01/18/2015   Avitaminosis D 01/18/2015    ONSET DATE: 12/04/2021  REFERRING DIAG: Parkinsonism   THERAPY DIAG:  Muscle weakness (generalized)  Other lack of coordination  Unsteadiness on feet  Abnormal posture  Difficulty in walking, not elsewhere classified  Rationale for Evaluation and Treatment: Rehabilitation  SUBJECTIVE:   SUBJECTIVE STATEMENT:  Pt reports his wife is still out of town until probably Sunday, he has been preparing food himself at home and going to the  [facility] daily for exercise.    PERTINENT HISTORY: Pt seen by neurology this year for evalution for Parkinson like symptoms.  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  Possible neuropathy with paresthesia in bilateral feet with reduced intraepidermal nerve fiber density on distal right thigh found on syn-one test 01/19/2022. History of Obstructive sleep apnea on CPAP, REM behavior disorder, vitamin deficiency, Shingles, A fib.   PRECAUTIONS: Fall  WEIGHT BEARING RESTRICTIONS: No  PAIN:  Are you having pain? No  FALLS: Has patient fallen in last 6 months? Yes. Number of falls 1  LIVING ENVIRONMENT: Lives with: lives with their family and lives with their spouse Lives in: House/apartment Has following equipment at home: None  PLOF: Independent with basic ADLs  PATIENT GOALS: Pt reports he would like to improve his balance and reduce his risk of falls.     HAND DOMINANCE: Right  ADLs: Overall ADLs: modified independent Transfers/ambulation related to ADLs: modified independent, no assistive device at this time Eating: modified independent Grooming: modified independent UB Dressing: modified independent, increased time for buttons, snaps and zippers LB Dressing:  modified independent but requires increased time to complete tasks compared to a year ago Toileting: modified independent Bathing: modified independent Tub Shower transfers: modified independent Equipment: none    IADLs: Shopping: able to complete small purchases, wife  does most of the shopping Light housekeeping: able to help around the home with light tasks but wife does most of housekeeping chores. Meal Prep: modified independent Yardwork:  Pt reports requiring increased assistance for yardwork over time, they now moved to a townhome so he does not have to perform these tasks.   Community mobility: modified independent but reports slower with ambulation in recent months. He is still able to drive.  Medication management: Pt requires reminders for medication  Financial management: modified independent Handwriting: 75% legible Leisure tasks include:  Music, painting and sailing, pt reports he is not able to participate in many of these tasks as he did in the past, especially with sailing and decreased balance.  MOBILITY STATUS: Hx of falls  POSTURE COMMENTS:  rounded shoulders, forwards flexed posture Sitting balance: Sits without UE support up to 30 sec  ACTIVITY TOLERANCE: Activity tolerance: Pt reports increased time to perform tasks, will take rest breaks as needed.  FUNCTIONAL OUTCOME MEASURES: FOTO: 72.88  UPPER EXTREMITY ROM:    Active ROM Right eval Left eval  Shoulder flexion 130 90  Shoulder abduction 128 88  Shoulder adduction    Shoulder extension    Shoulder internal rotation    Shoulder external rotation    Elbow flexion 140 140  Elbow extension 0 0  Wrist flexion    Wrist extension    Wrist ulnar deviation    Wrist radial deviation    Wrist pronation    Wrist supination    (Blank rows = not tested)  UPPER EXTREMITY MMT:     MMT Right eval Left eval  Shoulder flexion 4/5 3-/5  Shoulder abduction 4/5 3-/5  Shoulder adduction    Shoulder extension    Shoulder internal rotation    Shoulder external rotation    Middle trapezius    Lower trapezius    Elbow flexion 4/5 4/5  Elbow extension    Wrist flexion 4/5 4/5  Wrist extension 4/5 4/5  Wrist ulnar deviation    Wrist radial deviation    Wrist pronation     Wrist supination    (Blank rows = not tested)  HAND FUNCTION: Grip strength: Right: 95 lbs; Left: 94 lbs, Lateral pinch: Right: 25 lbs, Left: 17 lbs, and 3 point pinch: Right: 17 lbs, Left: 20 lbs  COORDINATION: 9 Hole Peg test: Right: 28 sec; Left: 34 sec  SENSATION: WFL  EDEMA: none  COGNITION: Overall cognitive status: Within functional limits for tasks assessed, Pt reports difficulty at times recalling names.  Appears to demonstrate slower processing of new information and requires increased time for new learning.  VISION: Subjective report: Reports he had cararacts but were removed. Baseline vision: Wears glasses for reading only Visual history: cataracts  VISION ASSESSMENT: WFL  Patient has difficulty with following activities due to following visual impairments: needs glasses when viewing printed text  PERCEPTION: WFL   ADDITIONAL TESTING at EVAL:   6 minute walk test 790 feet. 10th visit: 995 feet  Observations:  Pt with decreased reciprocal arm swing, decreased step length-tends to have short, shuffled gait, can be easily distracted during task.  Forward flexed posture throughout test.   5 times sit to stand 14 secs, 10 visit: 14 secs.  BERG balance test: 43/56, 10th  visit: 56  TODAY'S TREATMENT:                                                                                                                              DATE: 04/23/2022  Neuromuscular Reeducation:   Patient seen for instruction of LSVT BIG exercises: LSVT Daily Session Maximal Daily Exercises: Sustained movements are designed to rescale the amplitude of movement output for generalization to daily functional activities. Performed as follows for 1 set of 10 repetitions each: Multi directional sustained movements- 1) Floor to ceiling, 2) Side to side. Multi directional Repetitive movements performed in standing and are designed to provide retraining effort needed for sustained muscle activation in tasks  Performed as follows: 3) Step and reach forward, 4) Step and Reach Backwards, 5) Step and reach sideways, 6) Rock and reach forward/backward, 7) Rock and reach sideways. Sit to stand from mat table on lowest setting with cues for weight shift, technique and SBA for 10 reps for 1 set.     Exercises were performed this date in the standard version with min guard to min assist as needed for balance with stepping backwards and during rock and reach.  Posture exercises in standing with back against the wall, completing 5 reps of 1 min holds, manual cues for scapular retraction, head position and stretch to pectoralis muscles with place and hold.   ROM of shoulders with bilateral hands on ball and raising to shoulder height while having back on wall and squeezing shoulder blades together with upright posture.  Seated marching for 10 reps on each side.   7  hand exercises to help promote handwriting, dexterity and flexibility. 1)  Fisting with arm then arm extension with fingers extended with BIG hand movements 2) oppositional movements of the thumb to each digit 3) wrist flex/ext with elbows extended 4) supination/pronation 5) tendon gliding with hook fist and then finger extension 6) tendon gliding with tabletop movement of fingers with MP flexion, PIP and DIP extension 7) MP flexion, PIP flexion, DIP extension Performed with therapist demo and cues for form, pt able to recall 1/2 of hand exercises   Balance:   Activities in standing with weight shifts from right to left.  Focused on single leg stance this date with min guard to min assist. Tandem stance for 5 reps on each leg, more difficulty with left than right.    Functional mobility:  6 min walk test 995 feet 5 times sit to stand 14 secs BERG score 46   PATIENT EDUCATION: Education details: Maximal daily exercises Person educated: Patient and Spouse Education method: Explanation Education comprehension: verbalized understanding  HOME  EXERCISE PROGRAM: Maximal daily exercises; seated marches  GOALS: Goals reviewed with patient? Yes  SHORT TERM GOALS: Target date: 04/20/2022  Pt to be able to perform buttons with improved speed and dexterity for dress shirt in less than 1 min. Baseline: increased time, up to 2 min to complete, 10TH VISIT: less  than 2 mins but goal not met yet Goal status: PROGRESSING   LONG TERM GOALS: Target date: 05/16/2022  Patient will improve gait speed and endurance to be able to ambulate 1050 feet in 6 minutes to negotiate around the home and community safely in 4 weeks.  Baseline: Eval-6 min walk test 790, 10TH VISIT: 995  Goal status: PROGRESSING  2.  Patient will complete HEP for maximal daily exercises with modified independence in 4 weeks.  Baseline: no current home program related to Parkinsonism symptoms, 10TH VISIT: progressing well, continues to require cues for select exercises and min guard Goal status: PROGRESSING  3.  Patient will transfer from to sit to stand without the use of arms safely and independently from a variety of chairs/surfaces in 4 weeks.  Baseline: difficulty with sit to stand from lower surface heights and occasionally has to use arms to assist. Goal status: MET  4.  Pt will score 74 or greater on FOTO to demonstrate a clinically relevant change to impact independence in ADL and IADL tasks at home and in the community.   Baseline: At eval FOTO: 80 Goal status: MET  5.  Pt will improve BERG balance score by 5 points to demonstrate improved balance during functional mobility and during ADL and IADL to reduce fall risk.   Baseline: At eval score: 43, 10th visit:  46 Goal status: PROGRESSING    ASSESSMENT:  CLINICAL IMPRESSION: Pt has continued to make good progress in all areas.  His 6 min walk test improved from 790 feet to 995 feet with greater amplitude of movement, speed, reciprocal arm swing improved with effort to focus on this area.  He still tends  to display a forwards flexed posture and especially with distractions.  Short, shuffling gait continues at times but is more aware, responds well to cues and is starting to demonstrate initiation of correcting this while performing functional mobility.  Pt is doing well with sit to stand from a variety of surface heights.  Balance is slowly improving and is able to demonstrate balance with a single leg stance for up to 7 secs at a time and tandem stance for 5 secs. Pt continues to benefit from skilled OT treatment with implementation of LSVT BIG protocol.  Will continue to work towards improving posture, amplitude of movement and balance for necessary daily tasks.     PERFORMANCE DEFICITS: in functional skills including IADLs, coordination, dexterity, proprioception, ROM, strength, flexibility, Fine motor control, Gross motor control, mobility, balance, endurance, and UE functional use, cognitive skills including attention and memory, and psychosocial skills including environmental adaptation, habits, and routines and behaviors.   IMPAIRMENTS: are limiting patient from ADLs, IADLs, leisure, and social participation.   CO-MORBIDITIES: may have co-morbidities  that affects occupational performance. Patient will benefit from skilled OT to address above impairments and improve overall function.  MODIFICATION OR ASSISTANCE TO COMPLETE EVALUATION: No modification of tasks or assist necessary to complete an evaluation.  OT OCCUPATIONAL PROFILE AND HISTORY: Detailed assessment: Review of records and additional review of physical, cognitive, psychosocial history related to current functional performance.  CLINICAL DECISION MAKING: Moderate - several treatment options, min-mod task modification necessary  REHAB POTENTIAL: Excellent  EVALUATION COMPLEXITY: Low  PLAN:  OT FREQUENCY: 4x/week  OT DURATION: 4 weeks  PLANNED INTERVENTIONS: self care/ADL training, therapeutic exercise, therapeutic activity,  neuromuscular re-education, manual therapy, passive range of motion, gait training, balance training, functional mobility training, moist heat, and patient/family education  RECOMMENDED OTHER SERVICES: none at  this time  CONSULTED AND AGREED WITH PLAN OF CARE: Patient and family member/caregiver  PLAN FOR NEXT SESSION: Continue with instruction of LSVT BIG maximal daily exercises   Marlyne Totaro T Lendy Dittrich, OTR/L, CLT  Lilyanne Mcquown, OT 04/26/2022, 11:38 AM

## 2022-04-28 ENCOUNTER — Encounter: Payer: Self-pay | Admitting: Occupational Therapy

## 2022-04-28 NOTE — Therapy (Signed)
OUTPATIENT OCCUPATIONAL THERAPY NEURO TREATMENT  Patient Name: Pedro Swanson MRN: [id number] DOB:01/31/1951, 71 y.o., male Today's Date: 04/26/2022  PCP: Eulis Foster REFERRING PROVIDER: Jennings Books  END OF SESSION:  OT End of Session - 04/28/22 1842     Visit Number 13    Number of Visits 17    Date for OT Re-Evaluation 05/16/22    OT Start Time 1259    OT Stop Time 1402    OT Time Calculation (min) 63 min    Activity Tolerance Patient tolerated treatment well    Behavior During Therapy [facility] for tasks assessed/performed              Past Medical History:  Diagnosis Date   Cataract    COVID    Hyperlipidemia    Hypertension    Palpitations    Shingles    Past Surgical History:  Procedure Laterality Date   CATARACT EXTRACTION     right eye   Patient Active Problem List   Diagnosis Date Noted   OSA (obstructive sleep apnea) 12/14/2020   Paroxysmal atrial fibrillation ([place]) 07/11/2020   Pain in joint of left shoulder 01/11/2020   Atherosclerosis of abdominal aorta ([place]) 05/04/2019   Arteriosclerotic cardiovascular disease (ASCVD) 07/08/2018   Coronary artery disease involving native coronary artery of native heart 10/02/2017   Benign fibroma of prostate 01/18/2015   Hypercholesteremia 01/18/2015   BP (high blood pressure) 01/18/2015   Osteoarthrosis 01/18/2015   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  Pt reports he is doing well with exercises at  home and performed 2 times last date.     PERTINENT HISTORY: Pt seen by neurology this year for evalution for Parkinson like symptoms.  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  Possible neuropathy with paresthesia in bilateral feet with reduced intraepidermal nerve fiber density on distal right thigh found on syn-one test 01/19/2022. History of Obstructive sleep apnea on CPAP, REM behavior disorder, vitamin deficiency, Shingles, A fib.   PRECAUTIONS: Fall  WEIGHT BEARING RESTRICTIONS: No  PAIN:  Are you having pain? No  FALLS: Has patient fallen in last 6 months? Yes. Number of falls 1  LIVING ENVIRONMENT: Lives with: lives with their family and lives with their spouse Lives in: House/apartment Has following equipment at home: None  PLOF: Independent with basic ADLs  PATIENT GOALS: Pt reports he would like to improve his balance and reduce his risk of falls.     HAND DOMINANCE: Right  ADLs: Overall ADLs: modified independent Transfers/ambulation related to ADLs: modified independent, no assistive device at this time Eating: modified independent Grooming: modified independent UB Dressing:  modified independent, increased time for buttons, snaps and zippers LB Dressing: modified independent but requires increased time to complete tasks compared to a year ago Toileting: modified independent Bathing: modified independent Tub Shower transfers: modified independent Equipment: none    IADLs: Shopping: able to complete small  purchases, wife does most of the shopping Light housekeeping: able to help around the home with light tasks but wife does most of housekeeping chores. Meal Prep: modified independent Yardwork:  Pt reports requiring increased assistance for yardwork over time, they now moved to a townhome so he does not have to perform these tasks.   Community mobility: modified independent but reports slower with ambulation in recent months. He is still able to drive.  Medication management: Pt requires reminders for medication  Financial management: modified independent Handwriting: 75% legible Leisure tasks include:  Music, painting and sailing, pt reports he is not able to participate in many of these tasks as he did in the past, especially with sailing and decreased balance.  MOBILITY STATUS: Hx of falls  POSTURE COMMENTS:  rounded shoulders, forwards flexed posture Sitting balance: Sits without UE support up to 30 sec  ACTIVITY TOLERANCE: Activity tolerance: Pt reports increased time to perform tasks, will take rest breaks as needed.  FUNCTIONAL OUTCOME MEASURES: FOTO: 72.88  UPPER EXTREMITY ROM:    Active ROM Right eval Left eval  Shoulder flexion 130 90  Shoulder abduction 128 88  Shoulder adduction    Shoulder extension    Shoulder internal rotation    Shoulder external rotation    Elbow flexion 140 140  Elbow extension 0 0  Wrist flexion    Wrist extension    Wrist ulnar deviation    Wrist radial deviation    Wrist pronation    Wrist supination    (Blank rows = not tested)  UPPER EXTREMITY MMT:     MMT Right eval Left eval  Shoulder flexion 4/5 3-/5  Shoulder abduction 4/5 3-/5  Shoulder adduction    Shoulder extension    Shoulder internal rotation    Shoulder external rotation    Middle trapezius    Lower trapezius    Elbow flexion 4/5 4/5  Elbow extension    Wrist flexion 4/5 4/5  Wrist extension 4/5 4/5  Wrist ulnar deviation    Wrist radial deviation    Wrist  pronation    Wrist supination    (Blank rows = not tested)  HAND FUNCTION: Grip strength: Right: 95 lbs; Left: 94 lbs, Lateral pinch: Right: 25 lbs, Left: 17 lbs, and 3 point pinch: Right: 17 lbs, Left: 20 lbs  COORDINATION: 9 Hole Peg test: Right: 28 sec; Left: 34 sec  SENSATION: WFL  EDEMA: none  COGNITION: Overall cognitive status: Within functional limits for tasks assessed, Pt reports difficulty at times recalling names.  Appears to demonstrate slower processing of new information and requires increased time for new learning.  VISION: Subjective report: Reports he had cararacts but were removed. Baseline vision: Wears glasses for reading only Visual history: cataracts  VISION ASSESSMENT: WFL  Patient has difficulty with following activities due to following visual impairments: needs glasses when viewing printed text  PERCEPTION: WFL   ADDITIONAL TESTING at EVAL:   6 minute walk test 790 feet. 10th visit: 995 feet  Observations:  Pt with decreased reciprocal arm swing, decreased step length-tends to have short, shuffled gait, can be easily distracted during task.  Forward flexed posture throughout test.   5 times sit to stand 14  secs, 10 visit: 14 secs.  BERG balance test: 43/56, 10th visit: 45  TODAY'S TREATMENT:                                                                                                                              DATE: 04/26/2022  Neuromuscular Reeducation:   Patient seen for instruction of LSVT BIG exercises: LSVT Daily Session Maximal Daily Exercises: Sustained movements are designed to rescale the amplitude of movement output for generalization to daily functional activities. Performed as follows for 1 set of 10 repetitions each: Multi directional sustained movements- 1) Floor to ceiling, 2) Side to side. Multi directional Repetitive movements performed in standing and are designed to provide retraining effort needed for sustained muscle  activation in tasks Performed as follows: 3) Step and reach forward, 4) Step and Reach Backwards, 5) Step and reach sideways, 6) Rock and reach forward/backward, 7) Rock and reach sideways. Sit to stand from mat table on lowest setting with cues for weight shift, technique for 10 reps for 1 set.     Exercises were performed this date in the standard version with min guard as needed for balance with stepping backwards and during rock and reach.  Able to complete exercises with correct form, cues provided for greater amplitude facilitating BIG movement patterns.  Posture exercises in standing with back against the wall, completing 5 reps of 1 min holds, occasional manual cues/stretch for scapular retraction, head position and stretch to pectoralis muscles with place and hold. Pt able to demonstrate good form this date.  ROM with med playground size ball for shoulder flexion to 90 degrees with scapular retraction and upright posture.   Seated marching for 10 reps on each side, ankle pumps   7  hand exercises to help promote handwriting, dexterity and flexibility. 1)  Fisting with arm then arm extension with fingers extended with BIG hand movements 2) oppositional movements of the thumb to each digit 3) wrist flex/ext with elbows extended 4) supination/pronation 5) tendon gliding with hook fist and then finger extension 6) tendon gliding with tabletop movement of fingers with MP flexion, PIP and DIP extension 7) MP flexion, PIP flexion, DIP extension Performed with therapist demo and cues for form, pt able to recall 5 of 7 hand exercises   Balance:   Activities in standing with weight shifts from right to left then left to right.  Continued focus on single leg stance this date with min guard to min assist, able to demonstrate for 10 secs on right and 8-10 secs on the left.   Both exercises with 5 reps on each leg, continues with more difficulty with left than right for both exercises.      Functional  mobility:  Pt seen for functional mobility for 950 feet this date with cues for amplitude of steps, posture when distracted and reciprocal arm swing occasionally.  Continues to demonstrate slower with turns and often pauses with turn before initiating  stepping pattern again.  Continued working towards diminishing pauses with turns.   Continues to demonstrate decreased amplitude of steps and step length when distracted.   PATIENT EDUCATION: Education details: Maximal daily exercises, hand exercises, balance tasks. Person educated: Patient and Spouse Education method: Explanation Education comprehension: verbalized understanding  HOME EXERCISE PROGRAM: Maximal daily exercises; seated marches  GOALS: Goals reviewed with patient? Yes  SHORT TERM GOALS: Target date: 04/20/2022  Pt to be able to perform buttons with improved speed and dexterity for dress shirt in less than 1 min. Baseline: increased time, up to 2 min to complete, 10TH VISIT: less than 2 mins but goal not met yet Goal status: PROGRESSING   LONG TERM GOALS: Target date: 05/16/2022  Patient will improve gait speed and endurance to be able to ambulate 1050 feet in 6 minutes to negotiate around the home and community safely in 4 weeks.  Baseline: Eval-6 min walk test 790, 10TH VISIT: 995  Goal status: PROGRESSING  2.  Patient will complete HEP for maximal daily exercises with modified independence in 4 weeks.  Baseline: no current home program related to Parkinsonism symptoms, 10TH VISIT: progressing well, continues to require cues for select exercises and min guard Goal status: PROGRESSING  3.  Patient will transfer from to sit to stand without the use of arms safely and independently from a variety of chairs/surfaces in 4 weeks.  Baseline: difficulty with sit to stand from lower surface heights and occasionally has to use arms to assist. Goal status: MET  4.  Pt will score 74 or greater on FOTO to demonstrate a clinically  relevant change to impact independence in ADL and IADL tasks at home and in the community.   Baseline: At eval FOTO: 80 Goal status: MET  5.  Pt will improve BERG balance score by 5 points to demonstrate improved balance during functional mobility and during ADL and IADL to reduce fall risk.   Baseline: At eval score: 43, 10th visit:  46 Goal status: PROGRESSING    ASSESSMENT:  CLINICAL IMPRESSION: Pt has continued to make good progress this week in all areas.  Noted improvements in his speed and amplitude of steps with his significant change in 6 min walk test this week.  Balance continues to improve with focus and emphasis balance tasks functionally and with specific balance activities such as single leg stand and tandem stance.  Pt primary issue is short, shuffled gait especially when he is not focused on task or when talking/distracted.  Will continue to work on this area over the next week.  Able to demonstrate maximal daily exercises without cues this date for proper form but still requires cues for greater amplitude of movements.  Continue to work towards calibration of movement patterns and functional balance to decrease fall risk.    PERFORMANCE DEFICITS: in functional skills including IADLs, coordination, dexterity, proprioception, ROM, strength, flexibility, Fine motor control, Gross motor control, mobility, balance, endurance, and UE functional use, cognitive skills including attention and memory, and psychosocial skills including environmental adaptation, habits, and routines and behaviors.   IMPAIRMENTS: are limiting patient from ADLs, IADLs, leisure, and social participation.   CO-MORBIDITIES: may have co-morbidities  that affects occupational performance. Patient will benefit from skilled OT to address above impairments and improve overall function.  MODIFICATION OR ASSISTANCE TO COMPLETE EVALUATION: No modification of tasks or assist necessary to complete an evaluation.  OT  OCCUPATIONAL PROFILE AND HISTORY: Detailed assessment: Review of records and additional review of physical, cognitive,  psychosocial history related to current functional performance.  CLINICAL DECISION MAKING: Moderate - several treatment options, min-mod task modification necessary  REHAB POTENTIAL: Excellent  EVALUATION COMPLEXITY: Low  PLAN:  OT FREQUENCY: 4x/week  OT DURATION: 4 weeks  PLANNED INTERVENTIONS: self care/ADL training, therapeutic exercise, therapeutic activity, neuromuscular re-education, manual therapy, passive range of motion, gait training, balance training, functional mobility training, moist heat, and patient/family education  RECOMMENDED OTHER SERVICES: none at this time  CONSULTED AND AGREED WITH PLAN OF CARE: Patient and family member/caregiver  PLAN FOR NEXT SESSION: Continue with instruction of LSVT BIG maximal daily exercises, balance, ROM, amplitude of movement   Denelda Akerley T Saralyn Willison, OTR/L, CLT  Gratia Disla, OT 04/28/2022, 6:43 PM

## 2022-04-28 NOTE — Therapy (Signed)
OUTPATIENT OCCUPATIONAL THERAPY NEURO TREATMENT  Patient Name: Pedro Swanson MRN: [id number] DOB:Mar 23, 1951, 71 y.o., male Today's Date: 04/25/2022  PCP: Eulis Foster REFERRING PROVIDER: Jennings Books  END OF SESSION:  OT End of Session - 04/28/22 1832     Visit Number 12    Number of Visits 17    Date for OT Re-Evaluation 05/16/22    OT Start Time 9798    OT Stop Time 1400    OT Time Calculation (min) 62 min    Activity Tolerance Patient tolerated treatment well    Behavior During Therapy [facility] for tasks assessed/performed              Past Medical History:  Diagnosis Date   Cataract    COVID    Hyperlipidemia    Hypertension    Palpitations    Shingles    Past Surgical History:  Procedure Laterality Date   CATARACT EXTRACTION     right eye   Patient Active Problem List   Diagnosis Date Noted   OSA (obstructive sleep apnea) 12/14/2020   Paroxysmal atrial fibrillation ([place]) 07/11/2020   Pain in joint of left shoulder 01/11/2020   Atherosclerosis of abdominal aorta ([place]) 05/04/2019   Arteriosclerotic cardiovascular disease (ASCVD) 07/08/2018   Coronary artery disease involving native coronary artery of native heart 10/02/2017   Benign fibroma of prostate 01/18/2015   Hypercholesteremia 01/18/2015   BP (high blood pressure) 01/18/2015   Osteoarthrosis 01/18/2015   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 01/18/2015   Avitaminosis D 01/18/2015    ONSET DATE: 12/04/2021  REFERRING DIAG: Parkinsonism   THERAPY DIAG:  Muscle weakness (generalized)  Other lack of coordination  Unsteadiness on feet  Abnormal posture  Difficulty in walking, not elsewhere classified  Rationale for Evaluation and Treatment: Rehabilitation  SUBJECTIVE:   SUBJECTIVE STATEMENT:  Patient states, " I'll be honest I only did my exercises yesterday once, [facility]  Discussed with patient the importance of prioritizing L SVT, big exercises each day and  performing two times a day during the intensive phase.     PERTINENT HISTORY: Pt seen by neurology this year for evalution for Parkinson like symptoms.  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  Possible neuropathy with paresthesia in bilateral feet with reduced intraepidermal nerve fiber density on distal right thigh found on syn-one test 01/19/2022. History of Obstructive sleep apnea on CPAP, REM behavior disorder, vitamin deficiency, Shingles, A fib.   PRECAUTIONS: Fall  WEIGHT BEARING RESTRICTIONS: No  PAIN:  Are you having pain? No  FALLS: Has patient fallen in last 6 months? Yes. Number of falls 1  LIVING ENVIRONMENT: Lives with: lives with their family and lives with their spouse Lives in: House/apartment Has following equipment at home: None  PLOF: Independent with basic ADLs  PATIENT GOALS: Pt reports he would like to improve his balance and reduce his risk of falls.     HAND DOMINANCE: Right  ADLs: Overall ADLs: modified independent Transfers/ambulation related to ADLs: modified independent, no assistive device at this time Eating: modified independent Grooming: modified independent UB Dressing: modified  independent, increased time for buttons, snaps and zippers LB Dressing: modified independent but requires increased time to complete tasks compared to a year ago Toileting: modified independent Bathing: modified independent Tub Shower transfers: modified independent Equipment: none    IADLs: Shopping: able to  complete small purchases, wife does most of the shopping Light housekeeping: able to help around the home with light tasks but wife does most of housekeeping chores. Meal Prep: modified independent Yardwork:  Pt reports requiring increased assistance for yardwork over time, they now moved to a townhome so he does not have to perform these tasks.   Community mobility: modified independent but reports slower with ambulation in recent months. He is still able to drive.  Medication management: Pt requires reminders for medication  Financial management: modified independent Handwriting: 75% legible Leisure tasks include:  Music, painting and sailing, pt reports he is not able to participate in many of these tasks as he did in the past, especially with sailing and decreased balance.  MOBILITY STATUS: Hx of falls  POSTURE COMMENTS:  rounded shoulders, forwards flexed posture Sitting balance: Sits without UE support up to 30 sec  ACTIVITY TOLERANCE: Activity tolerance: Pt reports increased time to perform tasks, will take rest breaks as needed.  FUNCTIONAL OUTCOME MEASURES: FOTO: 72.88  UPPER EXTREMITY ROM:    Active ROM Right eval Left eval  Shoulder flexion 130 90  Shoulder abduction 128 88  Shoulder adduction    Shoulder extension    Shoulder internal rotation    Shoulder external rotation    Elbow flexion 140 140  Elbow extension 0 0  Wrist flexion    Wrist extension    Wrist ulnar deviation    Wrist radial deviation    Wrist pronation    Wrist supination    (Blank rows = not tested)  UPPER EXTREMITY MMT:     MMT Right eval Left eval  Shoulder flexion 4/5 3-/5  Shoulder abduction 4/5 3-/5  Shoulder adduction    Shoulder extension    Shoulder internal rotation    Shoulder external rotation    Middle trapezius    Lower trapezius    Elbow flexion 4/5 4/5  Elbow extension    Wrist flexion 4/5 4/5  Wrist extension 4/5 4/5  Wrist ulnar deviation    Wrist radial  deviation    Wrist pronation    Wrist supination    (Blank rows = not tested)  HAND FUNCTION: Grip strength: Right: 95 lbs; Left: 94 lbs, Lateral pinch: Right: 25 lbs, Left: 17 lbs, and 3 point pinch: Right: 17 lbs, Left: 20 lbs  COORDINATION: 9 Hole Peg test: Right: 28 sec; Left: 34 sec  SENSATION: WFL  EDEMA: none  COGNITION: Overall cognitive status: Within functional limits for tasks assessed, Pt reports difficulty at times recalling names.  Appears to demonstrate slower processing of new information and requires increased time for new learning.  VISION: Subjective report: Reports he had cararacts but were removed. Baseline vision: Wears glasses for reading only Visual history: cataracts  VISION ASSESSMENT: WFL  Patient has difficulty with following activities due to following visual impairments: needs glasses when viewing printed text  PERCEPTION: WFL   ADDITIONAL TESTING at EVAL:   6 minute walk test 790 feet. 10th visit: 995 feet  Observations:  Pt with decreased reciprocal arm swing, decreased step length-tends to have short, shuffled gait, can be easily distracted during task.  Forward flexed posture throughout test.   5 times sit to stand 14 secs,  10 visit: 14 secs.  BERG balance test: 43/56, 10th visit: 19  TODAY'S TREATMENT:                                                                                                                              DATE: 04/25/2022  Neuromuscular Reeducation:   Patient seen for instruction of LSVT BIG exercises: LSVT Daily Session Maximal Daily Exercises: Sustained movements are designed to rescale the amplitude of movement output for generalization to daily functional activities. Performed as follows for 1 set of 10 repetitions each: Multi directional sustained movements- 1) Floor to ceiling, 2) Side to side. Multi directional Repetitive movements performed in standing and are designed to provide retraining effort needed for  sustained muscle activation in tasks Performed as follows: 3) Step and reach forward, 4) Step and Reach Backwards, 5) Step and reach sideways, 6) Rock and reach forward/backward, 7) Rock and reach sideways. Sit to stand from mat table on lowest setting with cues for weight shift, technique for 10 reps for 1 set.     Exercises were performed this date in the standard version with min guard as needed for balance with stepping backwards and during rock and reach.  Pt able to recall and perform side to side exercise correctly this date again without therapist providing cues.  Posture exercises in standing with back against the wall, completing 5 reps of 1 min holds, manual cues for scapular retraction, head position and stretch to pectoralis muscles with place and hold. Pt able to demonstrate good form with this exercise even with distractions in place.   Seated marching for 10 reps on each side, ankle pumps   7  hand exercises to help promote handwriting, dexterity and flexibility. 1)  Fisting with arm then arm extension with fingers extended with BIG hand movements 2) oppositional movements of the thumb to each digit 3) wrist flex/ext with elbows extended 4) supination/pronation 5) tendon gliding with hook fist and then finger extension 6) tendon gliding with tabletop movement of fingers with MP flexion, PIP and DIP extension 7) MP flexion, PIP flexion, DIP extension Performed with therapist demo and cues for form, pt able to recall 5 of 7 hand exercises   Balance:   Activities in standing with weight shifts from right to left, added turning with quarter turns. Continued focus on single leg stance this date with min guard to min assist, able to demonstrate for 8-10 secs on right and 6-8 secs on the left.   Tandem stance for 5 reps on each leg, continues with more difficulty with left than right, modified task as needed with placement of feet and progressing towards full tandem stance.    Functional  mobility:  Pt seen for functional mobility for 825 feet this date with cues for amplitude of steps, posture when distracted and reciprocal arm swing occasionally.  Slower with turns and often pauses with turn before initiating stepping pattern again.  Worked towards diminishing  pauses with turns.  Performed this date with and without distractions during mobility. Continues to demonstrate decreased amplitude of steps and step length when distracted.   PATIENT EDUCATION: Education details: Maximal daily exercises, hand exercises, balance tasks. Person educated: Patient and Spouse Education method: Explanation Education comprehension: verbalized understanding  HOME EXERCISE PROGRAM: Maximal daily exercises; seated marches  GOALS: Goals reviewed with patient? Yes  SHORT TERM GOALS: Target date: 04/20/2022  Pt to be able to perform buttons with improved speed and dexterity for dress shirt in less than 1 min. Baseline: increased time, up to 2 min to complete, 10TH VISIT: less than 2 mins but goal not met yet Goal status: PROGRESSING   LONG TERM GOALS: Target date: 05/16/2022  Patient will improve gait speed and endurance to be able to ambulate 1050 feet in 6 minutes to negotiate around the home and community safely in 4 weeks.  Baseline: Eval-6 min walk test 790, 10TH VISIT: 995  Goal status: PROGRESSING  2.  Patient will complete HEP for maximal daily exercises with modified independence in 4 weeks.  Baseline: no current home program related to Parkinsonism symptoms, 10TH VISIT: progressing well, continues to require cues for select exercises and min guard Goal status: PROGRESSING  3.  Patient will transfer from to sit to stand without the use of arms safely and independently from a variety of chairs/surfaces in 4 weeks.  Baseline: difficulty with sit to stand from lower surface heights and occasionally has to use arms to assist. Goal status: MET  4.  Pt will score 74 or greater on  FOTO to demonstrate a clinically relevant change to impact independence in ADL and IADL tasks at home and in the community.   Baseline: At eval FOTO: 80 Goal status: MET  5.  Pt will improve BERG balance score by 5 points to demonstrate improved balance during functional mobility and during ADL and IADL to reduce fall risk.   Baseline: At eval score: 43, 10th visit:  46 Goal status: PROGRESSING    ASSESSMENT:  CLINICAL IMPRESSION: Patient demonstrating improved speed in completion of maximal daily exercises with less distractions during treatment.  Patient continues to work towards balance activities in the clinic with therapist, providing min guard to min assist as needed. Improvements noted this week with single leg stance and tandem stance. Discussed importance of performing maximal daily exercises two times a day for 10 repetitions each during the intensive phase.  Patient voices understanding, and will work towards being consistent at completing exercises daily twice a day. Focused this date on amplitude of steps with functional mobility, as well as completing turns working on calibration of movement pattern and extinguishing unnecessary pauses with turns.     PERFORMANCE DEFICITS: in functional skills including IADLs, coordination, dexterity, proprioception, ROM, strength, flexibility, Fine motor control, Gross motor control, mobility, balance, endurance, and UE functional use, cognitive skills including attention and memory, and psychosocial skills including environmental adaptation, habits, and routines and behaviors.   IMPAIRMENTS: are limiting patient from ADLs, IADLs, leisure, and social participation.   CO-MORBIDITIES: may have co-morbidities  that affects occupational performance. Patient will benefit from skilled OT to address above impairments and improve overall function.  MODIFICATION OR ASSISTANCE TO COMPLETE EVALUATION: No modification of tasks or assist necessary to  complete an evaluation.  OT OCCUPATIONAL PROFILE AND HISTORY: Detailed assessment: Review of records and additional review of physical, cognitive, psychosocial history related to current functional performance.  CLINICAL DECISION MAKING: Moderate - several treatment options, min-mod  task modification necessary  REHAB POTENTIAL: Excellent  EVALUATION COMPLEXITY: Low  PLAN:  OT FREQUENCY: 4x/week  OT DURATION: 4 weeks  PLANNED INTERVENTIONS: self care/ADL training, therapeutic exercise, therapeutic activity, neuromuscular re-education, manual therapy, passive range of motion, gait training, balance training, functional mobility training, moist heat, and patient/family education  RECOMMENDED OTHER SERVICES: none at this time  CONSULTED AND AGREED WITH PLAN OF CARE: Patient and family member/caregiver  PLAN FOR NEXT SESSION: Continue with instruction of LSVT BIG maximal daily exercises, balance, ROM, amplitude of movement   Kaylor Maiers T Nadiyah Zeis, OTR/L, CLT  Jadalyn Oliveri, OT 04/28/2022, 6:34 PM

## 2022-04-30 ENCOUNTER — Ambulatory Visit: Payer: Medicare HMO | Admitting: Occupational Therapy

## 2022-04-30 DIAGNOSIS — R278 Other lack of coordination: Secondary | ICD-10-CM

## 2022-04-30 DIAGNOSIS — M6281 Muscle weakness (generalized): Secondary | ICD-10-CM

## 2022-04-30 DIAGNOSIS — R262 Difficulty in walking, not elsewhere classified: Secondary | ICD-10-CM

## 2022-04-30 DIAGNOSIS — R293 Abnormal posture: Secondary | ICD-10-CM

## 2022-04-30 DIAGNOSIS — R2681 Unsteadiness on feet: Secondary | ICD-10-CM

## 2022-05-01 ENCOUNTER — Ambulatory Visit: Payer: Medicare HMO | Admitting: Occupational Therapy

## 2022-05-01 DIAGNOSIS — R2681 Unsteadiness on feet: Secondary | ICD-10-CM

## 2022-05-01 DIAGNOSIS — M6281 Muscle weakness (generalized): Secondary | ICD-10-CM | POA: Diagnosis not present

## 2022-05-01 DIAGNOSIS — R262 Difficulty in walking, not elsewhere classified: Secondary | ICD-10-CM

## 2022-05-01 DIAGNOSIS — R278 Other lack of coordination: Secondary | ICD-10-CM

## 2022-05-01 DIAGNOSIS — R293 Abnormal posture: Secondary | ICD-10-CM

## 2022-05-02 ENCOUNTER — Ambulatory Visit: Payer: Medicare HMO | Admitting: Occupational Therapy

## 2022-05-02 DIAGNOSIS — M6281 Muscle weakness (generalized): Secondary | ICD-10-CM | POA: Diagnosis not present

## 2022-05-02 DIAGNOSIS — R278 Other lack of coordination: Secondary | ICD-10-CM

## 2022-05-02 DIAGNOSIS — R2681 Unsteadiness on feet: Secondary | ICD-10-CM

## 2022-05-02 DIAGNOSIS — R262 Difficulty in walking, not elsewhere classified: Secondary | ICD-10-CM

## 2022-05-02 DIAGNOSIS — R293 Abnormal posture: Secondary | ICD-10-CM

## 2022-05-03 ENCOUNTER — Ambulatory Visit: Payer: Medicare HMO | Admitting: Occupational Therapy

## 2022-05-03 DIAGNOSIS — R2681 Unsteadiness on feet: Secondary | ICD-10-CM

## 2022-05-03 DIAGNOSIS — M6281 Muscle weakness (generalized): Secondary | ICD-10-CM | POA: Diagnosis not present

## 2022-05-03 DIAGNOSIS — R293 Abnormal posture: Secondary | ICD-10-CM

## 2022-05-03 DIAGNOSIS — R262 Difficulty in walking, not elsewhere classified: Secondary | ICD-10-CM

## 2022-05-03 DIAGNOSIS — R278 Other lack of coordination: Secondary | ICD-10-CM

## 2022-05-12 ENCOUNTER — Encounter: Payer: Self-pay | Admitting: Occupational Therapy

## 2022-05-12 NOTE — Therapy (Signed)
OUTPATIENT OCCUPATIONAL THERAPY NEURO TREATMENT  Patient Name: Pedro Swanson MRN: [id number] DOB:02-02-1951, 71 y.o., male Today's Date: 05/01/2022  PCP: Eulis Foster REFERRING PROVIDER: Jennings Books   OT End of Session     Visit Number 15    Number of Visits 17    Date for OT Re-Evaluation 05/16/22    OT Start Time 91    OT Stop Time 1400    OT Time Calculation (min) 60 min    Activity Tolerance Patient tolerated treatment well    Behavior During Therapy [facility] for tasks assessed/performed                   Past Medical History:  Diagnosis Date   Cataract    COVID    Hyperlipidemia    Hypertension    Palpitations    Shingles    Past Surgical History:  Procedure Laterality Date   CATARACT EXTRACTION     right eye   Patient Active Problem List   Diagnosis Date Noted   OSA (obstructive sleep apnea) 12/14/2020   Paroxysmal atrial fibrillation ([place]) 07/11/2020   Pain in joint of left shoulder 01/11/2020   Atherosclerosis of abdominal aorta ([place]) 05/04/2019   Arteriosclerotic cardiovascular disease (ASCVD) 07/08/2018   Coronary artery disease involving native coronary artery of native heart 10/02/2017   Benign fibroma of prostate 01/18/2015   Hypercholesteremia 01/18/2015   BP (high blood pressure) 01/18/2015   Osteoarthrosis 01/18/2015   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    PERTINENT HISTORY: Pt seen by neurology this year for  evalution for Parkinson like symptoms.  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  Possible neuropathy with paresthesia in bilateral feet with reduced intraepidermal nerve fiber density on distal right thigh found on syn-one test 01/19/2022. History of Obstructive sleep apnea on CPAP, REM behavior disorder, vitamin deficiency, Shingles, A fib.   PRECAUTIONS: Fall  WEIGHT BEARING RESTRICTIONS: No  PAIN:  Are you having pain? No  FALLS: Has patient fallen in last 6 months? Yes. Number of falls 1  LIVING ENVIRONMENT: Lives with: lives with their family and lives with their spouse Lives in: House/apartment Has following equipment at home: None  PLOF: Independent with basic ADLs  PATIENT GOALS: Pt reports he would like to improve his balance and reduce his risk of falls.     HAND DOMINANCE: Right  ADLs: Overall ADLs: modified independent Transfers/ambulation related to ADLs: modified independent, no assistive device at this time Eating: modified independent Grooming: modified independent UB Dressing: modified independent, increased time for buttons, snaps and zippers LB Dressing: modified independent but requires increased time  to complete tasks compared to a year ago Toileting: modified independent Bathing: modified independent Tub Shower transfers: modified independent Equipment: none    IADLs: Shopping: able to complete small purchases, wife does most of the shopping Light housekeeping: able to help around the home with  light tasks but wife does most of housekeeping chores. Meal Prep: modified independent Yardwork:  Pt reports requiring increased assistance for yardwork over time, they now moved to a townhome so he does not have to perform these tasks.   Community mobility: modified independent but reports slower with ambulation in recent months. He is still able to drive.  Medication management: Pt requires reminders for medication  Financial management: modified independent Handwriting: 75% legible Leisure tasks include:  Music, painting and sailing, pt reports he is not able to participate in many of these tasks as he did in the past, especially with sailing and decreased balance.  MOBILITY STATUS: Hx of falls  POSTURE COMMENTS:  rounded shoulders, forwards flexed posture Sitting balance: Sits without UE support up to 30 sec  ACTIVITY TOLERANCE: Activity tolerance: Pt reports increased time to perform tasks, will take rest breaks as needed.  FUNCTIONAL OUTCOME MEASURES: FOTO: 72.88  UPPER EXTREMITY ROM:    Active ROM Right eval Left eval  Shoulder flexion 130 90  Shoulder abduction 128 88  Shoulder adduction    Shoulder extension    Shoulder internal rotation    Shoulder external rotation    Elbow flexion 140 140  Elbow extension 0 0  Wrist flexion    Wrist extension    Wrist ulnar deviation    Wrist radial deviation    Wrist pronation    Wrist supination    (Blank rows = not tested)  UPPER EXTREMITY MMT:     MMT Right eval Left eval  Shoulder flexion 4/5 3-/5  Shoulder abduction 4/5 3-/5  Shoulder adduction    Shoulder extension    Shoulder internal rotation    Shoulder external rotation    Middle trapezius    Lower trapezius    Elbow flexion 4/5 4/5  Elbow extension    Wrist flexion 4/5 4/5  Wrist extension 4/5 4/5  Wrist ulnar deviation    Wrist radial deviation    Wrist pronation    Wrist supination    (Blank rows = not tested)  HAND FUNCTION: Grip strength:  Right: 95 lbs; Left: 94 lbs, Lateral pinch: Right: 25 lbs, Left: 17 lbs, and 3 point pinch: Right: 17 lbs, Left: 20 lbs  COORDINATION: 9 Hole Peg test: Right: 28 sec; Left: 34 sec  SENSATION: WFL  EDEMA: none  COGNITION: Overall cognitive status: Within functional limits for tasks assessed, Pt reports difficulty at times recalling names.  Appears to demonstrate slower processing of new information and requires increased time for new learning.  VISION: Subjective report: Reports he had cararacts but were removed. Baseline vision: Wears glasses for reading only Visual history: cataracts  VISION ASSESSMENT: WFL  Patient has difficulty with following activities due to following visual impairments: needs glasses when viewing printed text  PERCEPTION: WFL   ADDITIONAL TESTING at EVAL:   6 minute walk test 790 feet. 10th visit: 995 feet  Observations:  Pt with decreased reciprocal arm swing, decreased step length-tends to have short, shuffled gait, can be easily distracted during task.  Forward flexed posture throughout test.   5 times sit to stand 14 secs, 10 visit: 14 secs.  BERG balance test: 43/56, 10th visit: 46  TODAY'S TREATMENT:  DATE: 05/01/2022  Neuromuscular Reeducation:   Patient seen for instruction of LSVT BIG exercises: LSVT Daily Session Maximal Daily Exercises: Sustained movements are designed to rescale the amplitude of movement output for generalization to daily functional activities. Performed as follows for 1 set of 10 repetitions each: Multi directional sustained movements- 1) Floor to ceiling, 2) Side to side. Multi directional Repetitive movements performed in standing and are designed to provide retraining effort needed for sustained muscle activation in tasks Performed as follows: 3) Step and reach forward, 4) Step and Reach Backwards, 5)  Step and reach sideways, 6) Rock and reach forward/backward, 7) Rock and reach sideways. Sit to stand from mat table on lowest setting with cues for weight shift, technique for 10 reps for 1 set.     Exercises were performed this date in the standard version with supervision as needed for balance. Able to complete exercises with correct form, cues provided for greater amplitude facilitating BIG movement patterns.  Posture exercises in standing with back against the wall, completing 5 reps of 1 min holds, with scapular retraction, head position and stretch to pectoralis muscles with place and hold. Pt able to demonstrate good form.  ROM with med playground size ball for shoulder flexion to 90 degrees with scapular retraction and upright posture, 10 reps   Seated marching for 10 reps on each side, ankle pumps, knee extensions   7  hand exercises to help promote handwriting, dexterity and flexibility. 1)  Fisting with arm then arm extension with fingers extended with BIG hand movements 2) oppositional movements of the thumb to each digit 3) wrist flex/ext with elbows extended 4) supination/pronation 5) tendon gliding with hook fist and then finger extension 6) tendon gliding with tabletop movement of fingers with MP flexion, PIP and DIP extension 7) MP flexion, PIP flexion, DIP extension Performed with therapist demo and cues for form, pt able to recall 6 of 7 hand exercises   Balance:   Activities in standing with weight shifts from right to left then left to right.  Continued focus on single leg stance with min guard able to demonstrate for 12 secs on right and 10 secs on the left.   Both exercises with 5 reps on each leg, continues with more difficulty with left than right for both exercises. Balance tasks standing on balance pad to provide further challenge to pt, reaching in multi directional, ball catch.     Functional mobility:  Pt seen for functional mobility for 1025 feet with cues for  amplitude of steps and posture when distracted and reciprocal arm swing occasionally. Continued working towards minimizing and diminishing pauses with turns.   Continues to demonstrate decreased amplitude of steps and step length when distracted and working towards calibration of movement patterns more consistently.  PATIENT EDUCATION: Education details: Maximal daily exercises, hand exercises, balance tasks. Person educated: Patient and Spouse Education method: Explanation Education comprehension: verbalized understanding  HOME EXERCISE PROGRAM: Maximal daily exercises; seated marches  GOALS: Goals reviewed with patient? Yes  SHORT TERM GOALS: Target date: 04/20/2022  Pt to be able to perform buttons with improved speed and dexterity for dress shirt in less than 1 min. Baseline: increased time, up to 2 min to complete, 10TH VISIT: less than 2 mins but goal not met yet Goal status: PROGRESSING   LONG TERM GOALS: Target date: 05/16/2022  Patient will improve gait speed and endurance to be able to ambulate 1050 feet in 6 minutes to negotiate around the home and  community safely in 4 weeks.  Baseline: Eval-6 min walk test 790, 10TH VISIT: 995  Goal status: PROGRESSING  2.  Patient will complete HEP for maximal daily exercises with modified independence in 4 weeks.  Baseline: no current home program related to Parkinsonism symptoms, 10TH VISIT: progressing well, continues to require cues for select exercises and min guard Goal status: PROGRESSING  3.  Patient will transfer from to sit to stand without the use of arms safely and independently from a variety of chairs/surfaces in 4 weeks.  Baseline: difficulty with sit to stand from lower surface heights and occasionally has to use arms to assist. Goal status: MET  4.  Pt will score 74 or greater on FOTO to demonstrate a clinically relevant change to impact independence in ADL and IADL tasks at home and in the community.   Baseline: At  eval FOTO: 80 Goal status: MET  5.  Pt will improve BERG balance score by 5 points to demonstrate improved balance during functional mobility and during ADL and IADL to reduce fall risk.   Baseline: At eval score: 43, 10th visit:  46 Goal status: PROGRESSING    ASSESSMENT:  CLINICAL IMPRESSION: Focus this week on further calibration of movement patterns, continuing to increase amplitude of stepping and movement patterns during all tasks.   Pt did not require cues this date for exercises and is able to complete with supervision for tasks in standing.  Increased challenge for balance with use of balance pad to provide unstable surface area.  Pt has been consistent with performing exercises at home over the last week.   Will provide additional information on HEP and ways to continue to grade activities to increase complexity.    PERFORMANCE DEFICITS: in functional skills including IADLs, coordination, dexterity, proprioception, ROM, strength, flexibility, Fine motor control, Gross motor control, mobility, balance, endurance, and UE functional use, cognitive skills including attention and memory, and psychosocial skills including environmental adaptation, habits, and routines and behaviors.   IMPAIRMENTS: are limiting patient from ADLs, IADLs, leisure, and social participation.   CO-MORBIDITIES: may have co-morbidities  that affects occupational performance. Patient will benefit from skilled OT to address above impairments and improve overall function.  MODIFICATION OR ASSISTANCE TO COMPLETE EVALUATION: No modification of tasks or assist necessary to complete an evaluation.  OT OCCUPATIONAL PROFILE AND HISTORY: Detailed assessment: Review of records and additional review of physical, cognitive, psychosocial history related to current functional performance.  CLINICAL DECISION MAKING: Moderate - several treatment options, min-mod task modification necessary  REHAB POTENTIAL:  Excellent  EVALUATION COMPLEXITY: Low  PLAN:  OT FREQUENCY: 4x/week  OT DURATION: 4 weeks  PLANNED INTERVENTIONS: self care/ADL training, therapeutic exercise, therapeutic activity, neuromuscular re-education, manual therapy, passive range of motion, gait training, balance training, functional mobility training, moist heat, and patient/family education  RECOMMENDED OTHER SERVICES: none at this time  CONSULTED AND AGREED WITH PLAN OF CARE: Patient and family member/caregiver  PLAN FOR NEXT SESSION: Continue with instruction of LSVT BIG maximal daily exercises, balance, ROM, amplitude of movement   Mary-Ann Pennella T Jamyson Jirak, OTR/L, CLT  Lyanne Kates, OT 05/12/2022, 6:41 PM

## 2022-05-12 NOTE — Therapy (Signed)
OUTPATIENT OCCUPATIONAL THERAPY NEURO TREATMENT  Patient Name: Pedro Swanson MRN: [id number] DOB:12-30-1950, 71 y.o., male Today's Date: 05/02/2022  PCP: Eulis Foster REFERRING PROVIDER: Jennings Books   OT End of Session - 05/12/22 1850     Visit Number 16    Number of Visits 17    Date for OT Re-Evaluation 05/16/22    OT Start Time 1259    OT Stop Time 1402    OT Time Calculation (min) 63 min    Activity Tolerance Patient tolerated treatment well    Behavior During Therapy [facility] for tasks assessed/performed                Past Medical History:  Diagnosis Date   Cataract    COVID    Hyperlipidemia    Hypertension    Palpitations    Shingles    Past Surgical History:  Procedure Laterality Date   CATARACT EXTRACTION     right eye   Patient Active Problem List   Diagnosis Date Noted   OSA (obstructive sleep apnea) 12/14/2020   Paroxysmal atrial fibrillation ([place]) 07/11/2020   Pain in joint of left shoulder 01/11/2020   Atherosclerosis of abdominal aorta ([place]) 05/04/2019   Arteriosclerotic cardiovascular disease (ASCVD) 07/08/2018   Coronary artery disease involving native coronary artery of native heart 10/02/2017   Benign fibroma of prostate 01/18/2015   Hypercholesteremia 01/18/2015   BP (high blood pressure) 01/18/2015   Osteoarthrosis 01/18/2015   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  Possible neuropathy with paresthesia in bilateral feet with reduced intraepidermal nerve fiber density on distal right thigh found on syn-one test 01/19/2022. History of Obstructive sleep apnea on CPAP, REM behavior disorder, vitamin deficiency, Shingles, A fib.   PRECAUTIONS: Fall  WEIGHT BEARING RESTRICTIONS: No  PAIN:  Are you having pain? No  FALLS: Has patient fallen in last 6 months? Yes. Number of falls 1  LIVING ENVIRONMENT: Lives with: lives with their family and lives with their spouse Lives in: House/apartment Has following equipment at home: None  PLOF: Independent with basic ADLs  PATIENT GOALS: Pt reports he would like to improve his balance and reduce his risk of falls.     HAND DOMINANCE: Right  ADLs: Overall ADLs: modified independent Transfers/ambulation related to ADLs: modified independent, no assistive device at this time Eating: modified independent Grooming: modified independent UB Dressing: modified independent, increased time for buttons, snaps and zippers LB Dressing: modified independent but requires increased time to complete tasks compared to a year ago Toileting: modified independent Bathing: modified independent Tub Shower transfers: modified independent Equipment: none    IADLs: Shopping: able to complete small purchases,  wife does most of the shopping Light housekeeping: able to help around the home with light tasks but wife does most of housekeeping chores. Meal Prep: modified independent Yardwork:  Pt reports requiring increased assistance  for yardwork over time, they now moved to a townhome so he does not have to perform these tasks.   Community mobility: modified independent but reports slower with ambulation in recent months. He is still able to drive.  Medication management: Pt requires reminders for medication  Financial management: modified independent Handwriting: 75% legible Leisure tasks include:  Music, painting and sailing, pt reports he is not able to participate in many of these tasks as he did in the past, especially with sailing and decreased balance.  MOBILITY STATUS: Hx of falls  POSTURE COMMENTS:  rounded shoulders, forwards flexed posture Sitting balance: Sits without UE support up to 30 sec  ACTIVITY TOLERANCE: Activity tolerance: Pt reports increased time to perform tasks, will take rest breaks as needed.  FUNCTIONAL OUTCOME MEASURES: FOTO: 72.88  UPPER EXTREMITY ROM:    Active ROM Right eval Left eval  Shoulder flexion 130 90  Shoulder abduction 128 88  Shoulder adduction    Shoulder extension    Shoulder internal rotation    Shoulder external rotation    Elbow flexion 140 140  Elbow extension 0 0  Wrist flexion    Wrist extension    Wrist ulnar deviation    Wrist radial deviation    Wrist pronation    Wrist supination    (Blank rows = not tested)  UPPER EXTREMITY MMT:     MMT Right eval Left eval  Shoulder flexion 4/5 3-/5  Shoulder abduction 4/5 3-/5  Shoulder adduction    Shoulder extension    Shoulder internal rotation    Shoulder external rotation    Middle trapezius    Lower trapezius    Elbow flexion 4/5 4/5  Elbow extension    Wrist flexion 4/5 4/5  Wrist extension 4/5 4/5  Wrist ulnar deviation    Wrist radial deviation    Wrist pronation    Wrist supination    (Blank rows = not tested)  HAND FUNCTION: Grip strength: Right: 95 lbs; Left: 94 lbs, Lateral pinch: Right: 25 lbs, Left: 17 lbs, and 3 point pinch: Right: 17 lbs, Left: 20 lbs  COORDINATION: 9  Hole Peg test: Right: 28 sec; Left: 34 sec  SENSATION: WFL  EDEMA: none  COGNITION: Overall cognitive status: Within functional limits for tasks assessed, Pt reports difficulty at times recalling names.  Appears to demonstrate slower processing of new information and requires increased time for new learning.  VISION: Subjective report: Reports he had cararacts but were removed. Baseline vision: Wears glasses for reading only Visual history: cataracts  VISION ASSESSMENT: WFL  Patient has difficulty with following activities due to following visual impairments: needs glasses when viewing printed text  PERCEPTION: WFL   ADDITIONAL TESTING at EVAL:   6 minute walk test 790 feet. 10th visit: 995 feet  Observations:  Pt with decreased reciprocal arm swing, decreased step length-tends to have short, shuffled gait, can be easily distracted during task.  Forward flexed posture throughout test.   5 times sit to stand 14 secs, 10 visit: 14 secs.  BERG balance test: 43/56, 10th visit: 46  TODAY'S TREATMENT:  DATE: 05/02/2022  Neuromuscular Reeducation:   Patient seen for instruction of LSVT BIG exercises: LSVT Daily Session Maximal Daily Exercises: Sustained movements are designed to rescale the amplitude of movement output for generalization to daily functional activities. Performed as follows for 1 set of 10 repetitions each: Multi directional sustained movements- 1) Floor to ceiling, 2) Side to side. Multi directional Repetitive movements performed in standing and are designed to provide retraining effort needed for sustained muscle activation in tasks Performed as follows: 3) Step and reach forward, 4) Step and Reach Backwards, 5) Step and reach sideways, 6) Rock and reach forward/backward, 7) Rock and reach sideways. Sit to stand from mat table on lowest setting with  cues for weight shift, technique for 10 reps for 1 set.     Exercises were performed this date in the standard version with supervision as needed for balance. Able to complete exercises with correct form, cues provided for greater amplitude facilitating BIG movement patterns.  Posture exercises in standing with back against the wall, completing 5 reps of 1 min holds, with scapular retraction, head position and stretch to pectoralis muscles with place and hold. Pt able to demonstrate good form.  ROM with med playground size ball for shoulder flexion to 90 degrees with scapular retraction and upright posture, 10 reps   Seated marching for 10 reps on each side, ankle pumps, knee extensions   7  hand exercises to help promote handwriting, dexterity and flexibility. 1)  Fisting with arm then arm extension with fingers extended with BIG hand movements 2) oppositional movements of the thumb to each digit 3) wrist flex/ext with elbows extended 4) supination/pronation 5) tendon gliding with hook fist and then finger extension 6) tendon gliding with tabletop movement of fingers with MP flexion, PIP and DIP extension 7) MP flexion, PIP flexion, DIP extension Performed with therapist demo and cues for form, pt able to recall 6 of 7 hand exercises   Balance:   Activities in standing with weight shifts from right to left then left to right.  Continued focus on single leg stance with min guard able to demonstrate for 12 secs on right and 10 secs on the left.   Both exercises with 5 reps on each leg, continues with more difficulty with left than right for both exercises. Balance tasks standing on balance pad to provide further challenge to pt, reaching in multi directional, ball catch.     Functional mobility:  Pt seen for functional mobility for 1025 feet with cues for amplitude of steps and posture when distracted and reciprocal arm swing occasionally. Continued working towards minimizing and diminishing  pauses with turns.   Continues to demonstrate decreased amplitude of steps and step length when distracted and working towards calibration of movement patterns more consistently.  PATIENT EDUCATION: Education details: Maximal daily exercises, hand exercises, balance tasks. Person educated: Patient and Spouse Education method: Explanation Education comprehension: verbalized understanding  HOME EXERCISE PROGRAM: Maximal daily exercises; seated marches  GOALS: Goals reviewed with patient? Yes  SHORT TERM GOALS: Target date: 04/20/2022  Pt to be able to perform buttons with improved speed and dexterity for dress shirt in less than 1 min. Baseline: increased time, up to 2 min to complete, 10TH VISIT: less than 2 mins but goal not met yet Goal status: PROGRESSING   LONG TERM GOALS: Target date: 05/16/2022  Patient will improve gait speed and endurance to be able to ambulate 1050 feet in 6 minutes to negotiate around the home and  community safely in 4 weeks.  Baseline: Eval-6 min walk test 790, 10TH VISIT: 995  Goal status: PROGRESSING  2.  Patient will complete HEP for maximal daily exercises with modified independence in 4 weeks.  Baseline: no current home program related to Parkinsonism symptoms, 10TH VISIT: progressing well, continues to require cues for select exercises and min guard Goal status: PROGRESSING  3.  Patient will transfer from to sit to stand without the use of arms safely and independently from a variety of chairs/surfaces in 4 weeks.  Baseline: difficulty with sit to stand from lower surface heights and occasionally has to use arms to assist. Goal status: MET  4.  Pt will score 74 or greater on FOTO to demonstrate a clinically relevant change to impact independence in ADL and IADL tasks at home and in the community.   Baseline: At eval FOTO: 80 Goal status: MET  5.  Pt will improve BERG balance score by 5 points to demonstrate improved balance during functional  mobility and during ADL and IADL to reduce fall risk.   Baseline: At eval score: 43, 10th visit:  46 Goal status: PROGRESSING    ASSESSMENT:  CLINICAL IMPRESSION: Pt progressing well in all areas.  Will plan to perform outcome measures next date and prepare for discharge.  Pt completing exercises with modified independence this date.  Balance tasks with min guard, min assist when tasks are more dynamic challenge.  Pt continues to demonstrate impairments with balance and posture.  Will plan for discharge next session.      PERFORMANCE DEFICITS: in functional skills including IADLs, coordination, dexterity, proprioception, ROM, strength, flexibility, Fine motor control, Gross motor control, mobility, balance, endurance, and UE functional use, cognitive skills including attention and memory, and psychosocial skills including environmental adaptation, habits, and routines and behaviors.   IMPAIRMENTS: are limiting patient from ADLs, IADLs, leisure, and social participation.   CO-MORBIDITIES: may have co-morbidities  that affects occupational performance. Patient will benefit from skilled OT to address above impairments and improve overall function.  MODIFICATION OR ASSISTANCE TO COMPLETE EVALUATION: No modification of tasks or assist necessary to complete an evaluation.  OT OCCUPATIONAL PROFILE AND HISTORY: Detailed assessment: Review of records and additional review of physical, cognitive, psychosocial history related to current functional performance.  CLINICAL DECISION MAKING: Moderate - several treatment options, min-mod task modification necessary  REHAB POTENTIAL: Excellent  EVALUATION COMPLEXITY: Low  PLAN:  OT FREQUENCY: 4x/week  OT DURATION: 4 weeks  PLANNED INTERVENTIONS: self care/ADL training, therapeutic exercise, therapeutic activity, neuromuscular re-education, manual therapy, passive range of motion, gait training, balance training, functional mobility training, moist  heat, and patient/family education  RECOMMENDED OTHER SERVICES: none at this time  CONSULTED AND AGREED WITH PLAN OF CARE: Patient and family member/caregiver  PLAN FOR NEXT SESSION: Continue with instruction of LSVT BIG maximal daily exercises, balance, ROM, amplitude of movement   Mashanda Ishibashi T Makale Pindell, OTR/L, CLT  Kirtan Sada, OT 05/12/2022, 7:31 PM

## 2022-05-12 NOTE — Therapy (Signed)
OUTPATIENT OCCUPATIONAL THERAPY NEURO TREATMENT  Patient Name: Oumar Marcott MRN: [id number] DOB:1950/11/12, 71 y.o., male Today's Date: 04/30/2022  PCP: Eulis Foster REFERRING PROVIDER: Jennings Books  END OF SESSION:  OT End of Session     Visit Number 14    Number of Visits 17    Date for OT Re-Evaluation 05/16/22    OT Start Time 1258    OT Stop Time 1400    OT Time Calculation (min) 62 min    Activity Tolerance Patient tolerated treatment well    Behavior During Therapy [facility] for tasks assessed/performed              Past Medical History:  Diagnosis Date   Cataract    COVID    Hyperlipidemia    Hypertension    Palpitations    Shingles    Past Surgical History:  Procedure Laterality Date   CATARACT EXTRACTION     right eye   Patient Active Problem List   Diagnosis Date Noted   OSA (obstructive sleep apnea) 12/14/2020   Paroxysmal atrial fibrillation ([place]) 07/11/2020   Pain in joint of left shoulder 01/11/2020   Atherosclerosis of abdominal aorta ([place]) 05/04/2019   Arteriosclerotic cardiovascular disease (ASCVD) 07/08/2018   Coronary artery disease involving native coronary artery of native heart 10/02/2017   Benign fibroma of prostate 01/18/2015   Hypercholesteremia 01/18/2015   BP (high blood pressure) 01/18/2015   Osteoarthrosis 01/18/2015   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  It has gone by fast"      PERTINENT HISTORY: Pt seen by neurology this year for evalution for Parkinson like symptoms.  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  Possible neuropathy with paresthesia in bilateral feet with reduced intraepidermal nerve fiber density on distal right thigh found on syn-one test 01/19/2022. History of Obstructive sleep apnea on CPAP, REM behavior disorder, vitamin deficiency, Shingles, A fib.   PRECAUTIONS: Fall  WEIGHT BEARING RESTRICTIONS: No  PAIN:  Are you having pain? No  FALLS: Has patient fallen in last 6 months? Yes. Number of falls 1  LIVING ENVIRONMENT: Lives with: lives with their family and lives with their spouse Lives in: House/apartment Has following equipment at home: None  PLOF: Independent with basic ADLs  PATIENT GOALS: Pt reports he would like to improve his balance and reduce his risk of falls.     HAND DOMINANCE: Right  ADLs: Overall ADLs: modified independent Transfers/ambulation related to ADLs: modified independent, no assistive device at this time Eating: modified independent Grooming: modified independent UB Dressing: modified independent, increased time for buttons, snaps and zippers LB Dressing: modified independent but requires increased time to complete tasks compared to a year ago Toileting: modified independent Bathing: modified independent  Tub Shower transfers: modified independent Equipment: none    IADLs: Shopping: able to complete small purchases, wife does most of the shopping Light housekeeping: able to help around the home with light tasks but wife does most of housekeeping chores. Meal Prep: modified  independent Yardwork:  Pt reports requiring increased assistance for yardwork over time, they now moved to a townhome so he does not have to perform these tasks.   Community mobility: modified independent but reports slower with ambulation in recent months. He is still able to drive.  Medication management: Pt requires reminders for medication  Financial management: modified independent Handwriting: 75% legible Leisure tasks include:  Music, painting and sailing, pt reports he is not able to participate in many of these tasks as he did in the past, especially with sailing and decreased balance.  MOBILITY STATUS: Hx of falls  POSTURE COMMENTS:  rounded shoulders, forwards flexed posture Sitting balance: Sits without UE support up to 30 sec  ACTIVITY TOLERANCE: Activity tolerance: Pt reports increased time to perform tasks, will take rest breaks as needed.  FUNCTIONAL OUTCOME MEASURES: FOTO: 72.88  UPPER EXTREMITY ROM:    Active ROM Right eval Left eval  Shoulder flexion 130 90  Shoulder abduction 128 88  Shoulder adduction    Shoulder extension    Shoulder internal rotation    Shoulder external rotation    Elbow flexion 140 140  Elbow extension 0 0  Wrist flexion    Wrist extension    Wrist ulnar deviation    Wrist radial deviation    Wrist pronation    Wrist supination    (Blank rows = not tested)  UPPER EXTREMITY MMT:     MMT Right eval Left eval  Shoulder flexion 4/5 3-/5  Shoulder abduction 4/5 3-/5  Shoulder adduction    Shoulder extension    Shoulder internal rotation    Shoulder external rotation    Middle trapezius    Lower trapezius    Elbow flexion 4/5 4/5  Elbow extension    Wrist flexion 4/5 4/5  Wrist extension 4/5 4/5  Wrist ulnar deviation    Wrist radial deviation    Wrist pronation    Wrist supination    (Blank rows = not tested)  HAND FUNCTION: Grip strength: Right: 95 lbs; Left: 94 lbs, Lateral pinch: Right: 25 lbs, Left: 17 lbs, and  3 point pinch: Right: 17 lbs, Left: 20 lbs  COORDINATION: 9 Hole Peg test: Right: 28 sec; Left: 34 sec  SENSATION: WFL  EDEMA: none  COGNITION: Overall cognitive status: Within functional limits for tasks assessed, Pt reports difficulty at times recalling names.  Appears to demonstrate slower processing of new information and requires increased time for new learning.  VISION: Subjective report: Reports he had cararacts but were removed. Baseline vision: Wears glasses for reading only Visual history: cataracts  VISION ASSESSMENT: WFL  Patient has difficulty with following activities due to following visual impairments: needs glasses when viewing printed text  PERCEPTION: WFL   ADDITIONAL TESTING at EVAL:   6 minute walk test 790 feet. 10th visit: 995 feet  Observations:  Pt with decreased reciprocal arm swing, decreased step length-tends to have short, shuffled gait, can be easily distracted during task.  Forward flexed posture throughout test.   5 times sit to stand 14 secs, 10 visit: 14 secs.  BERG balance test: 43/56, 10th visit: 46  TODAY'S TREATMENT:  DATE: 04/30/2022  Neuromuscular Reeducation:   Patient seen for instruction of LSVT BIG exercises: LSVT Daily Session Maximal Daily Exercises: Sustained movements are designed to rescale the amplitude of movement output for generalization to daily functional activities. Performed as follows for 1 set of 10 repetitions each: Multi directional sustained movements- 1) Floor to ceiling, 2) Side to side. Multi directional Repetitive movements performed in standing and are designed to provide retraining effort needed for sustained muscle activation in tasks Performed as follows: 3) Step and reach forward, 4) Step and Reach Backwards, 5) Step and reach sideways, 6) Rock and reach forward/backward, 7) Rock and reach  sideways. Sit to stand from mat table on lowest setting with cues for weight shift, technique for 10 reps for 1 set.     Exercises were performed this date in the standard version with min guard to supervision as needed for balance with stepping backwards and during rock and reach.  Able to complete exercises with correct form, cues provided for greater amplitude facilitating BIG movement patterns.  Posture exercises in standing with back against the wall, completing 5 reps of 1 min holds, occasional manual cues/stretch for scapular retraction, head position and stretch to pectoralis muscles with place and hold. Pt able to demonstrate good form this date.  ROM with med playground size ball for shoulder flexion to 90 degrees with scapular retraction and upright posture.   Seated marching for 10 reps on each side, ankle pumps, knee extensions   7  hand exercises to help promote handwriting, dexterity and flexibility. 1)  Fisting with arm then arm extension with fingers extended with BIG hand movements 2) oppositional movements of the thumb to each digit 3) wrist flex/ext with elbows extended 4) supination/pronation 5) tendon gliding with hook fist and then finger extension 6) tendon gliding with tabletop movement of fingers with MP flexion, PIP and DIP extension 7) MP flexion, PIP flexion, DIP extension Performed with therapist demo and cues for form, pt able to recall 5 of 7 hand exercises   Balance:   Activities in standing with weight shifts from right to left then left to right.  Continued focus on single leg stance this date with min guard able to demonstrate for 10 secs on right and 8-10 secs on the left.   Both exercises with 5 reps on each leg, continues with more difficulty with left than right for both exercises.      Functional mobility:  Pt seen for functional mobility for 1000 feet with cues for amplitude of steps, posture when distracted and reciprocal arm swing occasionally.  Continued working towards diminishing pauses with turns.   Continues to demonstrate decreased amplitude of steps and step length when distracted and working towards calibration of movement patterns more consistently.  PATIENT EDUCATION: Education details: Maximal daily exercises, hand exercises, balance tasks. Person educated: Patient and Spouse Education method: Explanation Education comprehension: verbalized understanding  HOME EXERCISE PROGRAM: Maximal daily exercises; seated marches  GOALS: Goals reviewed with patient? Yes  SHORT TERM GOALS: Target date: 04/20/2022  Pt to be able to perform buttons with improved speed and dexterity for dress shirt in less than 1 min. Baseline: increased time, up to 2 min to complete, 10TH VISIT: less than 2 mins but goal not met yet Goal status: PROGRESSING   LONG TERM GOALS: Target date: 05/16/2022  Patient will improve gait speed and endurance to be able to ambulate 1050 feet in 6 minutes to negotiate around the home and community safely in  4 weeks.  Baseline: Eval-6 min walk test 790, 10TH VISIT: 995  Goal status: PROGRESSING  2.  Patient will complete HEP for maximal daily exercises with modified independence in 4 weeks.  Baseline: no current home program related to Parkinsonism symptoms, 10TH VISIT: progressing well, continues to require cues for select exercises and min guard Goal status: PROGRESSING  3.  Patient will transfer from to sit to stand without the use of arms safely and independently from a variety of chairs/surfaces in 4 weeks.  Baseline: difficulty with sit to stand from lower surface heights and occasionally has to use arms to assist. Goal status: MET  4.  Pt will score 74 or greater on FOTO to demonstrate a clinically relevant change to impact independence in ADL and IADL tasks at home and in the community.   Baseline: At eval FOTO: 80 Goal status: MET  5.  Pt will improve BERG balance score by 5 points to demonstrate  improved balance during functional mobility and during ADL and IADL to reduce fall risk.   Baseline: At eval score: 43, 10th visit:  46 Goal status: PROGRESSING    ASSESSMENT:  CLINICAL IMPRESSION: Pt performing well with maximal daily exercises and performing with min guard to supervision for balance.  Pt required 2 cues this date with proper form and technique.  Otherwise he has progressed well with exercises.  Continuing to work towards improving balance with variety of challenges in standing with single leg stance, tandem stance and dynamic stepping patterns.  Utilized balance pad with min assist to maintain balance this date and unable to perform single leg stance on pad.  Continue to work towards goals in plan of care with emphasis on calibration of movement patterns as well as increasing amplitude of movements.    PERFORMANCE DEFICITS: in functional skills including IADLs, coordination, dexterity, proprioception, ROM, strength, flexibility, Fine motor control, Gross motor control, mobility, balance, endurance, and UE functional use, cognitive skills including attention and memory, and psychosocial skills including environmental adaptation, habits, and routines and behaviors.   IMPAIRMENTS: are limiting patient from ADLs, IADLs, leisure, and social participation.   CO-MORBIDITIES: may have co-morbidities  that affects occupational performance. Patient will benefit from skilled OT to address above impairments and improve overall function.  MODIFICATION OR ASSISTANCE TO COMPLETE EVALUATION: No modification of tasks or assist necessary to complete an evaluation.  OT OCCUPATIONAL PROFILE AND HISTORY: Detailed assessment: Review of records and additional review of physical, cognitive, psychosocial history related to current functional performance.  CLINICAL DECISION MAKING: Moderate - several treatment options, min-mod task modification necessary  REHAB POTENTIAL: Excellent  EVALUATION  COMPLEXITY: Low  PLAN:  OT FREQUENCY: 4x/week  OT DURATION: 4 weeks  PLANNED INTERVENTIONS: self care/ADL training, therapeutic exercise, therapeutic activity, neuromuscular re-education, manual therapy, passive range of motion, gait training, balance training, functional mobility training, moist heat, and patient/family education  RECOMMENDED OTHER SERVICES: none at this time  CONSULTED AND AGREED WITH PLAN OF CARE: Patient and family member/caregiver  PLAN FOR NEXT SESSION: Continue with instruction of LSVT BIG maximal daily exercises, balance, ROM, amplitude of movement   Macenzie Burford T Maripaz Mullan, OTR/L, CLT  Nayleen Janosik, OT 05/12/2022, 6:32 PM

## 2022-05-12 NOTE — Therapy (Signed)
OUTPATIENT OCCUPATIONAL THERAPY NEURO TREATMENT/DISCHARGE SUMMARY   Patient Name: Pedro Swanson MRN: [id number] DOB:November 05, 1950, 71 y.o., male Today's Date: 05/03/2022  PCP: Eulis Foster REFERRING PROVIDER: Jennings Books   OT End of Session - 05/12/22 1934     Visit Number 17    Number of Visits 17    Date for OT Re-Evaluation 05/16/22    OT Start Time 1300    OT Stop Time 1401    OT Time Calculation (min) 61 min    Activity Tolerance Patient tolerated treatment well    Behavior During Therapy [facility] for tasks assessed/performed                Past Medical History:  Diagnosis Date   Cataract    COVID    Hyperlipidemia    Hypertension    Palpitations    Shingles    Past Surgical History:  Procedure Laterality Date   CATARACT EXTRACTION     right eye   Patient Active Problem List   Diagnosis Date Noted   OSA (obstructive sleep apnea) 12/14/2020   Paroxysmal atrial fibrillation ([place]) 07/11/2020   Pain in joint of left shoulder 01/11/2020   Atherosclerosis of abdominal aorta ([place]) 05/04/2019   Arteriosclerotic cardiovascular disease (ASCVD) 07/08/2018   Coronary artery disease involving native coronary artery of native heart 10/02/2017   Benign fibroma of prostate 01/18/2015   Hypercholesteremia 01/18/2015   BP (high blood pressure) 01/18/2015   Osteoarthrosis 01/18/2015   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 01/18/2015   Avitaminosis D 01/18/2015    ONSET DATE: 12/04/2021  REFERRING DIAG: Parkinsonism   THERAPY DIAG:  Muscle weakness (generalized)  Other lack of coordination  Unsteadiness on feet  Abnormal posture  Difficulty in walking, not elsewhere classified  Rationale for Evaluation and Treatment: Rehabilitation  SUBJECTIVE:   SUBJECTIVE STATEMENT:  Pt reports he feels he is ready for discharge, pleased with his progress but knows he still needs to work on posture and balance in addition to performing his maximal daily exercises.   He plans to continue with exercises 2 times a day for a while before transitioning to one time a day maintenance.   PERTINENT HISTORY: Pt seen by neurology this year for evalution for Parkinson like symptoms.  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  Possible neuropathy with paresthesia in bilateral feet with reduced intraepidermal nerve fiber density on distal right thigh found on syn-one test 01/19/2022. History of Obstructive sleep apnea on CPAP, REM behavior disorder, vitamin deficiency, Shingles, A fib.   PRECAUTIONS: Fall  WEIGHT BEARING RESTRICTIONS: No  PAIN:  Are you having pain? No  FALLS: Has patient fallen in last 6 months? Yes. Number of falls 1  LIVING ENVIRONMENT: Lives with: lives with their family and lives with their spouse Lives in: House/apartment Has following equipment at home: None  PLOF: Independent with basic ADLs  PATIENT GOALS: Pt reports he would like to improve his balance and reduce his risk of falls.     HAND DOMINANCE: Right  ADLs: Overall ADLs: modified independent Transfers/ambulation related to ADLs: modified independent, no assistive device at this time  Eating: modified independent Grooming: modified independent UB Dressing: modified independent, increased time for buttons, snaps and zippers LB Dressing: modified independent but requires increased time to complete tasks compared to a year ago Toileting: modified independent Bathing: modified independent Tub Shower transfers: modified  independent Equipment: none    IADLs: Shopping: able to complete small purchases, wife does most of the shopping Light housekeeping: able to help around the home with light tasks but wife does most of housekeeping chores. Meal Prep: modified independent Yardwork:  Pt reports requiring increased assistance for yardwork over time, they now moved to a townhome so he does not have to perform these tasks.   Community mobility: modified independent but reports slower with ambulation in recent months. He is still able to drive.  Medication management: Pt requires reminders for medication  Financial management: modified independent Handwriting: 75% legible Leisure tasks include:  Music, painting and sailing, pt reports he is not able to participate in many of these tasks as he did in the past, especially with sailing and decreased balance.  MOBILITY STATUS: Hx of falls  POSTURE COMMENTS:  rounded shoulders, forwards flexed posture Sitting balance: Sits without UE support up to 30 sec  ACTIVITY TOLERANCE: Activity tolerance: Pt reports increased time to perform tasks, will take rest breaks as needed.  FUNCTIONAL OUTCOME MEASURES: FOTO: 72.88 , FOTO at discharge   UPPER EXTREMITY ROM:    Active ROM Right eval Left eval Left  discharge  Shoulder flexion 130 90 108  Shoulder abduction 128 88 90  Shoulder adduction     Shoulder extension     Shoulder internal rotation     Shoulder external rotation     Elbow flexion 140 140 140  Elbow extension 0 0 0  Wrist flexion   WNLs  Wrist extension   WNLs  Wrist ulnar deviation   WNLs  Wrist radial deviation   WNLs  Wrist pronation   WNLs  Wrist supination   WNLs  (Blank rows = not tested)  UPPER EXTREMITY MMT:     MMT Right eval Left eval Left discharge  Shoulder flexion 4/5 3-/5 3/5  Shoulder abduction 4/5 3-/5 3/5  Shoulder adduction     Shoulder extension     Shoulder internal rotation     Shoulder external rotation      Middle trapezius     Lower trapezius     Elbow flexion 4/5 4/5 4/5  Elbow extension     Wrist flexion 4/5 4/5 4/5  Wrist extension 4/5 4/5 4/5  Wrist ulnar deviation     Wrist radial deviation     Wrist pronation     Wrist supination     (Blank rows = not tested)  HAND FUNCTION: Grip strength: Right: 95 lbs; Left: 94 lbs, Lateral pinch: Right: 25 lbs, Left: 17 lbs, and 3 point pinch: Right: 17 lbs, Left: 20 lbs  COORDINATION: 9 Hole Peg test: Right: 28 sec; Left: 34 sec, Discharge Right 26 secs, Left 30 secs  SENSATION: WFL  EDEMA: none  COGNITION: Overall cognitive status: Within functional limits for tasks assessed, Pt reports difficulty at times recalling names.  Appears to demonstrate slower processing of new information and requires increased time for new learning.  VISION: Subjective report: Reports he had cararacts but were removed. Baseline vision: Wears glasses for reading only Visual history: cataracts  VISION ASSESSMENT: WFL  Patient has difficulty with following activities due to following visual impairments: needs glasses when viewing printed text  PERCEPTION: [facility]  ADDITIONAL TESTING at EVAL:   6 minute walk test 790 feet. 10th visit: 995 feet, Discharge: 1190 feet  Observations:  Improved posture and amplitude of steps during test at discharge.  5 times sit to stand 14 secs, 10 visit: 14 secs, Discharge 12 secs BERG balance test: 43/56, 10th visit: 46, Discharge 50/54  TODAY'S TREATMENT:                                                                                                                              DATE: 05/02/2022  Neuromuscular Reeducation:   Patient seen for instruction of LSVT BIG exercises: LSVT Daily Session Maximal Daily Exercises: Sustained movements are designed to rescale the amplitude of movement output for generalization to daily functional activities. Performed as follows for 1 set of 10 repetitions each: Multi directional  sustained movements- 1) Floor to ceiling, 2) Side to side. Multi directional Repetitive movements performed in standing and are designed to provide retraining effort needed for sustained muscle activation in tasks Performed as follows: 3) Step and reach forward, 4) Step and Reach Backwards, 5) Step and reach sideways, 6) Rock and reach forward/backward, 7) Rock and reach sideways. Sit to stand from mat table on lowest setting with cues for weight shift, technique for 10 reps for 1 set.     Exercises were performed this date in the standard version with modified independence. Able to complete exercises with correct form.   Posture exercises in standing with back against the wall, completing 5 reps of 1 min holds, with scapular retraction, head position and stretch to pectoralis muscles with place and hold. Pt able to demonstrate good form.  ROM with med playground size ball for shoulder flexion to 90 degrees with scapular retraction and upright posture, 10 reps   Seated marching for 10 reps on each side, ankle pumps, knee extensions   7  hand exercises to help promote handwriting, dexterity and flexibility. 1)  Fisting with arm then arm extension with fingers extended with BIG hand movements 2) oppositional movements of the thumb to each digit 3) wrist flex/ext with elbows extended 4) supination/pronation 5) tendon gliding with hook fist and then finger extension 6) tendon gliding with tabletop movement of fingers with MP flexion, PIP and DIP extension 7) MP flexion, PIP flexion, DIP extension Performed with therapist demo and cues for form, pt able to recall 7 of 7 hand exercises  Review of HEP with ways to increase complexity over time, hand exercises with pictures and posture exercises.  PATIENT EDUCATION: Education details: Maximal daily exercises, hand exercises, balance tasks. Person educated: Patient Education method: Explanation Education comprehension: verbalized understanding  HOME  EXERCISE PROGRAM: Maximal daily exercises; seated marches  GOALS: Goals reviewed with patient? Yes  SHORT TERM GOALS: Target date: 04/20/2022  Pt to be able to perform buttons with improved speed and dexterity for dress shirt in less than 1 min. Baseline: increased time, up to 2 min to complete, 10TH  VISIT: less than 2 mins but goal not met yet Goal status:  MET   LONG TERM GOALS: Target date: 05/16/2022  Patient will improve gait speed and endurance to be able to ambulate 1050 feet in 6 minutes to negotiate around the home and community safely in 4 weeks.  Baseline: Eval-6 min walk test 790, 10TH VISIT: 995, DISCHARGE:  1190 FEET  Goal status: MET  2.  Patient will complete HEP for maximal daily exercises with modified independence in 4 weeks.  Baseline: no current home program related to Parkinsonism symptoms, 10TH VISIT: progressing well, continues to require cues for select exercises and min guard Goal status: MET  3.  Patient will transfer from to sit to stand without the use of arms safely and independently from a variety of chairs/surfaces in 4 weeks.  Baseline: difficulty with sit to stand from lower surface heights and occasionally has to use arms to assist. Goal status: MET  4.  Pt will score 74 or greater on FOTO to demonstrate a clinically relevant change to impact independence in ADL and IADL tasks at home and in the community.   Baseline: At eval FOTO: 80 Goal status: MET  5.  Pt will improve BERG balance score by 5 points to demonstrate improved balance during functional mobility and during ADL and IADL to reduce fall risk.   Baseline: At eval score: 43, 10th visit:  46, DISCHARGE: 50/54 Goal status: MET    ASSESSMENT:  CLINICAL IMPRESSION: Pt made excellent progress throughout intensive portion of LSVT BIG program.  He met all established goals, demonstrating improvements in ROM strength, coordination, balance and gait.  6 min walk test improved from 790 feet  to 1190 feet , BERG balance improved from 43 to 50.  Pt will continue with maximal daily exercises 1-2 times a day, also has hand exercises, posture and balance tasks as a part of his home program.  He is aware he needs continued work on posture and balance which should improve with daily engagement in exercises and application of BIG principles into daily routine.  Will discharge at this time.     PERFORMANCE DEFICITS: in functional skills including IADLs, coordination, dexterity, proprioception, ROM, strength, flexibility, Fine motor control, Gross motor control, mobility, balance, endurance, and UE functional use, cognitive skills including attention and memory, and psychosocial skills including environmental adaptation, habits, and routines and behaviors.   IMPAIRMENTS: are limiting patient from ADLs, IADLs, leisure, and social participation.   CO-MORBIDITIES: may have co-morbidities  that affects occupational performance. Patient will benefit from skilled OT to address above impairments and improve overall function.  MODIFICATION OR ASSISTANCE TO COMPLETE EVALUATION: No modification of tasks or assist necessary to complete an evaluation.  OT OCCUPATIONAL PROFILE AND HISTORY: Detailed assessment: Review of records and additional review of physical, cognitive, psychosocial history related to current functional performance.  CLINICAL DECISION MAKING: Moderate - several treatment options, min-mod task modification necessary  REHAB POTENTIAL: Excellent  EVALUATION COMPLEXITY: Low  PLAN:  OT FREQUENCY: 4x/week  OT DURATION: 4 weeks  PLANNED INTERVENTIONS: self care/ADL training, therapeutic exercise, therapeutic activity, neuromuscular re-education, manual therapy, passive range of motion, gait training, balance training, functional mobility training, moist heat, and patient/family education  RECOMMENDED OTHER SERVICES: none at this time  CONSULTED AND AGREED WITH PLAN OF CARE: Patient  and family member/caregiver     Mussa Groesbeck Oneita Jolly, OTR/L, CLT  Micki Cassel, OT 05/12/2022, 7:35 PM

## 2022-07-25 ENCOUNTER — Ambulatory Visit (INDEPENDENT_AMBULATORY_CARE_PROVIDER_SITE_OTHER): Payer: Medicare HMO

## 2022-07-25 VITALS — Ht 70.0 in | Wt 178.0 lb

## 2022-07-25 DIAGNOSIS — Z0000 Encounter for general adult medical examination without abnormal findings: Secondary | ICD-10-CM | POA: Diagnosis not present

## 2022-07-25 NOTE — Progress Notes (Addendum)
Complete physical exam   Patient: Pedro Swanson   DOB: [birth year]   72 y.o. Male  MRN: [id number] Visit Date: 07/26/2022  Today's healthcare provider: Ronnald Ramp, MD   Chief Complaint  Patient presents with   Annual Exam   Subjective    Pedro Swanson is a 72 y.o. male who presents today for a complete physical exam.   He reports consuming a  consists of decreased meat, more beans for protein  diet.  He reports that he goes to the [facility]   He generally feels well. He reports sleeping fairly well. He does not have additional problems to discuss today.    He wears cpap 4 nights per week and takes nap   He reports that he has been walking slowly. He has been able to go to specialized PT that helped ot strengthen his legs.   Patient presents today for annual exam. Patient declines further COVID vaccines at this time.      Patient had AWV 07/25/2022  Past Medical History:  Diagnosis Date   Cataract    COVID    Hyperlipidemia    Hypertension    Palpitations    Shingles    Past Surgical History:  Procedure Laterality Date   CATARACT EXTRACTION Bilateral    Social History   Socioeconomic History   Marital status: Married    Spouse name: Not on file   Number of children: 2   Years of education: Not on file   Highest education level: Bachelor's degree (e.g., BA, AB, BS)  Occupational History   Occupation: retired  Tobacco Use   Smoking status: Former    Packs/day: 1.00    Years: 44.00    Additional pack years: 0.00    Total pack years: 44.00    Types: Cigarettes    Quit date: 2012    Years since quitting: 12.3   Smokeless tobacco: Never  Vaping Use   Vaping Use: Never used  Substance and Sexual Activity   Alcohol use: Yes    Alcohol/week: 7.0 standard drinks of alcohol    Types: 7 Shots of liquor per week    Comment: half drink daily   Drug use: No   Sexual activity: Not on file  Other Topics Concern   Not on file  Social  History Narrative   Not on file   Social Determinants of Health   Financial Resource Strain: Low Risk  (07/25/2022)   Overall Financial Resource Strain (CARDIA)    Difficulty of Paying Living Expenses: Not hard at all  Food Insecurity: No Food Insecurity (07/25/2022)   Hunger Vital Sign    Worried About Running Out of Food in the Last Year: Never true    Ran Out of Food in the Last Year: Never true  Transportation Needs: No Transportation Needs (07/25/2022)   PRAPARE - Administrator, Civil Service (Medical): No    Lack of Transportation (Non-Medical): No  Physical Activity: Sufficiently Active (07/25/2022)   Exercise Vital Sign    Days of Exercise per Week: 5 days    Minutes of Exercise per Session: 60 min  Stress: No Stress Concern Present (07/25/2022)   [employer] of Occupational Health - Occupational Stress Questionnaire    Feeling of Stress : Not at all  Social Connections: Moderately Isolated (07/25/2022)   Social Connection and Isolation Panel [NHANES]    Frequency of Communication with Friends and Family: More than three times a week  Frequency of Social Gatherings with Friends and Family: More than three times a week    Attends Religious Services: Never    Marine scientist or Organizations: No    Attends Archivist Meetings: Never    Marital Status: Married  Human resources officer Violence: Not At Risk (07/25/2022)   Humiliation, Afraid, Rape, and Kick questionnaire    Fear of Current or Ex-Partner: No    Emotionally Abused: No    Physically Abused: No    Sexually Abused: No   Family Status  Relation Name Status   Mother  Deceased   Father  Deceased   Brother  Deceased   Daughter  Alive   Son  Alive   Brother  Alive   Family History  Problem Relation Age of Onset   CAD Father    Hypertension Father    No Known Allergies  Patient Care Team: Eulis Foster, MD as PCP - General (Family Medicine) Corey Skains, MD as  Consulting Physician (Cardiology) Vladimir Crofts, MD as Consulting Physician (Neurology) Germaine Pomfret, [facility] (Pharmacist)   Medications: Outpatient Medications Prior to Visit  Medication Sig   ascorbic acid (VITAMIN C) 500 MG tablet Take 500 mg by mouth daily.   Cholecalciferol 25 MCG (1000 UT) capsule Take 1,000 Units by mouth daily.    cyanocobalamin 1000 MCG tablet Take 1,000 mcg by mouth daily.   ELIQUIS 5 MG TABS tablet Take 5 mg by mouth 2 (two) times daily.   flecainide (TAMBOCOR) 100 MG tablet Take 100 mg by mouth 2 (two) times daily.   metoprolol tartrate (LOPRESSOR) 25 MG tablet Take 1 tablet by mouth 2 (two) times daily.   rosuvastatin (CRESTOR) 20 MG tablet Take 1 tablet (20 mg total) by mouth daily.   telmisartan (MICARDIS) 80 MG tablet Take 1 tablet by mouth daily.   [DISCONTINUED] flecainide (TAMBOCOR) 50 MG tablet Take 50 mg by mouth 2 (two) times daily.   Facility-Administered Medications Prior to Visit  Medication Dose Route Frequency Provider   aspirin chewable tablet 81 mg  81 mg Oral Once Eulas Post, MD    Review of Systems  All other systems reviewed and are negative.      Objective    BP 116/66   Pulse (!) 56   Ht 5\' 10"  (1.778 m)   Wt 180 lb (81.6 kg)   SpO2 98%   BMI 25.83 kg/m      Physical Exam Vitals reviewed.  Constitutional:      General: He is not in acute distress.    Appearance: Normal appearance. He is not ill-appearing, toxic-appearing or diaphoretic.  Eyes:     Conjunctiva/sclera: Conjunctivae normal.  Neck:     Thyroid: No thyroid mass, thyromegaly or thyroid tenderness.     Vascular: No carotid bruit.  Cardiovascular:     Rate and Rhythm: Normal rate and regular rhythm.     Pulses: Normal pulses.     Heart sounds: Normal heart sounds. No murmur heard.    No friction rub. No gallop.  Pulmonary:     Effort: Pulmonary effort is normal. No respiratory distress.     Breath sounds: Normal breath sounds. No stridor.  No wheezing, rhonchi or rales.  Abdominal:     General: Bowel sounds are normal. There is no distension.     Palpations: Abdomen is soft.     Tenderness: There is no abdominal tenderness.  Musculoskeletal:     Right lower leg: No edema.  Left lower leg: No edema.  Lymphadenopathy:     Cervical: No cervical adenopathy.  Skin:    Findings: No erythema or rash.  Neurological:     Mental Status: He is alert and oriented to person, place, and time.       Last depression screening scores    07/25/2022   11:32 AM 07/20/2021   10:13 AM 07/14/2020    9:23 AM  PHQ 2/9 Scores  PHQ - 2 Score 0 0 0   Last fall risk screening    07/25/2022   11:26 AM  Fall Risk   Falls in the past year? 0  Number falls in past yr: 0  Injury with Fall? 0  Risk for fall due to : No Fall Risks  Follow up Education provided;Falls prevention discussed   Last Audit-C alcohol use screening    07/25/2022   11:31 AM  Alcohol Use Disorder Test (AUDIT)  1. How often do you have a drink containing alcohol? 0  2. How many drinks containing alcohol do you have on a typical day when you are drinking? 0  3. How often do you have six or more drinks on one occasion? 0  AUDIT-C Score 0   A score of 3 or more in women, and 4 or more in men indicates increased risk for alcohol abuse, EXCEPT if all of the points are from question 1   Results for orders placed or performed in visit on 07/26/22  Comprehensive metabolic panel  Result Value Ref Range   Glucose 101 (H) 70 - 99 mg/dL   BUN 15 8 - 27 mg/dL   Creatinine, Ser 1.61 (H) 0.76 - 1.27 mg/dL   eGFR 55 (L) >09 UE/AVW/0.98   BUN/Creatinine Ratio 11 10 - 24   Sodium 139 134 - 144 mmol/L   Potassium 5.3 (H) 3.5 - 5.2 mmol/L   Chloride 103 96 - 106 mmol/L   CO2 22 20 - 29 mmol/L   Calcium 9.7 8.6 - 10.2 mg/dL   Total Protein 6.5 6.0 - 8.5 g/dL   Albumin 4.7 3.8 - 4.8 g/dL   Globulin, Total 1.8 1.5 - 4.5 g/dL   Albumin/Globulin Ratio 2.6 (H) 1.2 - 2.2    Bilirubin Total 0.5 0.0 - 1.2 mg/dL   Alkaline Phosphatase 54 44 - 121 IU/L   AST 15 0 - 40 IU/L   ALT 17 0 - 44 IU/L  Lipid panel  Result Value Ref Range   Cholesterol, Total 148 100 - 199 mg/dL   Triglycerides 99 0 - 149 mg/dL   HDL 51 >11 mg/dL   VLDL Cholesterol Cal 18 5 - 40 mg/dL   LDL Chol Calc (NIH) 79 0 - 99 mg/dL   Chol/HDL Ratio 2.9 0.0 - 5.0 ratio  PSA  Result Value Ref Range   Prostate Specific Ag, Serum 2.0 0.0 - 4.0 ng/mL    Assessment & Plan    Routine Health Maintenance and Physical Exam  Exercise Activities and Dietary recommendations  Goals      DIET - EAT MORE FRUITS AND VEGETABLES     DIET - INCREASE WATER INTAKE     Recommend to drink at least 6-8 8oz glasses of water per day.     Track and Manage My Blood Pressure-Hypertension     Timeframe:  Long-Range Goal Priority:  High Start Date: 07/24/2021  Expected End Date: 07/25/2022                      Follow Up within 90 days   - check blood pressure weekly    Why is this important?   You won't feel high blood pressure, but it can still hurt your blood vessels.  High blood pressure can cause heart or kidney problems. It can also cause a stroke.  Making lifestyle changes like losing a little weight or eating less salt will help.  Checking your blood pressure at home and at different times of the day can help to control blood pressure.  If the doctor prescribes medicine remember to take it the way the doctor ordered.  Call the office if you cannot afford the medicine or if there are questions about it.     Notes:         Immunization History  Administered Date(s) Administered   Fluad Quad(high Dose 65+) 02/16/2019, 03/21/2020, 02/22/2021, 04/04/2022   Influenza, High Dose Seasonal PF 06/12/2017, 05/06/2018   PFIZER(Purple Top)SARS-COV-2 Vaccination 06/11/2019, 07/09/2019   Pneumococcal Polysaccharide-23 02/16/2019   Tdap 03/31/2008   Zoster, Live 11/04/2012     Health Maintenance  Topic Date Due   Zoster Vaccines- Shingrix (1 of 2) Never done   Pneumonia Vaccine 25+ Years old (2 of 2 - PCV) 02/16/2020   INFLUENZA VACCINE  12/13/2022   Fecal DNA (Cologuard)  06/28/2023   Medicare Annual Wellness (AWV)  07/25/2023   Lung Cancer Screening  08/17/2023   Hepatitis C Screening  Completed   HPV VACCINES  Aged Out   DTaP/Tdap/Td  Discontinued   COVID-19 Vaccine  Discontinued      Problem List Items Addressed This Visit       Cardiovascular and Mediastinum   BP (high blood pressure)    Controlled BP at goal Continue current medications at current doses, telmisartan 80mg  daily  No medications changes today        Relevant Medications   flecainide (TAMBOCOR) 100 MG tablet   Other Relevant Orders   Comprehensive metabolic panel (Completed)     Other   Hypercholesteremia    Chronic  Continue current medication, crestor 20mg  daily  Lipid panel ordered today        Relevant Medications   flecainide (TAMBOCOR) 100 MG tablet   Other Relevant Orders   Lipid panel (Completed)   PE (physical exam), annual - Primary    Chronic conditions stable  Age appropriate vaccines recommended  Labs ordered including CMP, PSA, Lipid panel  Follow up in 1 year for annual physical exam  Advised patient to participate in regular physical activity for cardiovascular and bone health benefits         Relevant Orders   Comprehensive metabolic panel (Completed)   Lipid panel (Completed)   PSA (Completed)   Special screening, prostate cancer    PSA ordered today       Relevant Orders   PSA (Completed)     Return in about 6 months (around 01/26/2023) for chronic conditions.       The entirety of the information documented in the History of Present Illness, Review of Systems and Physical Exam were personally obtained by me. Portions of this information were initially documented by Dorna Mai, CMA . I, Ronnald Ramp, MD have  reviewed the documentation above for thoroughness and accuracy.      Ronnald Ramp, MD  [facility] [phone] (phone) [phone] (fax)   Medical Group  

## 2022-07-25 NOTE — Progress Notes (Signed)
I connected with  Pedro Swanson on 07/25/22 by a audio enabled telemedicine application and verified that I am speaking with the correct person using two identifiers.  Patient Location: Home  Provider Location: Office/Clinic  I discussed the limitations of evaluation and management by telemedicine. The patient expressed understanding and agreed to proceed.  Subjective:   Pedro Swanson is a 72 y.o. male who presents for Medicare Annual/Subsequent preventive examination.  Review of Systems    Cardiac Risk Factors include: advanced age (>65mn, >>71women);dyslipidemia;hypertension;male gender     Objective:    Today's Vitals   07/25/22 1123  Weight: 178 lb (80.7 kg)  Height: '5\' 10"'$  (1.778 m)   Body mass index is 25.54 kg/m.     07/25/2022   11:38 AM 07/20/2021   10:16 AM 07/14/2020    9:29 AM 07/14/2019    8:48 AM 07/07/2018    9:44 AM 07/30/2016    2:51 PM  Advanced Directives  Does Patient Have a Medical Advance Directive? No No No No No No  Would patient like information on creating a medical advance directive?  No - Patient declined No - Patient declined No - Patient declined No - Patient declined     Current Medications (verified) Outpatient Encounter Medications as of 07/25/2022  Medication Sig   ascorbic acid (VITAMIN C) 500 MG tablet Take 500 mg by mouth daily.   Cholecalciferol 25 MCG (1000 UT) capsule Take 1,000 Units by mouth daily.    cyanocobalamin 1000 MCG tablet Take 1,000 mcg by mouth daily.   ELIQUIS 5 MG TABS tablet Take 5 mg by mouth 2 (two) times daily.   flecainide (TAMBOCOR) 50 MG tablet Take 50 mg by mouth 2 (two) times daily.   rosuvastatin (CRESTOR) 20 MG tablet Take 1 tablet (20 mg total) by mouth daily.   telmisartan (MICARDIS) 80 MG tablet Take 1 tablet by mouth daily.   metoprolol tartrate (LOPRESSOR) 25 MG tablet Take 1 tablet by mouth 2 (two) times daily.   Facility-Administered Encounter Medications as of 07/25/2022  Medication    aspirin chewable tablet 81 mg    Allergies (verified) Patient has no known allergies.   History: Past Medical History:  Diagnosis Date   Cataract    COVID    Hyperlipidemia    Hypertension    Palpitations    Shingles    Past Surgical History:  Procedure Laterality Date   CATARACT EXTRACTION     right eye   Family History  Problem Relation Age of Onset   CAD Father    Hypertension Father    Social History   Socioeconomic History   Marital status: Married    Spouse name: Not on file   Number of children: 2   Years of education: Not on file   Highest education level: Bachelor's degree (e.g., BA, AB, BS)  Occupational History   Occupation: retired  Tobacco Use   Smoking status: Former    Packs/day: 1.00    Years: 44.00    Total pack years: 44.00    Types: Cigarettes    Quit date: 2012    Years since quitting: 12.2   Smokeless tobacco: Never  Vaping Use   Vaping Use: Never used  Substance and Sexual Activity   Alcohol use: Yes    Alcohol/week: 7.0 standard drinks of alcohol    Types: 7 Shots of liquor per week    Comment: half drink daily   Drug use: No   Sexual activity: Not  on file  Other Topics Concern   Not on file  Social History Narrative   Not on file   Social Determinants of Health   Financial Resource Strain: Low Risk  (07/25/2022)   Overall Financial Resource Strain (CARDIA)    Difficulty of Paying Living Expenses: Not hard at all  Food Insecurity: No Food Insecurity (07/25/2022)   Hunger Vital Sign    Worried About Running Out of Food in the Last Year: Never true    Ran Out of Food in the Last Year: Never true  Transportation Needs: No Transportation Needs (07/25/2022)   PRAPARE - Hydrologist (Medical): No    Lack of Transportation (Non-Medical): No  Physical Activity: Sufficiently Active (07/25/2022)   Exercise Vital Sign    Days of Exercise per Week: 5 days    Minutes of Exercise per Session: 60 min  Stress:  No Stress Concern Present (07/25/2022)   [place]    Feeling of Stress : Not at all  Social Connections: Moderately Isolated (07/25/2022)   Social Connection and Isolation Panel [NHANES]    Frequency of Communication with Friends and Family: More than three times a week    Frequency of Social Gatherings with Friends and Family: More than three times a week    Attends Religious Services: Never    Marine scientist or Organizations: No    Attends Music therapist: Never    Marital Status: Married    Tobacco Counseling Counseling given: Not Answered   Clinical Intake:  Pre-visit preparation completed: Yes  Pain : No/denies pain     BMI - recorded: 25.54 Nutritional Status: BMI 25 -29 Overweight Nutritional Risks: None Diabetes: No  How often do you need to have someone help you when you read instructions, pamphlets, or other written materials from your doctor or pharmacy?: 1 - Never  Diabetic?no  Interpreter Needed?: No  Comments: lives w/wife Information entered by :: B.Harjot Dibello,LPN   Activities of Daily Living    07/25/2022   11:39 AM  In your present state of health, do you have any difficulty performing the following activities:  Hearing? 0  Vision? 0  Difficulty concentrating or making decisions? 1  Comment little slower but alright  Walking or climbing stairs? 0  Dressing or bathing? 0  Doing errands, shopping? 0  Preparing Food and eating ? N  Using the Toilet? N  In the past six months, have you accidently leaked urine? Y  Do you have problems with loss of bowel control? N  Managing your Medications? N  Managing your Finances? N  Housekeeping or managing your Housekeeping? N    Patient Care Team: Eulis Foster, MD as PCP - General (Family Medicine) Corey Skains, MD as Consulting Physician (Cardiology) Vladimir Crofts, MD as Consulting Physician  (Neurology) Germaine Pomfret, [facility] (Pharmacist)  Indicate any recent Medical Services you may have received from other than [facility] providers in the past year (date may be approximate).     Assessment:   This is a routine wellness examination for Pedro Swanson.  Hearing/Vision screen Hearing Screening - Comments:: Adequate hearing Vision Screening - Comments:: Adequate vision after cataract surgery;readers only [facility]  Dietary issues and exercise activities discussed: Current Exercise Habits: Structured exercise class, Type of exercise: walking;strength training/weights;stretching;treadmill;calisthenics, Time (Minutes): 60, Frequency (Times/Week): 7, Weekly Exercise (Minutes/Week): 420, Intensity: Moderate, Exercise limited by: None identified   Goals Addressed  This Visit's Progress    DIET - EAT MORE FRUITS AND VEGETABLES   On track    DIET - INCREASE WATER INTAKE   Not on track    Recommend to drink at least 6-8 8oz glasses of water per day.     Track and Manage My Blood Pressure-Hypertension   On track    Timeframe:  Long-Range Goal Priority:  High Start Date: 07/24/2021                            Expected End Date: 07/25/2022                      Follow Up within 90 days   - check blood pressure weekly    Why is this important?   You won't feel high blood pressure, but it can still hurt your blood vessels.  High blood pressure can cause heart or kidney problems. It can also cause a stroke.  Making lifestyle changes like losing a little weight or eating less salt will help.  Checking your blood pressure at home and at different times of the day can help to control blood pressure.  If the doctor prescribes medicine remember to take it the way the doctor ordered.  Call the office if you cannot afford the medicine or if there are questions about it.     Notes:        Depression Screen    07/25/2022   11:32 AM 07/20/2021   10:13 AM 07/14/2020    9:23 AM  03/21/2020    8:20 AM 07/14/2019    8:41 AM 07/07/2018   10:01 AM 07/07/2018    9:44 AM  PHQ 2/9 Scores  PHQ - 2 Score 0 0 0 0 0 0 0  PHQ- 9 Score    0  2     Fall Risk    07/25/2022   11:26 AM 07/20/2021   10:17 AM 07/14/2020    9:17 AM 03/21/2020    8:19 AM 07/14/2019    8:48 AM  Fall Risk   Falls in the past year? 0 0 0 0 0  Number falls in past yr: 0 0 0 0 0  Injury with Fall? 0 0 0 0 0  Risk for fall due to : No Fall Risks No Fall Risks     Follow up Education provided;Falls prevention discussed Falls evaluation completed  Falls evaluation completed     FALL RISK PREVENTION PERTAINING TO THE HOME:  Any stairs in or around the home? Yes  If so, are there any without handrails? Yes  Home free of loose throw rugs in walkways, pet beds, electrical cords, etc? Yes  Adequate lighting in your home to reduce risk of falls? Yes   ASSISTIVE DEVICES UTILIZED TO PREVENT FALLS:  Life alert? No  Use of a cane, walker or w/c? No  Grab bars in the bathroom? Yes  Shower chair or bench in shower? Yes  Elevated toilet seat or a handicapped toilet? Yes    Cognitive Function:        07/25/2022   11:43 AM 06/12/2017    9:24 AM  6CIT Screen  What Year? 0 points 0 points  What month? 0 points 0 points  What time? 0 points 0 points  Count back from 20 0 points 0 points  Months in reverse 0 points 0 points  Repeat phrase 0 points 6 points  Total Score 0 points 6 points    Immunizations Immunization History  Administered Date(s) Administered   Fluad Quad(high Dose 65+) 02/16/2019, 03/21/2020, 02/22/2021, 04/04/2022   Influenza, High Dose Seasonal PF 06/12/2017, 05/06/2018   PFIZER(Purple Top)SARS-COV-2 Vaccination 06/11/2019, 07/09/2019   Pneumococcal Polysaccharide-23 02/16/2019   Tdap 03/31/2008   Zoster, Live 11/04/2012    TDAP status: Up to date  Flu Vaccine status: Up to date  Pneumococcal vaccine status: Up to date  Covid-19 vaccine status: Completed vaccines  Qualifies  for Shingles Vaccine? Yes   Zostavax completed Yes   Shingrix Completed?: Yes  Screening Tests Health Maintenance  Topic Date Due   Zoster Vaccines- Shingrix (1 of 2) Never done   DTaP/Tdap/Td (2 - Td or Tdap) 03/31/2018   COVID-19 Vaccine (3 - Pfizer risk series) 08/06/2019   Pneumonia Vaccine 37+ Years old (2 of 2 - PCV) 02/16/2020   Lung Cancer Screening  08/16/2022   Fecal DNA (Cologuard)  06/28/2023   Medicare Annual Wellness (AWV)  07/25/2023   INFLUENZA VACCINE  Completed   Hepatitis C Screening  Completed   HPV VACCINES  Aged Out    Health Maintenance  Health Maintenance Due  Topic Date Due   Zoster Vaccines- Shingrix (1 of 2) Never done   DTaP/Tdap/Td (2 - Td or Tdap) 03/31/2018   COVID-19 Vaccine (3 - Pfizer risk series) 08/06/2019   Pneumonia Vaccine 59+ Years old (2 of 2 - PCV) 02/16/2020   Lung Cancer Screening  08/16/2022    Colorectal cancer screening: Type of screening: Cologuard. Completed yes. Repeat every 3 years  Lung Cancer Screening: (Low Dose CT Chest recommended if Age 41-80 years, 30 pack-year currently smoking OR have quit w/in 15years.) does not qualify.   Lung Cancer Screening Referral: due 08/16/22  Additional Screening:  Hepatitis C Screening: does not qualify; Completed yes  Vision Screening: Recommended annual ophthalmology exams for early detection of glaucoma and other disorders of the eye. Is the patient up to date with their annual eye exam?  Yes  Who is the provider or what is the name of the office in which the patient attends annual eye exams? [facility] If pt is not established with a provider, would they like to be referred to a provider to establish care? No .   Dental Screening: Recommended annual dental exams for proper oral hygiene  Community Resource Referral / Chronic Care Management: CRR required this visit?  No   CCM required this visit?  No      Plan:     I have personally reviewed and noted the following  in the patient's chart:   Medical and social history Use of alcohol, tobacco or illicit drugs  Current medications and supplements including opioid prescriptions. Patient is not currently taking opioid prescriptions. Functional ability and status Nutritional status Physical activity Advanced directives List of other physicians Hospitalizations, surgeries, and ER visits in previous 12 months Vitals Screenings to include cognitive, depression, and falls Referrals and appointments  In addition, I have reviewed and discussed with patient certain preventive protocols, quality metrics, and best practice recommendations. A written personalized care plan for preventive services as well as general preventive health recommendations were provided to patient.     Roger Shelter, LPN   [id number]   Nurse Notes: pt is doing well as he continues to be active everyday at [facility] and monitors his BP daily.Pt has no concerns or questions for this visit. Pt is scheduled to see PCP tomorrow and is  cognizant.

## 2022-07-25 NOTE — Patient Instructions (Signed)
Mr. Pedro Swanson , Thank you for taking time to come for your Medicare Wellness Visit. I appreciate your ongoing commitment to your health goals. Please review the following plan we discussed and let me know if I can assist you in the future.   These are the goals we discussed:  Goals      DIET - EAT MORE FRUITS AND VEGETABLES     DIET - INCREASE WATER INTAKE     Recommend to drink at least 6-8 8oz glasses of water per day.     Track and Manage My Blood Pressure-Hypertension     Timeframe:  Long-Range Goal Priority:  High Start Date: 07/24/2021                            Expected End Date: 07/25/2022                      Follow Up within 90 days   - check blood pressure weekly    Why is this important?   You won't feel high blood pressure, but it can still hurt your blood vessels.  High blood pressure can cause heart or kidney problems. It can also cause a stroke.  Making lifestyle changes like losing a little weight or eating less salt will help.  Checking your blood pressure at home and at different times of the day can help to control blood pressure.  If the doctor prescribes medicine remember to take it the way the doctor ordered.  Call the office if you cannot afford the medicine or if there are questions about it.     Notes:         This is a list of the screening recommended for you and due dates:  Health Maintenance  Topic Date Due   Zoster (Shingles) Vaccine (1 of 2) Never done   DTaP/Tdap/Td vaccine (2 - Td or Tdap) 03/31/2018   COVID-19 Vaccine (3 - Pfizer risk series) 08/06/2019   Pneumonia Vaccine (2 of 2 - PCV) 02/16/2020   Screening for Lung Cancer  08/16/2022   Cologuard (Stool DNA test)  06/28/2023   Medicare Annual Wellness Visit  07/25/2023   Flu Shot  Completed   Hepatitis C Screening: USPSTF Recommendation to screen - Ages 18-79 yo.  Completed   HPV Vaccine  Aged Out    Advanced directives: no  Conditions/risks identified: none  Next appointment:  Follow up in one year for your annual wellness visit. 07/30/2023'@9'$ :45am telephone  Preventive Care 74 Years and Older, Male  Preventive care refers to lifestyle choices and visits with your health care provider that can promote health and wellness. What does preventive care include? A yearly physical exam. This is also called an annual well check. Dental exams once or twice a year. Routine eye exams. Ask your health care provider how often you should have your eyes checked. Personal lifestyle choices, including: Daily care of your teeth and gums. Regular physical activity. Eating a healthy diet. Avoiding tobacco and drug use. Limiting alcohol use. Practicing safe sex. Taking low doses of aspirin every day. Taking vitamin and mineral supplements as recommended by your health care provider. What happens during an annual well check? The services and screenings done by your health care provider during your annual well check will depend on your age, overall health, lifestyle risk factors, and family history of disease. Counseling  Your health care provider may ask you questions about your:  Alcohol use. Tobacco use. Drug use. Emotional well-being. Home and relationship well-being. Sexual activity. Eating habits. History of falls. Memory and ability to understand (cognition). Work and work Statistician. Screening  You may have the following tests or measurements: Height, weight, and BMI. Blood pressure. Lipid and cholesterol levels. These may be checked every 5 years, or more frequently if you are over 69 years old. Skin check. Lung cancer screening. You may have this screening every year starting at age 74 if you have a 30-pack-year history of smoking and currently smoke or have quit within the past 15 years. Fecal occult blood test (FOBT) of the stool. You may have this test every year starting at age 74. Flexible sigmoidoscopy or colonoscopy. You may have a sigmoidoscopy every 5  years or a colonoscopy every 10 years starting at age 74. Prostate cancer screening. Recommendations will vary depending on your family history and other risks. Hepatitis C blood test. Hepatitis B blood test. Sexually transmitted disease (STD) testing. Diabetes screening. This is done by checking your blood sugar (glucose) after you have not eaten for a while (fasting). You may have this done every 1-3 years. Abdominal aortic aneurysm (AAA) screening. You may need this if you are a current or former smoker. Osteoporosis. You may be screened starting at age 74 if you are at high risk. Talk with your health care provider about your test results, treatment options, and if necessary, the need for more tests. Vaccines  Your health care provider may recommend certain vaccines, such as: Influenza vaccine. This is recommended every year. Tetanus, diphtheria, and acellular pertussis (Tdap, Td) vaccine. You may need a Td booster every 10 years. Zoster vaccine. You may need this after age 74. Pneumococcal 13-valent conjugate (PCV13) vaccine. One dose is recommended after age 74. Pneumococcal polysaccharide (PPSV23) vaccine. One dose is recommended after age 74. Talk to your health care provider about which screenings and vaccines you need and how often you need them. This information is not intended to replace advice given to you by your health care provider. Make sure you discuss any questions you have with your health care provider. Document Released: 05/27/2015 Document Revised: 01/18/2016 Document Reviewed: 03/01/2015 Elsevier Interactive Patient Education  2017 [place] Prevention in the Home Falls can cause injuries. They can happen to people of all ages. There are many things you can do to make your home safe and to help prevent falls. What can I do on the outside of my home? Regularly fix the edges of walkways and driveways and fix any cracks. Remove anything that might make you  trip as you walk through a door, such as a raised step or threshold. Trim any bushes or trees on the path to your home. Use bright outdoor lighting. Clear any walking paths of anything that might make someone trip, such as rocks or tools. Regularly check to see if handrails are loose or broken. Make sure that both sides of any steps have handrails. Any raised decks and porches should have guardrails on the edges. Have any leaves, snow, or ice cleared regularly. Use sand or salt on walking paths during winter. Clean up any spills in your garage right away. This includes oil or grease spills. What can I do in the bathroom? Use night lights. Install grab bars by the toilet and in the tub and shower. Do not use towel bars as grab bars. Use non-skid mats or decals in the tub or shower. If you need to sit  down in the shower, use a plastic, non-slip stool. Keep the floor dry. Clean up any water that spills on the floor as soon as it happens. Remove soap buildup in the tub or shower regularly. Attach bath mats securely with double-sided non-slip rug tape. Do not have throw rugs and other things on the floor that can make you trip. What can I do in the bedroom? Use night lights. Make sure that you have a light by your bed that is easy to reach. Do not use any sheets or blankets that are too big for your bed. They should not hang down onto the floor. Have a firm chair that has side arms. You can use this for support while you get dressed. Do not have throw rugs and other things on the floor that can make you trip. What can I do in the kitchen? Clean up any spills right away. Avoid walking on wet floors. Keep items that you use a lot in easy-to-reach places. If you need to reach something above you, use a strong step stool that has a grab bar. Keep electrical cords out of the way. Do not use floor polish or wax that makes floors slippery. If you must use wax, use non-skid floor wax. Do not have  throw rugs and other things on the floor that can make you trip. What can I do with my stairs? Do not leave any items on the stairs. Make sure that there are handrails on both sides of the stairs and use them. Fix handrails that are broken or loose. Make sure that handrails are as long as the stairways. Check any carpeting to make sure that it is firmly attached to the stairs. Fix any carpet that is loose or worn. Avoid having throw rugs at the top or bottom of the stairs. If you do have throw rugs, attach them to the floor with carpet tape. Make sure that you have a light switch at the top of the stairs and the bottom of the stairs. If you do not have them, ask someone to add them for you. What else can I do to help prevent falls? Wear shoes that: Do not have high heels. Have rubber bottoms. Are comfortable and fit you well. Are closed at the toe. Do not wear sandals. If you use a stepladder: Make sure that it is fully opened. Do not climb a closed stepladder. Make sure that both sides of the stepladder are locked into place. Ask someone to hold it for you, if possible. Clearly mark and make sure that you can see: Any grab bars or handrails. First and last steps. Where the edge of each step is. Use tools that help you move around (mobility aids) if they are needed. These include: Canes. Walkers. Scooters. Crutches. Turn on the lights when you go into a dark area. Replace any light bulbs as soon as they burn out. Set up your furniture so you have a clear path. Avoid moving your furniture around. If any of your floors are uneven, fix them. If there are any pets around you, be aware of where they are. Review your medicines with your doctor. Some medicines can make you feel dizzy. This can increase your chance of falling. Ask your doctor what other things that you can do to help prevent falls. This information is not intended to replace advice given to you by your health care provider.  Make sure you discuss any questions you have with your health care provider.  Document Released: 02/24/2009 Document Revised: 10/06/2015 Document Reviewed: 06/04/2014 Elsevier Interactive Patient Education  2017 [employer].

## 2022-07-26 ENCOUNTER — Encounter: Payer: Self-pay | Admitting: Family Medicine

## 2022-07-26 ENCOUNTER — Ambulatory Visit (INDEPENDENT_AMBULATORY_CARE_PROVIDER_SITE_OTHER): Payer: Medicare HMO | Admitting: Family Medicine

## 2022-07-26 VITALS — BP 116/66 | HR 56 | Ht 70.0 in | Wt 180.0 lb

## 2022-07-26 DIAGNOSIS — Z0000 Encounter for general adult medical examination without abnormal findings: Secondary | ICD-10-CM | POA: Diagnosis not present

## 2022-07-26 DIAGNOSIS — E7800 Pure hypercholesterolemia, unspecified: Secondary | ICD-10-CM | POA: Diagnosis not present

## 2022-07-26 DIAGNOSIS — I10 Essential (primary) hypertension: Secondary | ICD-10-CM

## 2022-07-26 DIAGNOSIS — Z125 Encounter for screening for malignant neoplasm of prostate: Secondary | ICD-10-CM

## 2022-07-26 NOTE — Patient Instructions (Signed)
Health Maintenance After Age 74 After age 74, you are at a higher risk for certain long-term diseases and infections as well as injuries from falls. Falls are a major cause of broken bones and head injuries in people who are older than age 74. Getting regular preventive care can help to keep you healthy and well. Preventive care includes getting regular testing and making lifestyle changes as recommended by your health care provider. Talk with your health care provider about: Which screenings and tests you should have. A screening is a test that checks for a disease when you have no symptoms. A diet and exercise plan that is right for you. What should I know about screenings and tests to prevent falls? Screening and testing are the best ways to find a health problem early. Early diagnosis and treatment give you the best chance of managing medical conditions that are common after age 74. Certain conditions and lifestyle choices may make you more likely to have a fall. Your health care provider may recommend: Regular vision checks. Poor vision and conditions such as cataracts can make you more likely to have a fall. If you wear glasses, make sure to get your prescription updated if your vision changes. Medicine review. Work with your health care provider to regularly review all of the medicines you are taking, including over-the-counter medicines. Ask your health care provider about any side effects that may make you more likely to have a fall. Tell your health care provider if any medicines that you take make you feel dizzy or sleepy. Strength and balance checks. Your health care provider may recommend certain tests to check your strength and balance while standing, walking, or changing positions. Foot health exam. Foot pain and numbness, as well as not wearing proper footwear, can make you more likely to have a fall. Screenings, including: Osteoporosis screening. Osteoporosis is a condition that causes  the bones to get weaker and break more easily. Blood pressure screening. Blood pressure changes and medicines to control blood pressure can make you feel dizzy. Depression screening. You may be more likely to have a fall if you have a fear of falling, feel depressed, or feel unable to do activities that you used to do. Alcohol use screening. Using too much alcohol can affect your balance and may make you more likely to have a fall. Follow these instructions at home: Lifestyle Do not drink alcohol if: Your health care provider tells you not to drink. If you drink alcohol: Limit how much you have to: 0-1 drink a day for women. 0-2 drinks a day for men. Know how much alcohol is in your drink. In the [place] one drink equals one 12 oz bottle of beer (355 mL), one 5 oz glass of wine (148 mL), or one 1 oz glass of hard liquor (44 mL). Do not use any products that contain nicotine or tobacco. These products include cigarettes, chewing tobacco, and vaping devices, such as e-cigarettes. If you need help quitting, ask your health care provider. Activity  Follow a regular exercise program to stay fit. This will help you maintain your balance. Ask your health care provider what types of exercise are appropriate for you. If you need a cane or walker, use it as recommended by your health care provider. Wear supportive shoes that have nonskid soles. Safety  Remove any tripping hazards, such as rugs, cords, and clutter. Install safety equipment such as grab bars in bathrooms and safety rails on stairs. Keep rooms and walkways   well-lit. General instructions Talk with your health care provider about your risks for falling. Tell your health care provider if: You fall. Be sure to tell your health care provider about all falls, even ones that seem minor. You feel dizzy, tiredness (fatigue), or off-balance. Take over-the-counter and prescription medicines only as told by your health care provider. These include  supplements. Eat a healthy diet and maintain a healthy weight. A healthy diet includes low-fat dairy products, low-fat (lean) meats, and fiber from whole grains, beans, and lots of fruits and vegetables. Stay current with your vaccines. Schedule regular health, dental, and eye exams. Summary Having a healthy lifestyle and getting preventive care can help to protect your health and wellness after age 74. Screening and testing are the best way to find a health problem early and help you avoid having a fall. Early diagnosis and treatment give you the best chance for managing medical conditions that are more common for people who are older than age 74. Falls are a major cause of broken bones and head injuries in people who are older than age 74. Take precautions to prevent a fall at home. Work with your health care provider to learn what changes you can make to improve your health and wellness and to prevent falls. This information is not intended to replace advice given to you by your health care provider. Make sure you discuss any questions you have with your health care provider. Document Revised: 09/19/2020 Document Reviewed: 09/19/2020 Elsevier Patient Education  2023 Else[employer] Inc.  

## 2022-07-27 ENCOUNTER — Telehealth: Payer: Self-pay

## 2022-07-27 ENCOUNTER — Other Ambulatory Visit: Payer: Self-pay

## 2022-07-27 DIAGNOSIS — R7989 Other specified abnormal findings of blood chemistry: Secondary | ICD-10-CM

## 2022-07-27 NOTE — Telephone Encounter (Signed)
Pt returned our call. Shared provider's note.  Pt would like more information regarding creatinine.  Please return pt's call.   Creatinine slightly elevated at 1.35, recommend recheck in 6 weeks with lab only visit   Cholesterol elements within normal range

## 2022-07-27 NOTE — Assessment & Plan Note (Addendum)
Chronic conditions stable  Age appropriate vaccines recommended  Labs ordered including CMP, PSA, Lipid panel  Follow up in 1 year for annual physical exam  Advised patient to participate in regular physical activity for cardiovascular and bone health benefits

## 2022-07-27 NOTE — Assessment & Plan Note (Addendum)
Controlled BP at goal Continue current medications at current doses, telmisartan 80mg  daily  No medications changes today

## 2022-07-27 NOTE — Assessment & Plan Note (Signed)
Chronic  Continue current medication, crestor 20mg  daily  Lipid panel ordered today

## 2022-07-31 LAB — LIPID PANEL
Chol/HDL Ratio: 2.9 ratio (ref 0.0–5.0)
Cholesterol, Total: 148 mg/dL (ref 100–199)
HDL: 51 mg/dL (ref >39)
LDL Chol Calc (NIH): 79 mg/dL (ref 0–99)
Triglycerides: 99 mg/dL (ref 0–149)
VLDL Cholesterol Cal: 18 mg/dL (ref 5–40)

## 2022-07-31 LAB — COMPREHENSIVE METABOLIC PANEL
ALT: 17 IU/L (ref 0–44)
AST: 15 IU/L (ref 0–40)
Albumin/Globulin Ratio: 2.6 — ABNORMAL HIGH (ref 1.2–2.2)
Albumin: 4.7 g/dL (ref 3.8–4.8)
Alkaline Phosphatase: 54 IU/L (ref 44–121)
BUN/Creatinine Ratio: 11 (ref 10–24)
BUN: 15 mg/dL (ref 8–27)
Bilirubin Total: 0.5 mg/dL (ref 0.0–1.2)
CO2: 22 mmol/L (ref 20–29)
Calcium: 9.7 mg/dL (ref 8.6–10.2)
Chloride: 103 mmol/L (ref 96–106)
Creatinine, Ser: 1.35 mg/dL — ABNORMAL HIGH (ref 0.76–1.27)
Globulin, Total: 1.8 g/dL (ref 1.5–4.5)
Glucose: 101 mg/dL — ABNORMAL HIGH (ref 70–99)
Potassium: 5.3 mmol/L — ABNORMAL HIGH (ref 3.5–5.2)
Sodium: 139 mmol/L (ref 134–144)
Total Protein: 6.5 g/dL (ref 6.0–8.5)
eGFR: 55 mL/min/{1.73_m2} — ABNORMAL LOW (ref >59)

## 2022-07-31 LAB — PSA: Prostate Specific Ag, Serum: 2 ng/mL (ref 0.0–4.0)

## 2022-08-17 ENCOUNTER — Ambulatory Visit
Admission: RE | Admit: 2022-08-17 | Discharge: 2022-08-17 | Disposition: A | Discharge status: Home / Self Care | Payer: Medicare HMO | Source: Ambulatory Visit | Attending: Acute Care | Admitting: Acute Care

## 2022-08-17 DIAGNOSIS — Z87891 Personal history of nicotine dependence: Secondary | ICD-10-CM

## 2022-08-17 DIAGNOSIS — Z122 Encounter for screening for malignant neoplasm of respiratory organs: Secondary | ICD-10-CM

## 2022-08-20 ENCOUNTER — Other Ambulatory Visit: Payer: Self-pay | Admitting: Acute Care

## 2022-08-20 DIAGNOSIS — Z122 Encounter for screening for malignant neoplasm of respiratory organs: Secondary | ICD-10-CM

## 2022-08-20 DIAGNOSIS — Z87891 Personal history of nicotine dependence: Secondary | ICD-10-CM

## 2022-09-13 DIAGNOSIS — Z125 Encounter for screening for malignant neoplasm of prostate: Secondary | ICD-10-CM | POA: Insufficient documentation

## 2022-09-13 NOTE — Assessment & Plan Note (Signed)
PSA ordered today

## 2022-09-19 ENCOUNTER — Other Ambulatory Visit: Payer: Self-pay | Admitting: Family Medicine

## 2022-09-19 DIAGNOSIS — E7800 Pure hypercholesterolemia, unspecified: Secondary | ICD-10-CM

## 2022-09-19 NOTE — Telephone Encounter (Signed)
Requested Prescriptions  Pending Prescriptions Disp Refills   rosuvastatin (CRESTOR) 20 MG tablet [Pharmacy Med Name: ROSUVASTATIN CALCIUM 20 MG TAB] 90 tablet 1    Sig: TAKE ONE TABLET BY MOUTH EVERY DAY     Cardiovascular:  Antilipid - Statins 2 Failed - 09/19/2022  9:23 AM      Failed - Cr in normal range and within 360 days    Creatinine  Date Value Ref Range Status  05/28/2014 1.13 0.60 - 1.30 mg/dL Final   Creatinine, Ser  Date Value Ref Range Status  07/26/2022 1.35 (H) 0.76 - 1.27 mg/dL Final         Failed - Lipid Panel in normal range within the last 12 months    Cholesterol, Total  Date Value Ref Range Status  07/26/2022 148 100 - 199 mg/dL Final   LDL Chol Calc (NIH)  Date Value Ref Range Status  07/26/2022 79 0 - 99 mg/dL Final   HDL  Date Value Ref Range Status  07/26/2022 51 >39 mg/dL Final   Triglycerides  Date Value Ref Range Status  07/26/2022 99 0 - 149 mg/dL Final         Passed - Patient is not pregnant      Passed - Valid encounter within last 12 months    Recent Outpatient Visits           1 month ago PE (physical exam), annual   [facility] Simmons-Robinson, [place], MD   5 months ago Need for influenza vaccination   [facility] Lyle, [place], MD   7 months ago Paroxysmal atrial fibrillation [facility])   [place] [facility] Bosie Clos, MD   1 year ago Annual physical exam   [facility] Bosie Clos, MD   1 year ago Essential hypertension   [place] [facility] Bosie Clos, MD       Future Appointments             In 4 months Simmons-Robinson, Tawanna Cooler, MD [facility]

## 2022-12-04 ENCOUNTER — Other Ambulatory Visit: Payer: Self-pay | Admitting: Internal Medicine

## 2022-12-04 DIAGNOSIS — I700 Atherosclerosis of aorta: Secondary | ICD-10-CM

## 2022-12-05 ENCOUNTER — Ambulatory Visit
Admission: RE | Admit: 2022-12-05 | Discharge: 2022-12-05 | Disposition: A | Discharge status: Home / Self Care | Payer: Medicare HMO | Source: Ambulatory Visit | Attending: Internal Medicine | Admitting: Internal Medicine

## 2022-12-05 DIAGNOSIS — I700 Atherosclerosis of aorta: Secondary | ICD-10-CM | POA: Insufficient documentation

## 2023-01-28 ENCOUNTER — Ambulatory Visit: Payer: Medicare HMO | Admitting: Family Medicine

## 2023-03-13 ENCOUNTER — Other Ambulatory Visit: Payer: Self-pay | Admitting: Student

## 2023-03-13 DIAGNOSIS — G20C Parkinsonism, unspecified: Secondary | ICD-10-CM

## 2023-03-14 ENCOUNTER — Encounter: Payer: Self-pay | Admitting: Student

## 2023-03-21 ENCOUNTER — Ambulatory Visit: Payer: Medicare HMO

## 2023-03-27 ENCOUNTER — Ambulatory Visit
Admission: RE | Admit: 2023-03-27 | Discharge: 2023-03-27 | Disposition: A | Discharge status: Home / Self Care | Payer: Medicare HMO | Source: Ambulatory Visit | Attending: Student | Admitting: Student

## 2023-03-27 DIAGNOSIS — G20C Parkinsonism, unspecified: Secondary | ICD-10-CM

## 2023-07-09 ENCOUNTER — Other Ambulatory Visit: Payer: Self-pay

## 2023-07-09 DIAGNOSIS — Z122 Encounter for screening for malignant neoplasm of respiratory organs: Secondary | ICD-10-CM

## 2023-07-09 DIAGNOSIS — Z87891 Personal history of nicotine dependence: Secondary | ICD-10-CM

## 2023-08-21 ENCOUNTER — Ambulatory Visit
Admission: RE | Admit: 2023-08-21 | Discharge: 2023-08-21 | Disposition: A | Discharge status: Home / Self Care | Payer: Medicare HMO | Source: Ambulatory Visit | Attending: Internal Medicine | Admitting: Internal Medicine

## 2023-08-21 DIAGNOSIS — Z87891 Personal history of nicotine dependence: Secondary | ICD-10-CM

## 2023-08-21 DIAGNOSIS — Z122 Encounter for screening for malignant neoplasm of respiratory organs: Secondary | ICD-10-CM

## 2023-08-23 ENCOUNTER — Other Ambulatory Visit: Payer: Self-pay

## 2023-08-23 DIAGNOSIS — Z122 Encounter for screening for malignant neoplasm of respiratory organs: Secondary | ICD-10-CM

## 2023-08-23 DIAGNOSIS — Z87891 Personal history of nicotine dependence: Secondary | ICD-10-CM

## 2023-09-02 ENCOUNTER — Other Ambulatory Visit: Payer: Self-pay | Admitting: Family Medicine

## 2023-09-02 DIAGNOSIS — E7800 Pure hypercholesterolemia, unspecified: Secondary | ICD-10-CM

## 2023-09-10 ENCOUNTER — Other Ambulatory Visit: Payer: Self-pay | Admitting: Family Medicine

## 2023-09-10 DIAGNOSIS — E7800 Pure hypercholesterolemia, unspecified: Secondary | ICD-10-CM

## 2023-09-13 ENCOUNTER — Encounter: Admission: RE | Payer: Self-pay | Source: Home / Self Care

## 2023-09-13 ENCOUNTER — Ambulatory Visit: Admission: RE | Admit: 2023-09-13 | Source: Home / Self Care | Admitting: Internal Medicine

## 2023-09-13 DIAGNOSIS — I480 Paroxysmal atrial fibrillation: Secondary | ICD-10-CM

## 2023-09-13 SURGERY — CARDIOVERSION
Anesthesia: General

## 2023-09-13 NOTE — Telephone Encounter (Signed)
 Unable to refill per protocol, provider not at this practice. PCP is Dr. Oletta Berry.  Requested Prescriptions  Pending Prescriptions Disp Refills   rosuvastatin  (CRESTOR ) 20 MG tablet [Pharmacy Med Name: ROSUVASTATIN  CALCIUM  20 MG TAB] 90 tablet 1    Sig: TAKE ONE TABLET BY MOUTH EVERY DAY     There is no refill protocol information for this order

## 2023-09-24 ENCOUNTER — Other Ambulatory Visit: Payer: Self-pay | Admitting: Neurology

## 2023-09-24 DIAGNOSIS — G608 Other hereditary and idiopathic neuropathies: Secondary | ICD-10-CM

## 2023-09-25 ENCOUNTER — Encounter: Payer: Self-pay | Admitting: Neurology

## 2023-09-28 ENCOUNTER — Ambulatory Visit
Admission: RE | Admit: 2023-09-28 | Discharge: 2023-09-28 | Disposition: A | Discharge status: Home / Self Care | Source: Ambulatory Visit | Attending: Neurology | Admitting: Neurology

## 2023-09-28 DIAGNOSIS — G608 Other hereditary and idiopathic neuropathies: Secondary | ICD-10-CM

## 2023-12-19 LAB — COLOGUARD: COLOGUARD: NEGATIVE

## 2024-02-07 ENCOUNTER — Other Ambulatory Visit: Payer: Self-pay | Admitting: Neurology

## 2024-02-07 DIAGNOSIS — R2689 Other abnormalities of gait and mobility: Secondary | ICD-10-CM

## 2024-02-13 ENCOUNTER — Ambulatory Visit
Admission: RE | Admit: 2024-02-13 | Discharge: 2024-02-13 | Disposition: A | Discharge status: Home / Self Care | Source: Ambulatory Visit | Attending: Neurology | Admitting: Neurology

## 2024-02-13 DIAGNOSIS — R2689 Other abnormalities of gait and mobility: Secondary | ICD-10-CM | POA: Insufficient documentation

## 2024-04-13 NOTE — Therapy (Signed)
 OUTPATIENT PHYSICAL THERAPY NEURO EVALUATION   Patient Name: Pedro Swanson MRN: [id number] DOB:08-23-1950, 73 y.o., male Today's Date: 04/15/2024   PCP: Dr. Charlie Forte REFERRING PROVIDER: Dr. Cara Lovelace  END OF SESSION:  PT End of Session - 04/15/24 2224     Visit Number 1    Number of Visits 24    Date for Recertification  07/07/24    PT Start Time 0933    PT Stop Time 1014    PT Time Calculation (min) 41 min    Equipment Utilized During Treatment Gait belt    Activity Tolerance Patient tolerated treatment well    Behavior During Therapy [facility] for tasks assessed/performed           Past Medical History:  Diagnosis Date   Cataract    COVID    Hyperlipidemia    Hypertension    Palpitations    Shingles    Past Surgical History:  Procedure Laterality Date   CATARACT EXTRACTION Bilateral    Patient Active Problem List   Diagnosis Date Noted   Special screening, prostate cancer 09/13/2022   PE (physical exam), annual 07/26/2022   Bradycardia 01/24/2022   OSA (obstructive sleep apnea) 12/14/2020   Paroxysmal atrial fibrillation (HCC) 07/11/2020   Pain in joint of left shoulder 01/11/2020   Atherosclerosis of abdominal aorta 05/04/2019   Arteriosclerotic cardiovascular disease (ASCVD) 07/08/2018   Coronary artery disease involving native coronary artery of native heart 10/02/2017   Benign fibroma of prostate 01/18/2015   Hypercholesteremia 01/18/2015   BP (high blood pressure) 01/18/2015   Osteoarthrosis 01/18/2015   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 01/18/2015   Avitaminosis D 01/18/2015    ONSET DATE: 2-3 years- progressive  REFERRING DIAG:  R29.898 (ICD-10-CM) - Other symptoms and signs involving the musculoskeletal system  G25.9 (ICD-10-CM) - Extrapyramidal and movement disorder, unspecified    THERAPY DIAG:  Muscle weakness (generalized) - Plan: PT plan of care cert/re-cert  Other lack of coordination - Plan: PT plan of care  cert/re-cert  Unsteadiness on feet - Plan: PT plan of care cert/re-cert  Abnormal posture - Plan: PT plan of care cert/re-cert  Difficulty in walking, not elsewhere classified - Plan: PT plan of care cert/re-cert  Rationale for Evaluation and Treatment: Rehabilitation  SUBJECTIVE:                                                                                                                                                                                             SUBJECTIVE STATEMENT: I have been dealing with some unsteadiness, some right hip/groin pain. Been told over the years - I have polyneuropathy or  even PSP. Wife reports increased overall shuffling.   Pt accompanied by: significant other- [name]  PERTINENT HISTORY: Referral came from Cardiologist who saw patient on 03/16/2024- weakness both legs with diagnosis of Extrapyramidal disease and movement disorder. Paitent has PMH significant for HTN, CAD, Paroxysmal atrial fibrillation, Mixed Hyperlipidemia, Bradycardia, Obstructive sleep apnea, Chronic renal insufficiency, Shortness of breath on exertion, Smoking history  PAIN:  Are you having pain? Yes: NPRS scale: 2-3/10 current; worst- 8-9/10 Pain location: R hip/groin pain- Pain description: sharp with movement Aggravating factors: active movement- getting up/down - in/out of car.  Relieving factors: rest  PRECAUTIONS: None  RED FLAGS: None   WEIGHT BEARING RESTRICTIONS: No  FALLS: Has patient fallen in last 6 months? No  LIVING ENVIRONMENT: Lives with: lives with their spouse Lives in: House/apartment Stairs: 1 step into home Has following equipment at home: Single point cane  PLOF: Independent- goes to [employer] 3x/week- stationary bike- 2-3 mi, leg press, squats, push up at wall.   PATIENT GOALS: Show me some activities to do at gym.   OBJECTIVE:  Note: Objective measures were completed at Evaluation unless otherwise noted.  DIAGNOSTIC FINDINGS: MRI- Thoracic  Spine- 02/13/2024 IMPRESSION: 1. No significant spinal canal or neural foraminal stenosis. 2. Small central disc protrusions at T4-5 and T6-7 indenting the ventral thecal sac without cord or nerve root impingement.  COGNITION: Overall cognitive status: Within functional limits for tasks assessed   SENSATION: Light touch: WFL- B LE  COORDINATION: Impaired heel to shin Bilaterally- delayed reaction  EDEMA:  None observed  MUSCLE TONE: WFL    POSTURE: rounded shoulders and forward head  LOWER EXTREMITY ROM:     Active  Right Eval Left Eval  Hip flexion    Hip extension    Hip abduction    Hip adduction    Hip internal rotation    Hip external rotation    Knee flexion    Knee extension    Ankle dorsiflexion    Ankle plantarflexion    Ankle inversion    Ankle eversion     (Blank rows = not tested)  LOWER EXTREMITY MMT:    MMT Right Eval Left Eval  Hip flexion 4 4  Hip extension    Hip abduction 4 4  Hip adduction    Hip internal rotation    Hip external rotation    Knee flexion 4 4  Knee extension 4 4  Ankle dorsiflexion 4 4  Ankle plantarflexion    Ankle inversion    Ankle eversion    (Blank rows = not tested)  BED MOBILITY:  Not tested  TRANSFERS: Sit to stand: CGA  Assistive device utilized: None     Stand to sit: CGA  Assistive device utilized: None     Chair to chair: CGA  Assistive device utilized: None       RAMP:  Not tested  CURB:  Not tested  STAIRS: Not tested GAIT: Findings: Gait Characteristics: WFL, step to pattern, decreased arm swing- Right, decreased arm swing- Left, decreased step length- Right, decreased step length- Left, decreased stance time- Right, decreased stance time- Left, decreased stride length, decreased hip/knee flexion- Right, decreased hip/knee flexion- Left, decreased ankle dorsiflexion- Right, decreased ankle dorsiflexion- Left, and shuffling, Distance walked: approx 200 feet, Assistive device utilized:None,  and Level of assistance: CGA  FUNCTIONAL TESTS:  5 times sit to stand: 18.45 sec Timed up and go (TUG): 22.86 w/o AD 6 minute walk test: To be assessed visit #2 10 meter  walk test: 22.86 and 23.86 Berg Balance Scale:  Item Test date: 04/14/2024 Date:  Date:   Sitting to standing 4. able to stand without using hands and stabilize independently Insert SmartPhrase OPRCBERGREEVAL Insert SmartPhrase OPRCBERGREEVAL  2. Standing unsupported 4. able to stand safely for 2 minutes    3. Sitting with back unsupported, feet  supported 4. able to sit safely and securely for 2 minutes    4. Standing to sitting 4. sits safely with minimal use of hands    5. Pivot transfer  4. able to transfer safely with minor use of hands    6. Standing unsupported with eyes closed 3. able to stand 10 seconds with supervision    7. Standing unsupported with feet together 4. able to place feet together independently and stand 1 minute safely    8. Reaching forward with outstretched arms while standing 3. can reach forward 12 cm (5 inches)    9. Pick up object from the floor from standing 3. able to pick up slipper but needs supervision    10. Turning to look behind over left and right shoulders while standing 2. turns sideways but only maintains balance    11. Turn 360 degrees 2. able to turn 360 degrees safely but slowly    12. Place alternate foot on step or stool while standing unsupported 3. able to stand independently and complete 8 steps in > 20 seconds     13. Standing unsupported one foot in front 3. able to place foot ahead independently and hold 30 seconds    14. Standing on one leg 1. tries to lift leg unable to hold 3 seconds but remains standing independently.      Total Score 44/56 Total Score:    Total Score:      PATIENT SURVEYS:  ABC to be issued visit #2                                                                                                                               TREATMENT DATE:  04/14/2024  PT Evaluation   Self-Care/Home Management:   Pt given education on current POC, the findings of the evaluation, and the ways in which skilled therapy can address the current deficits in balance and musculature strength.  Pt instructed on how this can improve their overall function and maintain/improve their overall quality of life.        PATIENT EDUCATION: Education details: Proposed plan of care; Discussion of medical terminology including diagnosis of Extrapyramidal and movement disorder; explanation of functional outcome testing. Person educated: Patient and Spouse Education method: Explanation Education comprehension: verbalized understanding  HOME EXERCISE PROGRAM: To be initiated next visit.  GOALS: Goals reviewed with patient? Yes  SHORT TERM GOALS: Target date: 05/27/2024  Pt will be independent with HEP in order to improve strength and balance in order to decrease fall risk and improve function at home.  Baseline: EVAL- No formal HEP  in place Goal status: INITIAL   LONG TERM GOALS: Target date: 07/07/2024  1.  Patient will complete five times sit to stand test in < 15 seconds indicating an increased LE strength and improved balance. Baseline: 04/14/2024= 18.45 sec Goal status: INITIAL  2.  Patient will improve ABC score by 9 points to demonstrate statistically significant improvement in mobility and quality of life as it relates to their Confidence in his balance.  Baseline: EVAL- To be assessed visit #2 Goal status: INITIAL   3.  Patient will increase Berg Balance score by > 6 points to demonstrate decreased fall risk during functional activities. Baseline: EVAL= 44/56 Goal status: INITIAL   4.   Patient will reduce timed up and go to <11 seconds to reduce fall risk and demonstrate improved transfer/gait ability. Baseline: EVAL= 22.86 sec without AD Goal status: INITIAL  5.   Patient will increase 10 meter walk test to >1.13m/s as to improve gait  speed for better community ambulation and to reduce fall risk. Baseline: EVAL = 0.43 m/s avg without UE support Goal status: INITIAL  6.   Patient will increase six minute walk test distance > 150 feet for progression to community ambulator and improve gait ability Baseline: EVAL= To be assessed visit #2 Goal status: INITIAL   ASSESSMENT:  CLINICAL IMPRESSION: Patient is a 73 y.o. male who was seen today for physical therapy evaluation and treatment for Extrapyramidal and movement disorder. PT examination reveals deficits . Pt presents with deficits in strength, gait and balance and will benefit from skilled PT services to address these deficits, improve balance, and decrease risk for future falls.    OBJECTIVE IMPAIRMENTS: Abnormal gait, cardiopulmonary status limiting activity, decreased activity tolerance, decreased balance, decreased coordination, decreased endurance, decreased knowledge of condition, decreased mobility, difficulty walking, decreased ROM, decreased strength, hypomobility, and pain.   ACTIVITY LIMITATIONS: carrying, lifting, bending, sitting, standing, squatting, stairs, and transfers  PARTICIPATION LIMITATIONS: meal prep, cleaning, laundry, driving, shopping, community activity, and yard work  PERSONAL FACTORS: 1-2 comorbidities: HTN, OA are also affecting patient's functional outcome.   REHAB POTENTIAL: Good  CLINICAL DECISION MAKING: Evolving/moderate complexity  EVALUATION COMPLEXITY: Moderate  PLAN:  PT FREQUENCY: 1-2x/week  PT DURATION: 12 weeks  PLANNED INTERVENTIONS: 97164- PT Re-evaluation, 97750- Physical Performance Testing, 97110-Therapeutic exercises, 97530- Therapeutic activity, [id number]- Neuromuscular re-education, 97535- Self Care, [id number]- Manual therapy, [id number]- Gait training, [phone]- Orthotic Initial, [phone]- Orthotic/Prosthetic subsequent, [phone]- Canalith repositioning, [id number]- Electrical stimulation (manual), [phone] (1-2 muscles), [id number] (3+ muscles)-  Dry Needling, Patient/Family education, Balance training, Stair training, Taping, Joint mobilization, Spinal mobilization, Vestibular training, DME instructions, Cryotherapy, and Moist heat  PLAN FOR NEXT SESSION:   - 6 min walk -ABC Scale -Update HEP  -Balance training - static and dynamic   Reyes LOISE London, PT 04/15/2024, 10:26 PM

## 2024-04-14 ENCOUNTER — Ambulatory Visit: Attending: Internal Medicine

## 2024-04-14 DIAGNOSIS — R293 Abnormal posture: Secondary | ICD-10-CM | POA: Insufficient documentation

## 2024-04-14 DIAGNOSIS — R278 Other lack of coordination: Secondary | ICD-10-CM | POA: Diagnosis present

## 2024-04-14 DIAGNOSIS — R2681 Unsteadiness on feet: Secondary | ICD-10-CM | POA: Insufficient documentation

## 2024-04-14 DIAGNOSIS — R262 Difficulty in walking, not elsewhere classified: Secondary | ICD-10-CM | POA: Diagnosis present

## 2024-04-14 DIAGNOSIS — M6281 Muscle weakness (generalized): Secondary | ICD-10-CM | POA: Insufficient documentation

## 2024-04-16 ENCOUNTER — Ambulatory Visit

## 2024-04-16 DIAGNOSIS — R293 Abnormal posture: Secondary | ICD-10-CM

## 2024-04-16 DIAGNOSIS — R278 Other lack of coordination: Secondary | ICD-10-CM

## 2024-04-16 DIAGNOSIS — R262 Difficulty in walking, not elsewhere classified: Secondary | ICD-10-CM

## 2024-04-16 DIAGNOSIS — R2681 Unsteadiness on feet: Secondary | ICD-10-CM

## 2024-04-16 DIAGNOSIS — M6281 Muscle weakness (generalized): Secondary | ICD-10-CM

## 2024-04-16 NOTE — Therapy (Signed)
 OUTPATIENT PHYSICAL THERAPY NEURO TREATMENT   Patient Name: Pedro Swanson MRN: [id number] DOB:[birth year], 73 y.o., male Today's Date: 04/17/2024   PCP: Dr. Charlie Forte REFERRING PROVIDER: Dr. Cara Lovelace  END OF SESSION:  PT End of Session - 04/16/24 0916     Visit Number 2    Number of Visits 24    Date for Recertification  07/07/24    Progress Note Due on Visit 10    PT Start Time 0924    PT Stop Time 1012    PT Time Calculation (min) 48 min    Equipment Utilized During Treatment Gait belt    Activity Tolerance Patient tolerated treatment well    Behavior During Therapy [facility] for tasks assessed/performed           Past Medical History:  Diagnosis Date   Cataract    COVID    Hyperlipidemia    Hypertension    Palpitations    Shingles    Past Surgical History:  Procedure Laterality Date   CATARACT EXTRACTION Bilateral    Patient Active Problem List   Diagnosis Date Noted   Special screening, prostate cancer 09/13/2022   PE (physical exam), annual 07/26/2022   Bradycardia 01/24/2022   OSA (obstructive sleep apnea) 12/14/2020   Paroxysmal atrial fibrillation (HCC) 07/11/2020   Pain in joint of left shoulder 01/11/2020   Atherosclerosis of abdominal aorta 05/04/2019   Arteriosclerotic cardiovascular disease (ASCVD) 07/08/2018   Coronary artery disease involving native coronary artery of native heart 10/02/2017   Benign fibroma of prostate 01/18/2015   Hypercholesteremia 01/18/2015   BP (high blood pressure) 01/18/2015   Osteoarthrosis 01/18/2015   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 01/18/2015   Avitaminosis D 01/18/2015    ONSET DATE: 2-3 years- progressive  REFERRING DIAG:  R29.898 (ICD-10-CM) - Other symptoms and signs involving the musculoskeletal system  G25.9 (ICD-10-CM) - Extrapyramidal and movement disorder, unspecified    THERAPY DIAG:  Muscle weakness (generalized)  Other lack of coordination  Unsteadiness on feet  Abnormal  posture  Difficulty in walking, not elsewhere classified  Rationale for Evaluation and Treatment: Rehabilitation  SUBJECTIVE:                                                                                                                                                                                             SUBJECTIVE STATEMENT: From Today: Patient reports doing okay- no falls- still shuffling of course.     From EVAL: I have been dealing with some unsteadiness, some right hip/groin pain. Been told over the years - I have polyneuropathy or even PSP. Wife reports increased overall shuffling.  Pt accompanied by: significant other- [name]  PERTINENT HISTORY: Referral came from Cardiologist who saw patient on 03/16/2024- weakness both legs with diagnosis of Extrapyramidal disease and movement disorder. Paitent has PMH significant for HTN, CAD, Paroxysmal atrial fibrillation, Mixed Hyperlipidemia, Bradycardia, Obstructive sleep apnea, Chronic renal insufficiency, Shortness of breath on exertion, Smoking history  PAIN:  Are you having pain? Yes: NPRS scale: 2-3/10 current; worst- 8-9/10 Pain location: R hip/groin pain- Pain description: sharp with movement Aggravating factors: active movement- getting up/down - in/out of car.  Relieving factors: rest  PRECAUTIONS: None  RED FLAGS: None   WEIGHT BEARING RESTRICTIONS: No  FALLS: Has patient fallen in last 6 months? No  LIVING ENVIRONMENT: Lives with: lives with their spouse Lives in: House/apartment Stairs: 1 step into home Has following equipment at home: Single point cane  PLOF: Independent- goes to [employer] 3x/week- stationary bike- 2-3 mi, leg press, squats, push up at wall.   PATIENT GOALS: Show me some activities to do at gym.   OBJECTIVE:  Note: Objective measures were completed at Evaluation unless otherwise noted.  DIAGNOSTIC FINDINGS: MRI- Thoracic Spine- 02/13/2024 IMPRESSION: 1. No significant spinal canal or  neural foraminal stenosis. 2. Small central disc protrusions at T4-5 and T6-7 indenting the ventral thecal sac without cord or nerve root impingement.  COGNITION: Overall cognitive status: Within functional limits for tasks assessed   SENSATION: Light touch: WFL- B LE  COORDINATION: Impaired heel to shin Bilaterally- delayed reaction  EDEMA:  None observed  MUSCLE TONE: WFL    POSTURE: rounded shoulders and forward head  LOWER EXTREMITY ROM:     Active  Right Eval Left Eval  Hip flexion    Hip extension    Hip abduction    Hip adduction    Hip internal rotation    Hip external rotation    Knee flexion    Knee extension    Ankle dorsiflexion    Ankle plantarflexion    Ankle inversion    Ankle eversion     (Blank rows = not tested)  LOWER EXTREMITY MMT:    MMT Right Eval Left Eval  Hip flexion 4 4  Hip extension    Hip abduction 4 4  Hip adduction    Hip internal rotation    Hip external rotation    Knee flexion 4 4  Knee extension 4 4  Ankle dorsiflexion 4 4  Ankle plantarflexion    Ankle inversion    Ankle eversion    (Blank rows = not tested)  BED MOBILITY:  Not tested  TRANSFERS: Sit to stand: CGA  Assistive device utilized: None     Stand to sit: CGA  Assistive device utilized: None     Chair to chair: CGA  Assistive device utilized: None       RAMP:  Not tested  CURB:  Not tested  STAIRS: Not tested GAIT: Findings: Gait Characteristics: WFL, step to pattern, decreased arm swing- Right, decreased arm swing- Left, decreased step length- Right, decreased step length- Left, decreased stance time- Right, decreased stance time- Left, decreased stride length, decreased hip/knee flexion- Right, decreased hip/knee flexion- Left, decreased ankle dorsiflexion- Right, decreased ankle dorsiflexion- Left, and shuffling, Distance walked: approx 200 feet, Assistive device utilized:None, and Level of assistance: CGA  FUNCTIONAL TESTS:  5 times sit to  stand: 18.45 sec Timed up and go (TUG): 22.86 w/o AD 6 minute walk test: To be assessed visit #2 10 meter walk test: 22.86 and 23.86 Berg Balance Scale:  Item Test date: 04/14/2024 Date:  Date:   Sitting to standing 4. able to stand without using hands and stabilize independently Insert SmartPhrase OPRCBERGREEVAL Insert SmartPhrase OPRCBERGREEVAL  2. Standing unsupported 4. able to stand safely for 2 minutes    3. Sitting with back unsupported, feet  supported 4. able to sit safely and securely for 2 minutes    4. Standing to sitting 4. sits safely with minimal use of hands    5. Pivot transfer  4. able to transfer safely with minor use of hands    6. Standing unsupported with eyes closed 3. able to stand 10 seconds with supervision    7. Standing unsupported with feet together 4. able to place feet together independently and stand 1 minute safely    8. Reaching forward with outstretched arms while standing 3. can reach forward 12 cm (5 inches)    9. Pick up object from the floor from standing 3. able to pick up slipper but needs supervision    10. Turning to look behind over left and right shoulders while standing 2. turns sideways but only maintains balance    11. Turn 360 degrees 2. able to turn 360 degrees safely but slowly    12. Place alternate foot on step or stool while standing unsupported 3. able to stand independently and complete 8 steps in > 20 seconds     13. Standing unsupported one foot in front 3. able to place foot ahead independently and hold 30 seconds    14. Standing on one leg 1. tries to lift leg unable to hold 3 seconds but remains standing independently.      Total Score 44/56 Total Score:    Total Score:      PATIENT SURVEYS:  The Activities-Specific Balance Confidence (ABC) Scale 0% 10 20 30  40 50 60 70 80 90 100% No confidence<->completely confident  "How confident are you that you will not lose your balance or become unsteady when you . . .   Date tested  04/16/2025  Walk around the house 60%  2. Walk up or down stairs 100% w rails  3. Bend over and pick up a slipper from in front of a closet floor 80%  4. Reach for a small can off a shelf at eye level 100%  5. Stand on tip toes and reach for something above your head 80%  6. Stand on a chair and reach for something 10%  7. Sweep the floor 50%  8. Walk outside the house to a car parked in the driveway 90%  9. Get into or out of a car 100%  10. Walk across a parking lot to the mall 70%  11. Walk up or down a ramp 80% with rails  12. Walk in a crowded mall where people rapidly walk past you 40%  13. Are bumped into by people as you walk through the mall 0% avoid  14. Step onto or off of an escalator while you are holding onto the railing 0% n/a  15. Step onto or off an escalator while holding onto parcels such that you cannot hold onto the railing 100%  16. Walk outside on icy sidewalks 100%  Total: #/16 66.25  TREATMENT DATE: 04/16/2024  *completion of ABC scale (see above)   6 Min Walk Test:  Instructed patient to ambulate as quickly and as safely as possible for 6 minutes using LRAD. Patient was allowed to take standing rest breaks without stopping the test, but if the patient required a sitting rest break the clock would be stopped and the test would be over.  Results: 254 feet  ( meters, Avg speed m/s) using no AD with CGA. Results indicate that the patient has reduced endurance with ambulation compared to age matched norms.  Age Matched Norms: 67-69 yo M: 42 F: 46, 31-79 yo M: 62 F: 471, 33-89 yo M: 417 F: 392 MDC: 58.21 meters (190.98 feet) or 50 meters (ANPTA Core Set of Outcome Measures for Adults with Neurologic Conditions, 2018)   Therex:  Hip march standing (for increased height) x 15 reps alt LE Hip abd (stand) x 15 reps ea LE Hip circles  (stand) x 15 reps ea LE Hip ext x 15 alt LE    Self-Care/Home Management: Added above activities to HEP- Issued handout       PATIENT EDUCATION: Education details: Proposed plan of care; Discussion of medical terminology including diagnosis of Extrapyramidal and movement disorder; explanation of functional outcome testing. Person educated: Patient and Spouse Education method: Explanation Education comprehension: verbalized understanding  HOME EXERCISE PROGRAM: Access Code: [email or web address] URL: [email or web address] Date: 04/17/2024 Prepared by: Reyes London  Exercises - Standing Hip Flexion AROM  - 3 x weekly - 3 sets - 10 reps - Standing Hip Abduction with Counter Support  - 3 x weekly - 3 sets - 10 reps - Standing Hip Circles with Ankle Floats  - 1 x daily - 3 x weekly - 3 sets - 10 reps - Standing Hip Extension with Counter Support  - 3 x weekly - 3 sets - 10 reps  GOALS: Goals reviewed with patient? Yes  SHORT TERM GOALS: Target date: 05/27/2024  Pt will be independent with HEP in order to improve strength and balance in order to decrease fall risk and improve function at home.  Baseline: EVAL- No formal HEP in place Goal status: INITIAL   LONG TERM GOALS: Target date: 07/07/2024  1.  Patient will complete five times sit to stand test in < 15 seconds indicating an increased LE strength and improved balance. Baseline: 04/14/2024= 18.45 sec Goal status: INITIAL  2.  Patient will improve ABC score by 9 points to demonstrate statistically significant improvement in mobility and quality of life as it relates to their Confidence in his balance.  Baseline: EVAL- To be assessed visit #2 Goal status: INITIAL   3.  Patient will increase Berg Balance score by > 6 points to demonstrate decreased fall risk during functional activities. Baseline: EVAL= 44/56 Goal status: INITIAL   4.   Patient will reduce timed up and go to <11 seconds to reduce fall risk and  demonstrate improved transfer/gait ability. Baseline: EVAL= 22.86 sec without AD Goal status: INITIAL  5.   Patient will increase 10 meter walk test to >1.57m/s as to improve gait speed for better community ambulation and to reduce fall risk. Baseline: EVAL = 0.43 m/s avg without UE support Goal status: INITIAL  6.   Patient will increase six minute walk test distance > 150 feet for progression to community ambulator and improve gait ability Baseline: EVAL= To be assessed visit #2; 04/16/2024= 254 feet with SPC Goal status: INITIAL   ASSESSMENT:  CLINICAL IMPRESSION: Patient is a  73 y.o. male who was seen today for physical therapy treatment for Extrapyramidal and movement disorder. Further assessed physical ability including 6 min walk today- He presents with slow gait speed and increased shuffling limited his functional endurance. He will benefit from skilled PT interventions to assist with improving his gait efficiency and quality. Initiated some hip strengthening to assist with advancing/lifting LE.  Pt presents with deficits in strength, gait and balance and will benefit from skilled PT services to address these deficits, improve balance, and decrease risk for future falls.    OBJECTIVE IMPAIRMENTS: Abnormal gait, cardiopulmonary status limiting activity, decreased activity tolerance, decreased balance, decreased coordination, decreased endurance, decreased knowledge of condition, decreased mobility, difficulty walking, decreased ROM, decreased strength, hypomobility, and pain.   ACTIVITY LIMITATIONS: carrying, lifting, bending, sitting, standing, squatting, stairs, and transfers  PARTICIPATION LIMITATIONS: meal prep, cleaning, laundry, driving, shopping, community activity, and yard work  PERSONAL FACTORS: 1-2 comorbidities: HTN, OA are also affecting patient's functional outcome.   REHAB POTENTIAL: Good  CLINICAL DECISION MAKING: Evolving/moderate complexity  EVALUATION  COMPLEXITY: Moderate  PLAN:  PT FREQUENCY: 1-2x/week  PT DURATION: 12 weeks  PLANNED INTERVENTIONS: [id number]- PT Re-evaluation, 97750- Physical Performance Testing, 97110-Therapeutic exercises, 97530- Therapeutic activity, [id number]- Neuromuscular re-education, 97535- Self Care, [id number]- Manual therapy, [id number]- Gait training, [phone]- Orthotic Initial, [phone]- Orthotic/Prosthetic subsequent, [phone]- Canalith repositioning, [id number]- Electrical stimulation (manual), [phone] (1-2 muscles), [id number] (3+ muscles)- Dry Needling, Patient/Family education, Balance training, Stair training, Taping, Joint mobilization, Spinal mobilization, Vestibular training, DME instructions, Cryotherapy, and Moist heat  PLAN FOR NEXT SESSION:   -Update HEP  -Balance training - static and dynamic -LE strengthening interventions   Reyes LOISE London, PT 04/17/2024, 9:20 AM

## 2024-04-21 ENCOUNTER — Ambulatory Visit

## 2024-04-21 DIAGNOSIS — M6281 Muscle weakness (generalized): Secondary | ICD-10-CM | POA: Diagnosis not present

## 2024-04-21 DIAGNOSIS — R278 Other lack of coordination: Secondary | ICD-10-CM

## 2024-04-21 DIAGNOSIS — R262 Difficulty in walking, not elsewhere classified: Secondary | ICD-10-CM

## 2024-04-21 DIAGNOSIS — R2681 Unsteadiness on feet: Secondary | ICD-10-CM

## 2024-04-21 DIAGNOSIS — R293 Abnormal posture: Secondary | ICD-10-CM

## 2024-04-21 NOTE — Therapy (Signed)
 OUTPATIENT PHYSICAL THERAPY NEURO TREATMENT   Patient Name: Pedro Swanson MRN: [id number] DOB:03-04-1951, 73 y.o., male Today's Date: 04/21/2024   PCP: Dr. Charlie Forte REFERRING PROVIDER: Dr. Cara Lovelace  END OF SESSION:  PT End of Session - 04/21/24 0941     Visit Number 3    Number of Visits 24    Date for Recertification  07/07/24    Progress Note Due on Visit 10    PT Start Time 0932    Equipment Utilized During Treatment Gait belt    Activity Tolerance Patient tolerated treatment well    Behavior During Therapy [facility] for tasks assessed/performed            Past Medical History:  Diagnosis Date   Cataract    COVID    Hyperlipidemia    Hypertension    Palpitations    Shingles    Past Surgical History:  Procedure Laterality Date   CATARACT EXTRACTION Bilateral    Patient Active Problem List   Diagnosis Date Noted   Special screening, prostate cancer 09/13/2022   PE (physical exam), annual 07/26/2022   Bradycardia 01/24/2022   OSA (obstructive sleep apnea) 12/14/2020   Paroxysmal atrial fibrillation (HCC) 07/11/2020   Pain in joint of left shoulder 01/11/2020   Atherosclerosis of abdominal aorta 05/04/2019   Arteriosclerotic cardiovascular disease (ASCVD) 07/08/2018   Coronary artery disease involving native coronary artery of native heart 10/02/2017   Benign fibroma of prostate 01/18/2015   Hypercholesteremia 01/18/2015   BP (high blood pressure) 01/18/2015   Osteoarthrosis 01/18/2015   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 01/18/2015   Avitaminosis D 01/18/2015    ONSET DATE: 2-3 years- progressive  REFERRING DIAG:  R29.898 (ICD-10-CM) - Other symptoms and signs involving the musculoskeletal system  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.9 (ICD-10-CM) - Extrapyramidal and movement disorder, unspecified    THERAPY DIAG:  Muscle weakness (generalized)  Other lack of coordination  Unsteadiness on feet  Abnormal posture  Difficulty in walking, not elsewhere  classified  Rationale for Evaluation and Treatment: Rehabilitation  SUBJECTIVE:                                                                                                                                                                                             SUBJECTIVE STATEMENT: From Today: Patient reports going to have hip injection on Wednesday- states doing okay today.      From EVAL: I have been dealing with some unsteadiness, some right hip/groin pain. Been told over the years - I have polyneuropathy or even PSP. Wife reports increased overall shuffling.   Pt accompanied by: significant other- [name]  PERTINENT HISTORY: Referral  came from Cardiologist who saw patient on 03/16/2024- weakness both legs with diagnosis of Extrapyramidal disease and movement disorder. Paitent has PMH significant for HTN, CAD, Paroxysmal atrial fibrillation, Mixed Hyperlipidemia, Bradycardia, Obstructive sleep apnea, Chronic renal insufficiency, Shortness of breath on exertion, Smoking history  PAIN:  Are you having pain? Yes: NPRS scale: 2-3/10 current; worst- 8-9/10 Pain location: R hip/groin pain- Pain description: sharp with movement Aggravating factors: active movement- getting up/down - in/out of car.  Relieving factors: rest  PRECAUTIONS: None  RED FLAGS: None   WEIGHT BEARING RESTRICTIONS: No  FALLS: Has patient fallen in last 6 months? No  LIVING ENVIRONMENT: Lives with: lives with their spouse Lives in: House/apartment Stairs: 1 step into home Has following equipment at home: Single point cane  PLOF: Independent- goes to [employer] 3x/week- stationary bike- 2-3 mi, leg press, squats, push up at wall.   PATIENT GOALS: Show me some activities to do at gym.   OBJECTIVE:  Note: Objective measures were completed at Evaluation unless otherwise noted.  DIAGNOSTIC FINDINGS: MRI- Thoracic Spine- 02/13/2024 IMPRESSION: 1. No significant spinal canal or neural foraminal stenosis. 2.  Small central disc protrusions at T4-5 and T6-7 indenting the ventral thecal sac without cord or nerve root impingement.  COGNITION: Overall cognitive status: Within functional limits for tasks assessed   SENSATION: Light touch: WFL- B LE  COORDINATION: Impaired heel to shin Bilaterally- delayed reaction  EDEMA:  None observed  MUSCLE TONE: WFL    POSTURE: rounded shoulders and forward head  LOWER EXTREMITY ROM:     Active  Right Eval Left Eval  Hip flexion    Hip extension    Hip abduction    Hip adduction    Hip internal rotation    Hip external rotation    Knee flexion    Knee extension    Ankle dorsiflexion    Ankle plantarflexion    Ankle inversion    Ankle eversion     (Blank rows = not tested)  LOWER EXTREMITY MMT:    MMT Right Eval Left Eval  Hip flexion 4 4  Hip extension    Hip abduction 4 4  Hip adduction    Hip internal rotation    Hip external rotation    Knee flexion 4 4  Knee extension 4 4  Ankle dorsiflexion 4 4  Ankle plantarflexion    Ankle inversion    Ankle eversion    (Blank rows = not tested)  BED MOBILITY:  Not tested  TRANSFERS: Sit to stand: CGA  Assistive device utilized: None     Stand to sit: CGA  Assistive device utilized: None     Chair to chair: CGA  Assistive device utilized: None       RAMP:  Not tested  CURB:  Not tested  STAIRS: Not tested GAIT: Findings: Gait Characteristics: WFL, step to pattern, decreased arm swing- Right, decreased arm swing- Left, decreased step length- Right, decreased step length- Left, decreased stance time- Right, decreased stance time- Left, decreased stride length, decreased hip/knee flexion- Right, decreased hip/knee flexion- Left, decreased ankle dorsiflexion- Right, decreased ankle dorsiflexion- Left, and shuffling, Distance walked: approx 200 feet, Assistive device utilized:None, and Level of assistance: CGA  FUNCTIONAL TESTS:  5 times sit to stand: 18.45 sec Timed up and  go (TUG): 22.86 w/o AD 6 minute walk test: To be assessed visit #2 10 meter walk test: 22.86 and 23.86 Berg Balance Scale:  Item Test date: 04/14/2024 Date:  Date:   Sitting  to standing 4. able to stand without using hands and stabilize independently Insert SmartPhrase OPRCBERGREEVAL Insert SmartPhrase OPRCBERGREEVAL  2. Standing unsupported 4. able to stand safely for 2 minutes    3. Sitting with back unsupported, feet  supported 4. able to sit safely and securely for 2 minutes    4. Standing to sitting 4. sits safely with minimal use of hands    5. Pivot transfer  4. able to transfer safely with minor use of hands    6. Standing unsupported with eyes closed 3. able to stand 10 seconds with supervision    7. Standing unsupported with feet together 4. able to place feet together independently and stand 1 minute safely    8. Reaching forward with outstretched arms while standing 3. can reach forward 12 cm (5 inches)    9. Pick up object from the floor from standing 3. able to pick up slipper but needs supervision    10. Turning to look behind over left and right shoulders while standing 2. turns sideways but only maintains balance    11. Turn 360 degrees 2. able to turn 360 degrees safely but slowly    12. Place alternate foot on step or stool while standing unsupported 3. able to stand independently and complete 8 steps in > 20 seconds     13. Standing unsupported one foot in front 3. able to place foot ahead independently and hold 30 seconds    14. Standing on one leg 1. tries to lift leg unable to hold 3 seconds but remains standing independently.      Total Score 44/56 Total Score:    Total Score:      PATIENT SURVEYS:  The Activities-Specific Balance Confidence (ABC) Scale 0% 10 20 30  40 50 60 70 80 90 100% No confidence<->completely confident  "How confident are you that you will not lose your balance or become unsteady when you . . .   Date tested 04/16/2025  Walk around the house  60%  2. Walk up or down stairs 100% w rails  3. Bend over and pick up a slipper from in front of a closet floor 80%  4. Reach for a small can off a shelf at eye level 100%  5. Stand on tip toes and reach for something above your head 80%  6. Stand on a chair and reach for something 10%  7. Sweep the floor 50%  8. Walk outside the house to a car parked in the driveway 90%  9. Get into or out of a car 100%  10. Walk across a parking lot to the mall 70%  11. Walk up or down a ramp 80% with rails  12. Walk in a crowded mall where people rapidly walk past you 40%  13. Are bumped into by people as you walk through the mall 0% avoid  14. Step onto or off of an escalator while you are holding onto the railing 0% n/a  15. Step onto or off an escalator while holding onto parcels such that you cannot hold onto the railing 100%  16. Walk outside on icy sidewalks 100%  Total: #/16 66.25  TREATMENT DATE: 04/16/2024  NMR:  Step tap onto 4 block- w/o UE support - x 20 reps Lateral step up/down on 4 block- attempted with just fingertip support for coordination/balance/strength- x 15 reps Fwd- advancing LE f(swing phase) toe position to heel strike position 3# x 20 reps each LE w 1UE support at support bar   Gait:   Walking over 4 obstacles (canes, pvc) in approx 8 feet distance- back and forth x 10 - to emphasize increased step length- wearing 3# AW then progressed to walking over 10 feet distance w/o obstacles - attempting to continue talking longer strides- Counting steps (approx 8 steps per 10 feet path) x several min.   Resistive gait approx 200 feet at end of session  Review of HEP          PATIENT EDUCATION: Education details: Proposed plan of care; Discussion of medical terminology including diagnosis of Extrapyramidal and movement disorder; explanation of  functional outcome testing. Person educated: Patient and Spouse Education method: Explanation Education comprehension: verbalized understanding  HOME EXERCISE PROGRAM: Access Code: [email or web address] URL: [email or web address] Date: 04/17/2024 Prepared by: Reyes London  Exercises - Standing Hip Flexion AROM  - 3 x weekly - 3 sets - 10 reps - Standing Hip Abduction with Counter Support  - 3 x weekly - 3 sets - 10 reps - Standing Hip Circles with Ankle Floats  - 1 x daily - 3 x weekly - 3 sets - 10 reps - Standing Hip Extension with Counter Support  - 3 x weekly - 3 sets - 10 reps  GOALS: Goals reviewed with patient? Yes  SHORT TERM GOALS: Target date: 05/27/2024  Pt will be independent with HEP in order to improve strength and balance in order to decrease fall risk and improve function at home.  Baseline: EVAL- No formal HEP in place Goal status: INITIAL   LONG TERM GOALS: Target date: 07/07/2024  1.  Patient will complete five times sit to stand test in < 15 seconds indicating an increased LE strength and improved balance. Baseline: 04/14/2024= 18.45 sec Goal status: INITIAL  2.  Patient will improve ABC score by 9 points to demonstrate statistically significant improvement in mobility and quality of life as it relates to their Confidence in his balance.  Baseline: EVAL- To be assessed visit #2 Goal status: INITIAL   3.  Patient will increase Berg Balance score by > 6 points to demonstrate decreased fall risk during functional activities. Baseline: EVAL= 44/56 Goal status: INITIAL   4.   Patient will reduce timed up and go to <11 seconds to reduce fall risk and demonstrate improved transfer/gait ability. Baseline: EVAL= 22.86 sec without AD Goal status: INITIAL  5.   Patient will increase 10 meter walk test to >1.41m/s as to improve gait speed for better community ambulation and to reduce fall risk. Baseline: EVAL = 0.43 m/s avg without UE support Goal status:  INITIAL  6.   Patient will increase six minute walk test distance > 150 feet for progression to community ambulator and improve gait ability Baseline: EVAL= To be assessed visit #2; 04/16/2024= 254 feet with SPC Goal status: INITIAL   ASSESSMENT:  CLINICAL IMPRESSION: Patient is a 73 y.o. male who was seen today for physical therapy treatment for Extrapyramidal and movement disorder. Further assessed physical ability including 6 min walk today- He presents with slow gait speed and increased shuffling limited his functional endurance. He will benefit from skilled PT interventions to assist with improving his gait efficiency  and quality. Initiated some hip strengthening to assist with advancing/lifting LE.  Pt presents with deficits in strength, gait and balance and will benefit from skilled PT services to address these deficits, improve balance, and decrease risk for future falls.    OBJECTIVE IMPAIRMENTS: Abnormal gait, cardiopulmonary status limiting activity, decreased activity tolerance, decreased balance, decreased coordination, decreased endurance, decreased knowledge of condition, decreased mobility, difficulty walking, decreased ROM, decreased strength, hypomobility, and pain.   ACTIVITY LIMITATIONS: carrying, lifting, bending, sitting, standing, squatting, stairs, and transfers  PARTICIPATION LIMITATIONS: meal prep, cleaning, laundry, driving, shopping, community activity, and yard work  PERSONAL FACTORS: 1-2 comorbidities: HTN, OA are also affecting patient's functional outcome.   REHAB POTENTIAL: Good  CLINICAL DECISION MAKING: Evolving/moderate complexity  EVALUATION COMPLEXITY: Moderate  PLAN:  PT FREQUENCY: 1-2x/week  PT DURATION: 12 weeks  PLANNED INTERVENTIONS: [id number]- PT Re-evaluation, 97750- Physical Performance Testing, 97110-Therapeutic exercises, 97530- Therapeutic activity, [id number]- Neuromuscular re-education, 97535- Self Care, [id number]- Manual therapy, [id number]- Gait  training, [phone]- Orthotic Initial, [phone]- Orthotic/Prosthetic subsequent, [phone]- Canalith repositioning, [id number]- Electrical stimulation (manual), [phone] (1-2 muscles), [id number] (3+ muscles)- Dry Needling, Patient/Family education, Balance training, Stair training, Taping, Joint mobilization, Spinal mobilization, Vestibular training, DME instructions, Cryotherapy, and Moist heat  PLAN FOR NEXT SESSION:   -Update HEP  -Balance training - static and dynamic -LE strengthening interventions   Reyes LOISE London, PT 04/21/2024, 9:46 AM

## 2024-04-27 ENCOUNTER — Ambulatory Visit

## 2024-04-29 ENCOUNTER — Ambulatory Visit

## 2024-04-29 DIAGNOSIS — R2681 Unsteadiness on feet: Secondary | ICD-10-CM

## 2024-04-29 DIAGNOSIS — R293 Abnormal posture: Secondary | ICD-10-CM

## 2024-04-29 DIAGNOSIS — R262 Difficulty in walking, not elsewhere classified: Secondary | ICD-10-CM

## 2024-04-29 DIAGNOSIS — M6281 Muscle weakness (generalized): Secondary | ICD-10-CM | POA: Diagnosis not present

## 2024-04-29 DIAGNOSIS — R278 Other lack of coordination: Secondary | ICD-10-CM

## 2024-04-29 NOTE — Therapy (Signed)
 OUTPATIENT PHYSICAL THERAPY NEURO TREATMENT   Patient Name: Pedro Swanson MRN: [id number] DOB:June 21, 1950, 73 y.o., male Today's Date: 04/30/2024   PCP: Dr. Charlie Forte REFERRING PROVIDER: Dr. Cara Lovelace  END OF SESSION:  PT End of Session - 04/29/24 1154     Visit Number 4    Number of Visits 24    Date for Recertification  07/07/24    Progress Note Due on Visit 10    PT Start Time 1148    PT Stop Time 1230    PT Time Calculation (min) 42 min    Equipment Utilized During Treatment Gait belt    Activity Tolerance Patient tolerated treatment well    Behavior During Therapy WFL for tasks assessed/performed             Past Medical History:  Diagnosis Date   Cataract    COVID    Hyperlipidemia    Hypertension    Palpitations    Shingles    Past Surgical History:  Procedure Laterality Date   CATARACT EXTRACTION Bilateral    Patient Active Problem List   Diagnosis Date Noted   Special screening, prostate cancer 09/13/2022   PE (physical exam), annual 07/26/2022   Bradycardia 01/24/2022   OSA (obstructive sleep apnea) 12/14/2020   Paroxysmal atrial fibrillation (HCC) 07/11/2020   Pain in joint of left shoulder 01/11/2020   Atherosclerosis of abdominal aorta 05/04/2019   Arteriosclerotic cardiovascular disease (ASCVD) 07/08/2018   Coronary artery disease involving native coronary artery of native heart 10/02/2017   Benign fibroma of prostate 01/18/2015   Hypercholesteremia 01/18/2015   BP (high blood pressure) 01/18/2015   Osteoarthrosis 01/18/2015   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 01/18/2015   Avitaminosis D 01/18/2015    ONSET DATE: 2-3 years- progressive  REFERRING DIAG:  R29.898 (ICD-10-CM) - Other symptoms and signs involving the musculoskeletal system  G25.9 (ICD-10-CM) - Extrapyramidal and movement disorder, unspecified    THERAPY DIAG:  Muscle weakness (generalized)  Other lack of coordination  Unsteadiness on feet  Abnormal  posture  Difficulty in walking, not elsewhere classified  Rationale for Evaluation and Treatment: Rehabilitation  SUBJECTIVE:                                                                                                                                                                                             SUBJECTIVE STATEMENT: From Today: Patient reports he did receive the hip injection last week with a couple of days of Bliss but pain has return- but different than before- Patient could really elaborate.    From EVAL: I have been dealing with some unsteadiness, some right  hip/groin pain. Been told over the years - I have polyneuropathy or even PSP. Wife reports increased overall shuffling.   Pt accompanied by: significant other- [name]  PERTINENT HISTORY: Referral came from Cardiologist who saw patient on 03/16/2024- weakness both legs with diagnosis of Extrapyramidal disease and movement disorder. Paitent has PMH significant for HTN, CAD, Paroxysmal atrial fibrillation, Mixed Hyperlipidemia, Bradycardia, Obstructive sleep apnea, Chronic renal insufficiency, Shortness of breath on exertion, Smoking history  PAIN:  Are you having pain? Yes: NPRS scale: 2-3/10 current; worst- 8-9/10 Pain location: R hip/groin pain- Pain description: sharp with movement Aggravating factors: active movement- getting up/down - in/out of car.  Relieving factors: rest  PRECAUTIONS: None  RED FLAGS: None   WEIGHT BEARING RESTRICTIONS: No  FALLS: Has patient fallen in last 6 months? No  LIVING ENVIRONMENT: Lives with: lives with their spouse Lives in: House/apartment Stairs: 1 step into home Has following equipment at home: Single point cane  PLOF: Independent- goes to [employer] 3x/week- stationary bike- 2-3 mi, leg press, squats, push up at wall.   PATIENT GOALS: Show me some activities to do at gym.   OBJECTIVE:  Note: Objective measures were completed at Evaluation unless otherwise  noted.  DIAGNOSTIC FINDINGS: MRI- Thoracic Spine- 02/13/2024 IMPRESSION: 1. No significant spinal canal or neural foraminal stenosis. 2. Small central disc protrusions at T4-5 and T6-7 indenting the ventral thecal sac without cord or nerve root impingement.  COGNITION: Overall cognitive status: Within functional limits for tasks assessed   SENSATION: Light touch: WFL- B LE  COORDINATION: Impaired heel to shin Bilaterally- delayed reaction  EDEMA:  None observed  MUSCLE TONE: WFL    POSTURE: rounded shoulders and forward head  LOWER EXTREMITY ROM:     Active  Right Eval Left Eval  Hip flexion    Hip extension    Hip abduction    Hip adduction    Hip internal rotation    Hip external rotation    Knee flexion    Knee extension    Ankle dorsiflexion    Ankle plantarflexion    Ankle inversion    Ankle eversion     (Blank rows = not tested)  LOWER EXTREMITY MMT:    MMT Right Eval Left Eval  Hip flexion 4 4  Hip extension    Hip abduction 4 4  Hip adduction    Hip internal rotation    Hip external rotation    Knee flexion 4 4  Knee extension 4 4  Ankle dorsiflexion 4 4  Ankle plantarflexion    Ankle inversion    Ankle eversion    (Blank rows = not tested)  BED MOBILITY:  Not tested  TRANSFERS: Sit to stand: CGA  Assistive device utilized: None     Stand to sit: CGA  Assistive device utilized: None     Chair to chair: CGA  Assistive device utilized: None       RAMP:  Not tested  CURB:  Not tested  STAIRS: Not tested GAIT: Findings: Gait Characteristics: WFL, step to pattern, decreased arm swing- Right, decreased arm swing- Left, decreased step length- Right, decreased step length- Left, decreased stance time- Right, decreased stance time- Left, decreased stride length, decreased hip/knee flexion- Right, decreased hip/knee flexion- Left, decreased ankle dorsiflexion- Right, decreased ankle dorsiflexion- Left, and shuffling, Distance walked:  approx 200 feet, Assistive device utilized:None, and Level of assistance: CGA  FUNCTIONAL TESTS:  5 times sit to stand: 18.45 sec Timed up and go (TUG): 22.86 w/o  AD 6 minute walk test: To be assessed visit #2 10 meter walk test: 22.86 and 23.86 Berg Balance Scale:  Item Test date: 04/14/2024 Date:  Date:   Sitting to standing 4. able to stand without using hands and stabilize independently Insert SmartPhrase OPRCBERGREEVAL Insert SmartPhrase OPRCBERGREEVAL  2. Standing unsupported 4. able to stand safely for 2 minutes    3. Sitting with back unsupported, feet  supported 4. able to sit safely and securely for 2 minutes    4. Standing to sitting 4. sits safely with minimal use of hands    5. Pivot transfer  4. able to transfer safely with minor use of hands    6. Standing unsupported with eyes closed 3. able to stand 10 seconds with supervision    7. Standing unsupported with feet together 4. able to place feet together independently and stand 1 minute safely    8. Reaching forward with outstretched arms while standing 3. can reach forward 12 cm (5 inches)    9. Pick up object from the floor from standing 3. able to pick up slipper but needs supervision    10. Turning to look behind over left and right shoulders while standing 2. turns sideways but only maintains balance    11. Turn 360 degrees 2. able to turn 360 degrees safely but slowly    12. Place alternate foot on step or stool while standing unsupported 3. able to stand independently and complete 8 steps in > 20 seconds     13. Standing unsupported one foot in front 3. able to place foot ahead independently and hold 30 seconds    14. Standing on one leg 1. tries to lift leg unable to hold 3 seconds but remains standing independently.      Total Score 44/56 Total Score:    Total Score:      PATIENT SURVEYS:  The Activities-Specific Balance Confidence (ABC) Scale 0% 10 20 30  40 50 60 70 80 90 100% No confidence<->completely confident   How confident are you that you will not lose your balance or become unsteady when you . . .   Date tested 04/16/2025  Walk around the house 60%  2. Walk up or down stairs 100% w rails  3. Bend over and pick up a slipper from in front of a closet floor 80%  4. Reach for a small can off a shelf at eye level 100%  5. Stand on tip toes and reach for something above your head 80%  6. Stand on a chair and reach for something 10%  7. Sweep the floor 50%  8. Walk outside the house to a car parked in the driveway 90%  9. Get into or out of a car 100%  10. Walk across a parking lot to the mall 70%  11. Walk up or down a ramp 80% with rails  12. Walk in a crowded mall where people rapidly walk past you 40%  13. Are bumped into by people as you walk through the mall 0% avoid  14. Step onto or off of an escalator while you are holding onto the railing 0% n/a  15. Step onto or off an escalator while holding onto parcels such that you cannot hold onto the railing 100%  16. Walk outside on icy sidewalks 100%  Total: #/16 66.25  TREATMENT DATE: 04/29/2024  NMR:  -Step tap onto 4 block- w/o UE support - x 20 reps (patient initially very unsteady requiring some UE support- able to improve with practice) - VC to perform slowly to focus on some SLS.  -Lateral step up/down on 4 block- attempted with just fingertip support for coordination/balance/strength- 2x 10 reps each side.   -Lateral step up/over 4 obstacles (1/2 foam rolls) in // bars - down/back x 10 without UE Support- Min VC for wider steps.   Gait Training:   Walking over 4 obstacles  (1/2 foam rolls)- in // bars down and back x 10 - to emphasize increased step height/length- wearing 4# AW then progressed to walking over approx 75 feet distance w/o obstacles - attempting to continue talking longer strides- x 2  trials (1st = 4 sec without VC and then 47 sec on 2nd with VC for step length   Resistive gait approx 200 feet x 2 trials at end of session with [employer] 3# AW focusing on increasing step height. If patient distracted by talking then he immediately shortened his steps to shuffle. If concentrated - improved gait quality.           PATIENT EDUCATION: Education details: Proposed plan of care; Discussion of medical terminology including diagnosis of Extrapyramidal and movement disorder; explanation of functional outcome testing. Person educated: Patient and Spouse Education method: Explanation Education comprehension: verbalized understanding  HOME EXERCISE PROGRAM: Access Code: [email or web address] URL: [email or web address] Date: 04/17/2024 Prepared by: Reyes London  Exercises - Standing Hip Flexion AROM  - 3 x weekly - 3 sets - 10 reps - Standing Hip Abduction with Counter Support  - 3 x weekly - 3 sets - 10 reps - Standing Hip Circles   - 1 x daily - 3 x weekly - 3 sets - 10 reps - Standing Hip Extension with Counter Support  - 3 x weekly - 3 sets - 10 reps  GOALS: Goals reviewed with patient? Yes  SHORT TERM GOALS: Target date: 05/27/2024  Pt will be independent with HEP in order to improve strength and balance in order to decrease fall risk and improve function at home.  Baseline: EVAL- No formal HEP in place Goal status: INITIAL   LONG TERM GOALS: Target date: 07/07/2024  1.  Patient will complete five times sit to stand test in < 15 seconds indicating an increased LE strength and improved balance. Baseline: 04/14/2024= 18.45 sec Goal status: INITIAL  2.  Patient will improve ABC score by 9 points to demonstrate statistically significant improvement in mobility and quality of life as it relates to their Confidence in his balance.  Baseline: EVAL- To be assessed visit #2 Goal status: INITIAL   3.  Patient will increase Berg Balance score by > 6 points to  demonstrate decreased fall risk during functional activities. Baseline: EVAL= 44/56 Goal status: INITIAL   4.   Patient will reduce timed up and go to <11 seconds to reduce fall risk and demonstrate improved transfer/gait ability. Baseline: EVAL= 22.86 sec without AD Goal status: INITIAL  5.   Patient will increase 10 meter walk test to >1.87m/s as to improve gait speed for better community ambulation and to reduce fall risk. Baseline: EVAL = 0.43 m/s avg without UE support Goal status: INITIAL  6.   Patient will increase six minute walk test distance > 150 feet for progression to community ambulator and improve gait ability Baseline: EVAL= To be assessed visit #2; 04/16/2024= 254 feet with  SPC Goal status: INITIAL   ASSESSMENT:  CLINICAL IMPRESSION: Patient is a 73 y.o. male who was seen today for physical therapy treatment for Extrapyramidal and movement disorder. He continues with festinating posture and and difficulty taking larger steps in gait. When compartmentalizing task- he is able to take good steps over obstacles and with VC able to lengthen step but the moment he loses focus (usually talking and walk) the shuffle returns. Will more dual task with gait to attempt to length the steps more automatically.  Pt will benefit from skilled PT services to address these deficits, improve balance, and decrease risk for future falls.    OBJECTIVE IMPAIRMENTS: Abnormal gait, cardiopulmonary status limiting activity, decreased activity tolerance, decreased balance, decreased coordination, decreased endurance, decreased knowledge of condition, decreased mobility, difficulty walking, decreased ROM, decreased strength, hypomobility, and pain.   ACTIVITY LIMITATIONS: carrying, lifting, bending, sitting, standing, squatting, stairs, and transfers  PARTICIPATION LIMITATIONS: meal prep, cleaning, laundry, driving, shopping, community activity, and yard work  PERSONAL FACTORS: 1-2 comorbidities:  HTN, OA are also affecting patient's functional outcome.   REHAB POTENTIAL: Good  CLINICAL DECISION MAKING: Evolving/moderate complexity  EVALUATION COMPLEXITY: Moderate  PLAN:  PT FREQUENCY: 1-2x/week  PT DURATION: 12 weeks  PLANNED INTERVENTIONS: [id number]- PT Re-evaluation, 97750- Physical Performance Testing, 97110-Therapeutic exercises, 97530- Therapeutic activity, [id number]- Neuromuscular re-education, 97535- Self Care, [id number]- Manual therapy, [id number]- Gait training, [phone]- Orthotic Initial, [phone]- Orthotic/Prosthetic subsequent, [phone]- Canalith repositioning, [id number]- Electrical stimulation (manual), [phone] (1-2 muscles), [id number] (3+ muscles)- Dry Needling, Patient/Family education, Balance training, Stair training, Taping, Joint mobilization, Spinal mobilization, Vestibular training, DME instructions, Cryotherapy, and Moist heat  PLAN FOR NEXT SESSION:  -Dynamic gait activities that promote normalizing step height/length -Progress HEP as appropriate.  -Balance training - static and dynamic -LE strengthening interventions   Reyes LOISE London, PT 04/30/2024, 9:58 AM

## 2024-04-29 NOTE — Therapy (Incomplete)
 OUTPATIENT PHYSICAL THERAPY NEURO TREATMENT   Patient Name: Pedro Swanson MRN: [id number] DOB:1950/10/16, 73 y.o., male Today's Date: 04/29/2024   PCP: Dr. Charlie Forte REFERRING PROVIDER: Dr. Cara Lovelace  END OF SESSION:      Past Medical History:  Diagnosis Date   Cataract    COVID    Hyperlipidemia    Hypertension    Palpitations    Shingles    Past Surgical History:  Procedure Laterality Date   CATARACT EXTRACTION Bilateral    Patient Active Problem List   Diagnosis Date Noted   Special screening, prostate cancer 09/13/2022   PE (physical exam), annual 07/26/2022   Bradycardia 01/24/2022   OSA (obstructive sleep apnea) 12/14/2020   Paroxysmal atrial fibrillation (HCC) 07/11/2020   Pain in joint of left shoulder 01/11/2020   Atherosclerosis of abdominal aorta 05/04/2019   Arteriosclerotic cardiovascular disease (ASCVD) 07/08/2018   Coronary artery disease involving native coronary artery of native heart 10/02/2017   Benign fibroma of prostate 01/18/2015   Hypercholesteremia 01/18/2015   BP (high blood pressure) 01/18/2015   Osteoarthrosis 01/18/2015   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  Rationale for Evaluation and Treatment: Rehabilitation  SUBJECTIVE:                                                                                                                                                                                             SUBJECTIVE STATEMENT: From Today: Patient reports going to have hip injection on Wednesday- states doing okay today.      From EVAL: I have been dealing with some unsteadiness, some right hip/groin pain. Been told over the  years - I have polyneuropathy or even PSP. Wife reports increased overall shuffling.   Pt accompanied by: significant other- [name]  PERTINENT HISTORY: Referral came from Cardiologist who saw patient on 03/16/2024- weakness both legs with diagnosis of Extrapyramidal disease and movement disorder. Paitent has PMH significant for HTN, CAD, Paroxysmal atrial fibrillation, Mixed Hyperlipidemia, Bradycardia, Obstructive sleep apnea, Chronic renal insufficiency, Shortness of breath on exertion, Smoking history  PAIN:  Are you having pain? Yes: NPRS scale: 2-3/10 current; worst- 8-9/10 Pain location: R hip/groin pain- Pain description: sharp with movement Aggravating factors: active movement- getting up/down - in/out of car.  Relieving factors: rest  PRECAUTIONS: None  RED FLAGS: None   WEIGHT BEARING RESTRICTIONS: No  FALLS: Has  patient fallen in last 6 months? No  LIVING ENVIRONMENT: Lives with: lives with their spouse Lives in: House/apartment Stairs: 1 step into home Has following equipment at home: Single point cane  PLOF: Independent- goes to [employer] 3x/week- stationary bike- 2-3 mi, leg press, squats, push up at wall.   PATIENT GOALS: Show me some activities to do at gym.   OBJECTIVE:  Note: Objective measures were completed at Evaluation unless otherwise noted.  DIAGNOSTIC FINDINGS: MRI- Thoracic Spine- 02/13/2024 IMPRESSION: 1. No significant spinal canal or neural foraminal stenosis. 2. Small central disc protrusions at T4-5 and T6-7 indenting the ventral thecal sac without cord or nerve root impingement.  COGNITION: Overall cognitive status: Within functional limits for tasks assessed   SENSATION: Light touch: WFL- B LE  COORDINATION: Impaired heel to shin Bilaterally- delayed reaction  EDEMA:  None observed  MUSCLE TONE: WFL    POSTURE: rounded shoulders and forward head  LOWER EXTREMITY ROM:     Active  Right Eval Left Eval  Hip flexion    Hip  extension    Hip abduction    Hip adduction    Hip internal rotation    Hip external rotation    Knee flexion    Knee extension    Ankle dorsiflexion    Ankle plantarflexion    Ankle inversion    Ankle eversion     (Blank rows = not tested)  LOWER EXTREMITY MMT:    MMT Right Eval Left Eval  Hip flexion 4 4  Hip extension    Hip abduction 4 4  Hip adduction    Hip internal rotation    Hip external rotation    Knee flexion 4 4  Knee extension 4 4  Ankle dorsiflexion 4 4  Ankle plantarflexion    Ankle inversion    Ankle eversion    (Blank rows = not tested)  BED MOBILITY:  Not tested  TRANSFERS: Sit to stand: CGA  Assistive device utilized: None     Stand to sit: CGA  Assistive device utilized: None     Chair to chair: CGA  Assistive device utilized: None       RAMP:  Not tested  CURB:  Not tested  STAIRS: Not tested GAIT: Findings: Gait Characteristics: WFL, step to pattern, decreased arm swing- Right, decreased arm swing- Left, decreased step length- Right, decreased step length- Left, decreased stance time- Right, decreased stance time- Left, decreased stride length, decreased hip/knee flexion- Right, decreased hip/knee flexion- Left, decreased ankle dorsiflexion- Right, decreased ankle dorsiflexion- Left, and shuffling, Distance walked: approx 200 feet, Assistive device utilized:None, and Level of assistance: CGA  FUNCTIONAL TESTS:  5 times sit to stand: 18.45 sec Timed up and go (TUG): 22.86 w/o AD 6 minute walk test: To be assessed visit #2 10 meter walk test: 22.86 and 23.86 Berg Balance Scale:  Item Test date: 04/14/2024 Date:  Date:   Sitting to standing 4. able to stand without using hands and stabilize independently Insert SmartPhrase OPRCBERGREEVAL Insert SmartPhrase OPRCBERGREEVAL  2. Standing unsupported 4. able to stand safely for 2 minutes    3. Sitting with back unsupported, feet  supported 4. able to sit safely and securely for 2 minutes     4. Standing to sitting 4. sits safely with minimal use of hands    5. Pivot transfer  4. able to transfer safely with minor use of hands    6. Standing unsupported with eyes closed 3. able to stand 10 seconds with  supervision    7. Standing unsupported with feet together 4. able to place feet together independently and stand 1 minute safely    8. Reaching forward with outstretched arms while standing 3. can reach forward 12 cm (5 inches)    9. Pick up object from the floor from standing 3. able to pick up slipper but needs supervision    10. Turning to look behind over left and right shoulders while standing 2. turns sideways but only maintains balance    11. Turn 360 degrees 2. able to turn 360 degrees safely but slowly    12. Place alternate foot on step or stool while standing unsupported 3. able to stand independently and complete 8 steps in > 20 seconds     13. Standing unsupported one foot in front 3. able to place foot ahead independently and hold 30 seconds    14. Standing on one leg 1. tries to lift leg unable to hold 3 seconds but remains standing independently.      Total Score 44/56 Total Score:    Total Score:      PATIENT SURVEYS:  The Activities-Specific Balance Confidence (ABC) Scale 0% 10 20 30  40 50 60 70 80 90 100% No confidence<->completely confident  How confident are you that you will not lose your balance or become unsteady when you . . .   Date tested 04/16/2025  Walk around the house 60%  2. Walk up or down stairs 100% w rails  3. Bend over and pick up a slipper from in front of a closet floor 80%  4. Reach for a small can off a shelf at eye level 100%  5. Stand on tip toes and reach for something above your head 80%  6. Stand on a chair and reach for something 10%  7. Sweep the floor 50%  8. Walk outside the house to a car parked in the driveway 90%  9. Get into or out of a car 100%  10. Walk across a parking lot to the mall 70%  11. Walk up or down a ramp  80% with rails  12. Walk in a crowded mall where people rapidly walk past you 40%  13. Are bumped into by people as you walk through the mall 0% avoid  14. Step onto or off of an escalator while you are holding onto the railing 0% n/a  15. Step onto or off an escalator while holding onto parcels such that you cannot hold onto the railing 100%  16. Walk outside on icy sidewalks 100%  Total: #/16 66.25                                                                                                                                 TREATMENT DATE: 04/21/2024  NMR:  -Step tap onto 4 block- w/o UE support - x 20 reps (patient initially very unsteady requiring some UE support- able to  improve with practice) - VC to perform slowly to focus on some SLS.  -Lateral step up/down on 4 block- attempted with just fingertip support for coordination/balance/strength- 2x 10 reps each side.   -Fwd- advancing LE (swing phase) from on toe position to heel strike position 3# x 20 reps each LE w 1UE support at support bar- Positioned marker for visual cues to ensure proper length of step which patient utilized.    Gait Training:  Walking over 4 obstacles (2canes, 2 pvc pipes)- approx 8 feet distance- down and back x 10 - to emphasize increased step height/length- wearing 3# AW then progressed to walking over 10 feet distance w/o obstacles - attempting to continue talking longer strides- Counting steps (approx 8 steps per 10 feet path) x several min.   Resistive gait approx 200 feet at end of session with [facility] 3# AW focusing on increasing step height. If patient distracted by talking then he immediately shortened his steps to shuffle. If concentrated - improved gait quality.   Self care/home management:  Review of HEP: including 10 reps of each of below HEP to ensure patient presents with good understanding. He was able to return proper demo of activities without issues.           PATIENT  EDUCATION: Education details: Proposed plan of care; Discussion of medical terminology including diagnosis of Extrapyramidal and movement disorder; explanation of functional outcome testing. Person educated: Patient and Spouse Education method: Explanation Education comprehension: verbalized understanding  HOME EXERCISE PROGRAM: Access Code: [email or web address] URL: [email or web address] Date: 04/17/2024 Prepared by: Reyes London  Exercises - Standing Hip Flexion AROM  - 3 x weekly - 3 sets - 10 reps - Standing Hip Abduction with Counter Support  - 3 x weekly - 3 sets - 10 reps - Standing Hip Circles   - 1 x daily - 3 x weekly - 3 sets - 10 reps - Standing Hip Extension with Counter Support  - 3 x weekly - 3 sets - 10 reps  GOALS: Goals reviewed with patient? Yes  SHORT TERM GOALS: Target date: 05/27/2024  Pt will be independent with HEP in order to improve strength and balance in order to decrease fall risk and improve function at home.  Baseline: EVAL- No formal HEP in place Goal status: INITIAL   LONG TERM GOALS: Target date: 07/07/2024  1.  Patient will complete five times sit to stand test in < 15 seconds indicating an increased LE strength and improved balance. Baseline: 04/14/2024= 18.45 sec Goal status: INITIAL  2.  Patient will improve ABC score by 9 points to demonstrate statistically significant improvement in mobility and quality of life as it relates to their Confidence in his balance.  Baseline: EVAL- To be assessed visit #2 Goal status: INITIAL   3.  Patient will increase Berg Balance score by > 6 points to demonstrate decreased fall risk during functional activities. Baseline: EVAL= 44/56 Goal status: INITIAL   4.   Patient will reduce timed up and go to <11 seconds to reduce fall risk and demonstrate improved transfer/gait ability. Baseline: EVAL= 22.86 sec without AD Goal status: INITIAL  5.   Patient will increase 10 meter walk test to >1.2m/s  as to improve gait speed for better community ambulation and to reduce fall risk. Baseline: EVAL = 0.43 m/s avg without UE support Goal status: INITIAL  6.   Patient will increase six minute walk test distance > 150 feet for progression to community ambulator and improve gait ability  Baseline: EVAL= To be assessed visit #2; 04/16/2024= 254 feet with SPC Goal status: INITIAL   ASSESSMENT:  CLINICAL IMPRESSION: Patient is a 73 y.o. male who was seen today for physical therapy treatment for Extrapyramidal and movement disorder.  He continues to have most trouble shuffling gait unless he really concentrates. He was able to perform all task with improved step height and length with functional activities today - some initial unsteadiness yet improved with practice. He responded well to visual cues for foot placement overall today.  Pt will benefit from skilled PT services to address these deficits, improve balance, and decrease risk for future falls.    OBJECTIVE IMPAIRMENTS: Abnormal gait, cardiopulmonary status limiting activity, decreased activity tolerance, decreased balance, decreased coordination, decreased endurance, decreased knowledge of condition, decreased mobility, difficulty walking, decreased ROM, decreased strength, hypomobility, and pain.   ACTIVITY LIMITATIONS: carrying, lifting, bending, sitting, standing, squatting, stairs, and transfers  PARTICIPATION LIMITATIONS: meal prep, cleaning, laundry, driving, shopping, community activity, and yard work  PERSONAL FACTORS: 1-2 comorbidities: HTN, OA are also affecting patient's functional outcome.   REHAB POTENTIAL: Good  CLINICAL DECISION MAKING: Evolving/moderate complexity  EVALUATION COMPLEXITY: Moderate  PLAN:  PT FREQUENCY: 1-2x/week  PT DURATION: 12 weeks  PLANNED INTERVENTIONS: [id number]- PT Re-evaluation, 97750- Physical Performance Testing, 97110-Therapeutic exercises, 97530- Therapeutic activity, [id number]- Neuromuscular  re-education, 97535- Self Care, [id number]- Manual therapy, [id number]- Gait training, [id number]- Orthotic Initial, [phone]- Orthotic/Prosthetic subsequent, [phone]- Canalith repositioning, [id number]- Electrical stimulation (manual), [phone] (1-2 muscles), [id number] (3+ muscles)- Dry Needling, Patient/Family education, Balance training, Stair training, Taping, Joint mobilization, Spinal mobilization, Vestibular training, DME instructions, Cryotherapy, and Moist heat  PLAN FOR NEXT SESSION:  -Dynamic gait activities that promote normalizing step height/length -Progress HEP as appropriate.  -Balance training - static and dynamic -LE strengthening interventions   Reyes LOISE London, PT 04/29/2024, 8:21 AM

## 2024-05-01 ENCOUNTER — Ambulatory Visit

## 2024-05-01 DIAGNOSIS — R293 Abnormal posture: Secondary | ICD-10-CM

## 2024-05-01 DIAGNOSIS — R262 Difficulty in walking, not elsewhere classified: Secondary | ICD-10-CM

## 2024-05-01 DIAGNOSIS — M6281 Muscle weakness (generalized): Secondary | ICD-10-CM

## 2024-05-01 DIAGNOSIS — R278 Other lack of coordination: Secondary | ICD-10-CM

## 2024-05-01 DIAGNOSIS — R2681 Unsteadiness on feet: Secondary | ICD-10-CM

## 2024-05-01 NOTE — Therapy (Signed)
 " OUTPATIENT PHYSICAL THERAPY NEURO TREATMENT   Patient Name: Pedro Swanson MRN: [id number] DOB:10-Apr-1951, 73 y.o., male Today's Date: 05/02/2024   PCP: Dr. Charlie Forte REFERRING PROVIDER: Dr. Cara Lovelace  END OF SESSION:  PT End of Session - 05/01/24 0909     Visit Number 5    Number of Visits 24    Date for Recertification  07/07/24    Progress Note Due on Visit 10    PT Start Time 0847    PT Stop Time 0932    PT Time Calculation (min) 45 min    Equipment Utilized During Treatment Gait belt    Activity Tolerance Patient tolerated treatment well    Behavior During Therapy [facility] for tasks assessed/performed             Past Medical History:  Diagnosis Date   Cataract    COVID    Hyperlipidemia    Hypertension    Palpitations    Shingles    Past Surgical History:  Procedure Laterality Date   CATARACT EXTRACTION Bilateral    Patient Active Problem List   Diagnosis Date Noted   Special screening, prostate cancer 09/13/2022   PE (physical exam), annual 07/26/2022   Bradycardia 01/24/2022   OSA (obstructive sleep apnea) 12/14/2020   Paroxysmal atrial fibrillation (HCC) 07/11/2020   Pain in joint of left shoulder 01/11/2020   Atherosclerosis of abdominal aorta 05/04/2019   Arteriosclerotic cardiovascular disease (ASCVD) 07/08/2018   Coronary artery disease involving native coronary artery of native heart 10/02/2017   Benign fibroma of prostate 01/18/2015   Hypercholesteremia 01/18/2015   BP (high blood pressure) 01/18/2015   Osteoarthrosis 01/18/2015   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 01/18/2015   Avitaminosis D 01/18/2015    ONSET DATE: 2-3 years- progressive  REFERRING DIAG:  R29.898 (ICD-10-CM) - Other symptoms and signs involving the musculoskeletal system  G25.9 (ICD-10-CM) - Extrapyramidal and movement disorder, unspecified    THERAPY DIAG:  Muscle weakness (generalized)  Other lack of coordination  Unsteadiness on feet  Abnormal  posture  Difficulty in walking, not elsewhere classified  Rationale for Evaluation and Treatment: Rehabilitation  SUBJECTIVE:                                                                                                                                                                                             SUBJECTIVE STATEMENT: From Today: Patient reports doing okay without any new issues. Still sore Right hip and still shuffling more than you want me to.    From EVAL: I have been dealing with some unsteadiness, some right hip/groin pain. Been told over the years -  I have polyneuropathy or even PSP. Wife reports increased overall shuffling.   Pt accompanied by: significant other- [name]  PERTINENT HISTORY: Referral came from Cardiologist who saw patient on 03/16/2024- weakness both legs with diagnosis of Extrapyramidal disease and movement disorder. Paitent has PMH significant for HTN, CAD, Paroxysmal atrial fibrillation, Mixed Hyperlipidemia, Bradycardia, Obstructive sleep apnea, Chronic renal insufficiency, Shortness of breath on exertion, Smoking history  PAIN:  Are you having pain? Yes: NPRS scale: 2-3/10 current; worst- 8-9/10 Pain location: R hip/groin pain- Pain description: sharp with movement Aggravating factors: active movement- getting up/down - in/out of car.  Relieving factors: rest  PRECAUTIONS: None  RED FLAGS: None   WEIGHT BEARING RESTRICTIONS: No  FALLS: Has patient fallen in last 6 months? No  LIVING ENVIRONMENT: Lives with: lives with their spouse Lives in: House/apartment Stairs: 1 step into home Has following equipment at home: Single point cane  PLOF: Independent- goes to [employer] 3x/week- stationary bike- 2-3 mi, leg press, squats, push up at wall.   PATIENT GOALS: Show me some activities to do at gym.   OBJECTIVE:  Note: Objective measures were completed at Evaluation unless otherwise noted.  DIAGNOSTIC FINDINGS: MRI- Thoracic Spine-  02/13/2024 IMPRESSION: 1. No significant spinal canal or neural foraminal stenosis. 2. Small central disc protrusions at T4-5 and T6-7 indenting the ventral thecal sac without cord or nerve root impingement.  COGNITION: Overall cognitive status: Within functional limits for tasks assessed   SENSATION: Light touch: WFL- B LE  COORDINATION: Impaired heel to shin Bilaterally- delayed reaction  EDEMA:  None observed  MUSCLE TONE: WFL    POSTURE: rounded shoulders and forward head  LOWER EXTREMITY ROM:     Active  Right Eval Left Eval  Hip flexion    Hip extension    Hip abduction    Hip adduction    Hip internal rotation    Hip external rotation    Knee flexion    Knee extension    Ankle dorsiflexion    Ankle plantarflexion    Ankle inversion    Ankle eversion     (Blank rows = not tested)  LOWER EXTREMITY MMT:    MMT Right Eval Left Eval  Hip flexion 4 4  Hip extension    Hip abduction 4 4  Hip adduction    Hip internal rotation    Hip external rotation    Knee flexion 4 4  Knee extension 4 4  Ankle dorsiflexion 4 4  Ankle plantarflexion    Ankle inversion    Ankle eversion    (Blank rows = not tested)  BED MOBILITY:  Not tested  TRANSFERS: Sit to stand: CGA  Assistive device utilized: None     Stand to sit: CGA  Assistive device utilized: None     Chair to chair: CGA  Assistive device utilized: None       RAMP:  Not tested  CURB:  Not tested  STAIRS: Not tested GAIT: Findings: Gait Characteristics: WFL, step to pattern, decreased arm swing- Right, decreased arm swing- Left, decreased step length- Right, decreased step length- Left, decreased stance time- Right, decreased stance time- Left, decreased stride length, decreased hip/knee flexion- Right, decreased hip/knee flexion- Left, decreased ankle dorsiflexion- Right, decreased ankle dorsiflexion- Left, and shuffling, Distance walked: approx 200 feet, Assistive device utilized:None, and  Level of assistance: CGA  FUNCTIONAL TESTS:  5 times sit to stand: 18.45 sec Timed up and go (TUG): 22.86 w/o AD 6 minute walk test: To be assessed  visit #2 10 meter walk test: 22.86 and 23.86 Berg Balance Scale:  Item Test date: 04/14/2024 Date:  Date:   Sitting to standing 4. able to stand without using hands and stabilize independently Insert SmartPhrase OPRCBERGREEVAL Insert SmartPhrase OPRCBERGREEVAL  2. Standing unsupported 4. able to stand safely for 2 minutes    3. Sitting with back unsupported, feet  supported 4. able to sit safely and securely for 2 minutes    4. Standing to sitting 4. sits safely with minimal use of hands    5. Pivot transfer  4. able to transfer safely with minor use of hands    6. Standing unsupported with eyes closed 3. able to stand 10 seconds with supervision    7. Standing unsupported with feet together 4. able to place feet together independently and stand 1 minute safely    8. Reaching forward with outstretched arms while standing 3. can reach forward 12 cm (5 inches)    9. Pick up object from the floor from standing 3. able to pick up slipper but needs supervision    10. Turning to look behind over left and right shoulders while standing 2. turns sideways but only maintains balance    11. Turn 360 degrees 2. able to turn 360 degrees safely but slowly    12. Place alternate foot on step or stool while standing unsupported 3. able to stand independently and complete 8 steps in > 20 seconds     13. Standing unsupported one foot in front 3. able to place foot ahead independently and hold 30 seconds    14. Standing on one leg 1. tries to lift leg unable to hold 3 seconds but remains standing independently.      Total Score 44/56 Total Score:    Total Score:      PATIENT SURVEYS:  The Activities-Specific Balance Confidence (ABC) Scale 0% 10 20 30  40 50 60 70 80 90 100% No confidence<->completely confident  How confident are you that you will not lose your  balance or become unsteady when you . . .   Date tested 04/16/2025  Walk around the house 60%  2. Walk up or down stairs 100% w rails  3. Bend over and pick up a slipper from in front of a closet floor 80%  4. Reach for a small can off a shelf at eye level 100%  5. Stand on tip toes and reach for something above your head 80%  6. Stand on a chair and reach for something 10%  7. Sweep the floor 50%  8. Walk outside the house to a car parked in the driveway 90%  9. Get into or out of a car 100%  10. Walk across a parking lot to the mall 70%  11. Walk up or down a ramp 80% with rails  12. Walk in a crowded mall where people rapidly walk past you 40%  13. Are bumped into by people as you walk through the mall 0% avoid  14. Step onto or off of an escalator while you are holding onto the railing 0% n/a  15. Step onto or off an escalator while holding onto parcels such that you cannot hold onto the railing 100%  16. Walk outside on icy sidewalks 100%  Total: #/16 66.25  TREATMENT DATE: 05/01/2024  NMR:  -Step up/down on/off 4 block- w min UE support - x 20 reps (patient initially very unsteady requiring some UE support- able to improve with practice) - VC to perform slowly to focus on some SLS. (3# AW each LE)  -Lateral step walk- matrix cable 12.5# x 5 (max VC for wider steps)   -Lateral step up/over 4 obstacles (1/2 foam rolls) with 3#AW  in // bars - down/back x 10 without UE Support- Min VC for wider steps.   Gait Training:   Walking over 4 obstacles  (1/2 foam rolls)- in // bars down and back x 10 - to emphasize increased step height/length- wearing 3# AW then progressed to walking over approx 75 feet distance w/o obstacles - attempting to continue talking longer strides- x 2 trials (1st = 4 sec without VC and then 47 sec on 2nd with VC for step length    Fwd gait in // bars 3# focusing on counting steps (approx 7 from one end to other then retro steps approx 200 feet x   Resistive gait with SPC, 3# AW x 180 feet - Focusing on increasing step length- patient continues to shuffle feet if distracted-able to provide VC to improve some but only limited time before back to shuffle/festinating gait.       PATIENT EDUCATION: Education details: Proposed plan of care; Discussion of medical terminology including diagnosis of Extrapyramidal and movement disorder; explanation of functional outcome testing. Person educated: Patient and Spouse Education method: Explanation Education comprehension: verbalized understanding  HOME EXERCISE PROGRAM: Access Code: [email or web address] URL: [email or web address] Date: 04/17/2024 Prepared by: Reyes London  Exercises - Standing Hip Flexion AROM  - 3 x weekly - 3 sets - 10 reps - Standing Hip Abduction with Counter Support  - 3 x weekly - 3 sets - 10 reps - Standing Hip Circles   - 1 x daily - 3 x weekly - 3 sets - 10 reps - Standing Hip Extension with Counter Support  - 3 x weekly - 3 sets - 10 reps  GOALS: Goals reviewed with patient? Yes  SHORT TERM GOALS: Target date: 05/27/2024  Pt will be independent with HEP in order to improve strength and balance in order to decrease fall risk and improve function at home.  Baseline: EVAL- No formal HEP in place Goal status: INITIAL   LONG TERM GOALS: Target date: 07/07/2024  1.  Patient will complete five times sit to stand test in < 15 seconds indicating an increased LE strength and improved balance. Baseline: 04/14/2024= 18.45 sec Goal status: INITIAL  2.  Patient will improve ABC score by 9 points to demonstrate statistically significant improvement in mobility and quality of life as it relates to their Confidence in his balance.  Baseline: EVAL- To be assessed visit #2 Goal status: INITIAL   3.  Patient will increase Berg Balance score by  > 6 points to demonstrate decreased fall risk during functional activities. Baseline: EVAL= 44/56 Goal status: INITIAL   4.   Patient will reduce timed up and go to <11 seconds to reduce fall risk and demonstrate improved transfer/gait ability. Baseline: EVAL= 22.86 sec without AD Goal status: INITIAL  5.   Patient will increase 10 meter walk test to >1.42m/s as to improve gait speed for better community ambulation and to reduce fall risk. Baseline: EVAL = 0.43 m/s avg without UE support Goal status: INITIAL  6.   Patient will increase six minute walk test distance >  150 feet for progression to community ambulator and improve gait ability Baseline: EVAL= To be assessed visit #2; 04/16/2024= 254 feet with SPC Goal status: INITIAL   ASSESSMENT:  CLINICAL IMPRESSION: Patient is a 73 y.o. male who was seen today for physical therapy treatment for Extrapyramidal and movement disorder.He was challenged with resistive side stepping and later with step ups- Constant VC to take a larger step. Will continue to focus on activities that facilitate increased step length/height with more dual task with gait to attempt to length the steps more automatically.  Pt will benefit from skilled PT services to address these deficits, improve balance, and decrease risk for future falls.    OBJECTIVE IMPAIRMENTS: Abnormal gait, cardiopulmonary status limiting activity, decreased activity tolerance, decreased balance, decreased coordination, decreased endurance, decreased knowledge of condition, decreased mobility, difficulty walking, decreased ROM, decreased strength, hypomobility, and pain.   ACTIVITY LIMITATIONS: carrying, lifting, bending, sitting, standing, squatting, stairs, and transfers  PARTICIPATION LIMITATIONS: meal prep, cleaning, laundry, driving, shopping, community activity, and yard work  PERSONAL FACTORS: 1-2 comorbidities: HTN, OA are also affecting patient's functional outcome.   REHAB  POTENTIAL: Good  CLINICAL DECISION MAKING: Evolving/moderate complexity  EVALUATION COMPLEXITY: Moderate  PLAN:  PT FREQUENCY: 1-2x/week  PT DURATION: 12 weeks  PLANNED INTERVENTIONS: 97164- PT Re-evaluation, 97750- Physical Performance Testing, 97110-Therapeutic exercises, 97530- Therapeutic activity, [id number]- Neuromuscular re-education, 97535- Self Care, [id number]- Manual therapy, [id number]- Gait training, [phone]- Orthotic Initial, [phone]- Orthotic/Prosthetic subsequent, [phone]- Canalith repositioning, [id number]- Electrical stimulation (manual), [phone] (1-2 muscles), [id number] (3+ muscles)- Dry Needling, Patient/Family education, Balance training, Stair training, Taping, Joint mobilization, Spinal mobilization, Vestibular training, DME instructions, Cryotherapy, and Moist heat  PLAN FOR NEXT SESSION:  -Dynamic gait activities that promote normalizing step height/length -Progress HEP as appropriate.  -Balance training - static and dynamic -LE strengthening interventions   Reyes LOISE London, PT 05/02/2024, 10:21 AM        "

## 2024-05-04 ENCOUNTER — Ambulatory Visit

## 2024-05-04 DIAGNOSIS — R278 Other lack of coordination: Secondary | ICD-10-CM

## 2024-05-04 DIAGNOSIS — R262 Difficulty in walking, not elsewhere classified: Secondary | ICD-10-CM

## 2024-05-04 DIAGNOSIS — M6281 Muscle weakness (generalized): Secondary | ICD-10-CM | POA: Diagnosis not present

## 2024-05-04 DIAGNOSIS — R2681 Unsteadiness on feet: Secondary | ICD-10-CM

## 2024-05-04 DIAGNOSIS — R293 Abnormal posture: Secondary | ICD-10-CM

## 2024-05-04 NOTE — Therapy (Unsigned)
 " OUTPATIENT PHYSICAL THERAPY NEURO TREATMENT   Patient Name: Pedro Swanson MRN: [id number] DOB:1950-05-31, 73 y.o., male Today's Date: 05/05/2024   PCP: Dr. Charlie Forte REFERRING PROVIDER: Dr. Cara Lovelace  END OF SESSION:  PT End of Session - 05/04/24 1147     Visit Number 6    Number of Visits 24    Date for Recertification  07/07/24    Progress Note Due on Visit 10    PT Start Time 1147    PT Stop Time 1231    PT Time Calculation (min) 44 min    Equipment Utilized During Treatment Gait belt    Activity Tolerance Patient tolerated treatment well    Behavior During Therapy [facility] for tasks assessed/performed             Past Medical History:  Diagnosis Date   Cataract    COVID    Hyperlipidemia    Hypertension    Palpitations    Shingles    Past Surgical History:  Procedure Laterality Date   CATARACT EXTRACTION Bilateral    Patient Active Problem List   Diagnosis Date Noted   Special screening, prostate cancer 09/13/2022   PE (physical exam), annual 07/26/2022   Bradycardia 01/24/2022   OSA (obstructive sleep apnea) 12/14/2020   Paroxysmal atrial fibrillation (HCC) 07/11/2020   Pain in joint of left shoulder 01/11/2020   Atherosclerosis of abdominal aorta 05/04/2019   Arteriosclerotic cardiovascular disease (ASCVD) 07/08/2018   Coronary artery disease involving native coronary artery of native heart 10/02/2017   Benign fibroma of prostate 01/18/2015   Hypercholesteremia 01/18/2015   BP (high blood pressure) 01/18/2015   Osteoarthrosis 01/18/2015   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 01/18/2015   Avitaminosis D 01/18/2015    ONSET DATE: 2-3 years- progressive  REFERRING DIAG:  R29.898 (ICD-10-CM) - Other symptoms and signs involving the musculoskeletal system  G25.9 (ICD-10-CM) - Extrapyramidal and movement disorder, unspecified    THERAPY DIAG:  Muscle weakness (generalized)  Other lack of coordination  Unsteadiness on feet  Abnormal  posture  Difficulty in walking, not elsewhere classified  Rationale for Evaluation and Treatment: Rehabilitation  SUBJECTIVE:                                                                                                                                                                                             SUBJECTIVE STATEMENT: From Today: Patient reports right hip very sore after last visit- states feeling it was from the resistance cable.  From EVAL: I have been dealing with some unsteadiness, some right hip/groin pain. Been told over the years - I have polyneuropathy or even  PSP. Wife reports increased overall shuffling.   Pt accompanied by: significant other- [name]  PERTINENT HISTORY: Referral came from Cardiologist who saw patient on 03/16/2024- weakness both legs with diagnosis of Extrapyramidal disease and movement disorder. Paitent has PMH significant for HTN, CAD, Paroxysmal atrial fibrillation, Mixed Hyperlipidemia, Bradycardia, Obstructive sleep apnea, Chronic renal insufficiency, Shortness of breath on exertion, Smoking history  PAIN:  Are you having pain? Yes: NPRS scale: 2-3/10 current; worst- 8-9/10 Pain location: R hip/groin pain- Pain description: sharp with movement Aggravating factors: active movement- getting up/down - in/out of car.  Relieving factors: rest  PRECAUTIONS: None  RED FLAGS: None   WEIGHT BEARING RESTRICTIONS: No  FALLS: Has patient fallen in last 6 months? No  LIVING ENVIRONMENT: Lives with: lives with their spouse Lives in: House/apartment Stairs: 1 step into home Has following equipment at home: Single point cane  PLOF: Independent- goes to [employer] 3x/week- stationary bike- 2-3 mi, leg press, squats, push up at wall.   PATIENT GOALS: Show me some activities to do at gym.   OBJECTIVE:  Note: Objective measures were completed at Evaluation unless otherwise noted.  DIAGNOSTIC FINDINGS: MRI- Thoracic Spine- 02/13/2024 IMPRESSION: 1.  No significant spinal canal or neural foraminal stenosis. 2. Small central disc protrusions at T4-5 and T6-7 indenting the ventral thecal sac without cord or nerve root impingement.  COGNITION: Overall cognitive status: Within functional limits for tasks assessed   SENSATION: Light touch: WFL- B LE  COORDINATION: Impaired heel to shin Bilaterally- delayed reaction  EDEMA:  None observed  MUSCLE TONE: WFL    POSTURE: rounded shoulders and forward head  LOWER EXTREMITY ROM:     Active  Right Eval Left Eval  Hip flexion    Hip extension    Hip abduction    Hip adduction    Hip internal rotation    Hip external rotation    Knee flexion    Knee extension    Ankle dorsiflexion    Ankle plantarflexion    Ankle inversion    Ankle eversion     (Blank rows = not tested)  LOWER EXTREMITY MMT:    MMT Right Eval Left Eval  Hip flexion 4 4  Hip extension    Hip abduction 4 4  Hip adduction    Hip internal rotation    Hip external rotation    Knee flexion 4 4  Knee extension 4 4  Ankle dorsiflexion 4 4  Ankle plantarflexion    Ankle inversion    Ankle eversion    (Blank rows = not tested)  BED MOBILITY:  Not tested  TRANSFERS: Sit to stand: CGA  Assistive device utilized: None     Stand to sit: CGA  Assistive device utilized: None     Chair to chair: CGA  Assistive device utilized: None       RAMP:  Not tested  CURB:  Not tested  STAIRS: Not tested GAIT: Findings: Gait Characteristics: WFL, step to pattern, decreased arm swing- Right, decreased arm swing- Left, decreased step length- Right, decreased step length- Left, decreased stance time- Right, decreased stance time- Left, decreased stride length, decreased hip/knee flexion- Right, decreased hip/knee flexion- Left, decreased ankle dorsiflexion- Right, decreased ankle dorsiflexion- Left, and shuffling, Distance walked: approx 200 feet, Assistive device utilized:None, and Level of assistance:  CGA  FUNCTIONAL TESTS:  5 times sit to stand: 18.45 sec Timed up and go (TUG): 22.86 w/o AD 6 minute walk test: To be assessed visit #2 10 meter walk  test: 22.86 and 23.86 Berg Balance Scale:  Item Test date: 04/14/2024 Date:  Date:   Sitting to standing 4. able to stand without using hands and stabilize independently Insert SmartPhrase OPRCBERGREEVAL Insert SmartPhrase OPRCBERGREEVAL  2. Standing unsupported 4. able to stand safely for 2 minutes    3. Sitting with back unsupported, feet  supported 4. able to sit safely and securely for 2 minutes    4. Standing to sitting 4. sits safely with minimal use of hands    5. Pivot transfer  4. able to transfer safely with minor use of hands    6. Standing unsupported with eyes closed 3. able to stand 10 seconds with supervision    7. Standing unsupported with feet together 4. able to place feet together independently and stand 1 minute safely    8. Reaching forward with outstretched arms while standing 3. can reach forward 12 cm (5 inches)    9. Pick up object from the floor from standing 3. able to pick up slipper but needs supervision    10. Turning to look behind over left and right shoulders while standing 2. turns sideways but only maintains balance    11. Turn 360 degrees 2. able to turn 360 degrees safely but slowly    12. Place alternate foot on step or stool while standing unsupported 3. able to stand independently and complete 8 steps in > 20 seconds     13. Standing unsupported one foot in front 3. able to place foot ahead independently and hold 30 seconds    14. Standing on one leg 1. tries to lift leg unable to hold 3 seconds but remains standing independently.      Total Score 44/56 Total Score:    Total Score:      PATIENT SURVEYS:  The Activities-Specific Balance Confidence (ABC) Scale 0% 10 20 30  40 50 60 70 80 90 100% No confidence<->completely confident  How confident are you that you will not lose your balance or become  unsteady when you . . .   Date tested 04/16/2025  Walk around the house 60%  2. Walk up or down stairs 100% w rails  3. Bend over and pick up a slipper from in front of a closet floor 80%  4. Reach for a small can off a shelf at eye level 100%  5. Stand on tip toes and reach for something above your head 80%  6. Stand on a chair and reach for something 10%  7. Sweep the floor 50%  8. Walk outside the house to a car parked in the driveway 90%  9. Get into or out of a car 100%  10. Walk across a parking lot to the mall 70%  11. Walk up or down a ramp 80% with rails  12. Walk in a crowded mall where people rapidly walk past you 40%  13. Are bumped into by people as you walk through the mall 0% avoid  14. Step onto or off of an escalator while you are holding onto the railing 0% n/a  15. Step onto or off an escalator while holding onto parcels such that you cannot hold onto the railing 100%  16. Walk outside on icy sidewalks 100%  Total: #/16 66.25  TREATMENT DATE: 05/01/2024  NMR:  -Step up/down on/off 4 block- w/o UE support - x 20 reps   - Step tap onto 2nd step without UE support x 20 reps     Blaze pod activity: positioned 2 pods on 2nd step; 2 on 1st step and 1 on floor- W/O UE support- step tap (random setting) - x 6  rounds of 1 min focusing on enhancing cognitive processing and agility, providing an unpredictable environment to simulate real-world scenarios, and fostering quick reactions and adaptability.   -Lateral step up/over 4 obstacles (1/2 foam rolls) with 3#AW  in // bars - down/back x 10 without UE Support- Min VC for wider steps.   Blaze pod activity: positioned 5 pods on floor and patient performed wide side stepping from 1 pod to another- exhibiting shuffling- festinating gait - increased verbal cues to take a wider step x 5 bouts  Gait  Training:   Walking over 4 obstacles  (pvc pipe and tape line)- i down and back x 10 - to emphasize increased step height/length         PATIENT EDUCATION: Education details: Proposed plan of care; Discussion of medical terminology including diagnosis of Extrapyramidal and movement disorder; explanation of functional outcome testing. Person educated: Patient and Spouse Education method: Explanation Education comprehension: verbalized understanding  HOME EXERCISE PROGRAM: Access Code: [email or web address] URL: [email or web address] Date: 04/17/2024 Prepared by: Reyes London  Exercises - Standing Hip Flexion AROM  - 3 x weekly - 3 sets - 10 reps - Standing Hip Abduction with Counter Support  - 3 x weekly - 3 sets - 10 reps - Standing Hip Circles   - 1 x daily - 3 x weekly - 3 sets - 10 reps - Standing Hip Extension with Counter Support  - 3 x weekly - 3 sets - 10 reps  GOALS: Goals reviewed with patient? Yes  SHORT TERM GOALS: Target date: 05/27/2024  Pt will be independent with HEP in order to improve strength and balance in order to decrease fall risk and improve function at home.  Baseline: EVAL- No formal HEP in place Goal status: INITIAL   LONG TERM GOALS: Target date: 07/07/2024  1.  Patient will complete five times sit to stand test in < 15 seconds indicating an increased LE strength and improved balance. Baseline: 04/14/2024= 18.45 sec Goal status: INITIAL  2.  Patient will improve ABC score by 9 points to demonstrate statistically significant improvement in mobility and quality of life as it relates to their Confidence in his balance.  Baseline: EVAL- To be assessed visit #2 Goal status: INITIAL   3.  Patient will increase Berg Balance score by > 6 points to demonstrate decreased fall risk during functional activities. Baseline: EVAL= 44/56 Goal status: INITIAL   4.   Patient will reduce timed up and go to <11 seconds to reduce fall risk and  demonstrate improved transfer/gait ability. Baseline: EVAL= 22.86 sec without AD Goal status: INITIAL  5.   Patient will increase 10 meter walk test to >1.6m/s as to improve gait speed for better community ambulation and to reduce fall risk. Baseline: EVAL = 0.43 m/s avg without UE support Goal status: INITIAL  6.   Patient will increase six minute walk test distance > 150 feet for progression to community ambulator and improve gait ability Baseline: EVAL= To be assessed visit #2; 04/16/2024= 254 feet with SPC Goal status: INITIAL   ASSESSMENT:  CLINICAL IMPRESSION: Patient is a 73 y.o. male who was  seen today for physical therapy treatment for Extrapyramidal and movement disorder. He continues to be challenged with balance activities- [employer], showcasing Some improved coordination, balance, and cognitive function. The incorporation of dual-tasking technology with color recognition and association with specific movements in Blaze Pods was strategically chosen to provide a dynamic training environment, enabling the patient to engage in simultaneous physical and cognitive tasks. He continues to demonstrate poor step length with shuffling gait with walking but able to negotiate improved step length overall.  Pt will benefit from skilled PT services to address these deficits, improve balance, and decrease risk for future falls.    OBJECTIVE IMPAIRMENTS: Abnormal gait, cardiopulmonary status limiting activity, decreased activity tolerance, decreased balance, decreased coordination, decreased endurance, decreased knowledge of condition, decreased mobility, difficulty walking, decreased ROM, decreased strength, hypomobility, and pain.   ACTIVITY LIMITATIONS: carrying, lifting, bending, sitting, standing, squatting, stairs, and transfers  PARTICIPATION LIMITATIONS: meal prep, cleaning, laundry, driving, shopping, community activity, and yard work  PERSONAL FACTORS: 1-2 comorbidities: HTN,  OA are also affecting patient's functional outcome.   REHAB POTENTIAL: Good  CLINICAL DECISION MAKING: Evolving/moderate complexity  EVALUATION COMPLEXITY: Moderate  PLAN:  PT FREQUENCY: 1-2x/week  PT DURATION: 12 weeks  PLANNED INTERVENTIONS: [id number]- PT Re-evaluation, 97750- Physical Performance Testing, 97110-Therapeutic exercises, 97530- Therapeutic activity, [id number]- Neuromuscular re-education, 97535- Self Care, [id number]- Manual therapy, [id number]- Gait training, [phone]- Orthotic Initial, [phone]- Orthotic/Prosthetic subsequent, [phone]- Canalith repositioning, [id number]- Electrical stimulation (manual), [phone] (1-2 muscles), [id number] (3+ muscles)- Dry Needling, Patient/Family education, Balance training, Stair training, Taping, Joint mobilization, Spinal mobilization, Vestibular training, DME instructions, Cryotherapy, and Moist heat  PLAN FOR NEXT SESSION:  -Dynamic gait activities that promote normalizing step height/length -Progress HEP as appropriate.  -Balance training - static and dynamic -LE strengthening interventions   Reyes LOISE London, PT 05/05/2024, 9:17 AM        "

## 2024-05-11 ENCOUNTER — Ambulatory Visit

## 2024-05-11 DIAGNOSIS — M6281 Muscle weakness (generalized): Secondary | ICD-10-CM | POA: Diagnosis not present

## 2024-05-11 DIAGNOSIS — R262 Difficulty in walking, not elsewhere classified: Secondary | ICD-10-CM

## 2024-05-11 DIAGNOSIS — R2681 Unsteadiness on feet: Secondary | ICD-10-CM

## 2024-05-11 DIAGNOSIS — R293 Abnormal posture: Secondary | ICD-10-CM

## 2024-05-11 DIAGNOSIS — R278 Other lack of coordination: Secondary | ICD-10-CM

## 2024-05-11 NOTE — Therapy (Signed)
 " OUTPATIENT PHYSICAL THERAPY NEURO TREATMENT   Patient Name: Pedro Swanson MRN: [id number] DOB:Sep 13, 1950, 73 y.o., male Today's Date: 05/11/2024   PCP: Dr. Charlie Forte REFERRING PROVIDER: Dr. Cara Lovelace  END OF SESSION:  PT End of Session - 05/11/24 1140     Visit Number 7    Number of Visits 24    Date for Recertification  07/07/24    Progress Note Due on Visit 10    PT Start Time 1140    PT Stop Time 1221    PT Time Calculation (min) 41 min    Equipment Utilized During Treatment Gait belt    Activity Tolerance Patient tolerated treatment well    Behavior During Therapy WFL for tasks assessed/performed              Past Medical History:  Diagnosis Date   Cataract    COVID    Hyperlipidemia    Hypertension    Palpitations    Shingles    Past Surgical History:  Procedure Laterality Date   CATARACT EXTRACTION Bilateral    Patient Active Problem List   Diagnosis Date Noted   Special screening, prostate cancer 09/13/2022   PE (physical exam), annual 07/26/2022   Bradycardia 01/24/2022   OSA (obstructive sleep apnea) 12/14/2020   Paroxysmal atrial fibrillation (HCC) 07/11/2020   Pain in joint of left shoulder 01/11/2020   Atherosclerosis of abdominal aorta 05/04/2019   Arteriosclerotic cardiovascular disease (ASCVD) 07/08/2018   Coronary artery disease involving native coronary artery of native heart 10/02/2017   Benign fibroma of prostate 01/18/2015   Hypercholesteremia 01/18/2015   BP (high blood pressure) 01/18/2015   Osteoarthrosis 01/18/2015   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 01/18/2015   Avitaminosis D 01/18/2015    ONSET DATE: 2-3 years- progressive  REFERRING DIAG:  R29.898 (ICD-10-CM) - Other symptoms and signs involving the musculoskeletal system  G25.9 (ICD-10-CM) - Extrapyramidal and movement disorder, unspecified    THERAPY DIAG:  Muscle weakness (generalized)  Other lack of coordination  Unsteadiness on feet  Abnormal  posture  Difficulty in walking, not elsewhere classified  Rationale for Evaluation and Treatment: Rehabilitation  SUBJECTIVE:                                                                                                                                                                                             SUBJECTIVE STATEMENT: From Today: Patient reports he is going to seek an orthopedic referral for his ongoing pain. He states hip hip was giving away earlier today. Requested gentle workout today.    From EVAL: I have been dealing with some unsteadiness, some right  hip/groin pain. Been told over the years - I have polyneuropathy or even PSP. Wife reports increased overall shuffling.   Pt accompanied by: significant other- [name]  PERTINENT HISTORY: Referral came from Cardiologist who saw patient on 03/16/2024- weakness both legs with diagnosis of Extrapyramidal disease and movement disorder. Paitent has PMH significant for HTN, CAD, Paroxysmal atrial fibrillation, Mixed Hyperlipidemia, Bradycardia, Obstructive sleep apnea, Chronic renal insufficiency, Shortness of breath on exertion, Smoking history  PAIN:  Are you having pain? Yes: NPRS scale: 2-3/10 current; worst- 8-9/10 Pain location: R hip/groin pain- Pain description: sharp with movement Aggravating factors: active movement- getting up/down - in/out of car.  Relieving factors: rest  PRECAUTIONS: None  RED FLAGS: None   WEIGHT BEARING RESTRICTIONS: No  FALLS: Has patient fallen in last 6 months? No  LIVING ENVIRONMENT: Lives with: lives with their spouse Lives in: House/apartment Stairs: 1 step into home Has following equipment at home: Single point cane  PLOF: Independent- goes to [employer] 3x/week- stationary bike- 2-3 mi, leg press, squats, push up at wall.   PATIENT GOALS: Show me some activities to do at gym.   OBJECTIVE:  Note: Objective measures were completed at Evaluation unless otherwise  noted.  DIAGNOSTIC FINDINGS: MRI- Thoracic Spine- 02/13/2024 IMPRESSION: 1. No significant spinal canal or neural foraminal stenosis. 2. Small central disc protrusions at T4-5 and T6-7 indenting the ventral thecal sac without cord or nerve root impingement.  COGNITION: Overall cognitive status: Within functional limits for tasks assessed   SENSATION: Light touch: WFL- B LE  COORDINATION: Impaired heel to shin Bilaterally- delayed reaction  EDEMA:  None observed  MUSCLE TONE: WFL    POSTURE: rounded shoulders and forward head  LOWER EXTREMITY ROM:     Active  Right Eval Left Eval  Hip flexion    Hip extension    Hip abduction    Hip adduction    Hip internal rotation    Hip external rotation    Knee flexion    Knee extension    Ankle dorsiflexion    Ankle plantarflexion    Ankle inversion    Ankle eversion     (Blank rows = not tested)  LOWER EXTREMITY MMT:    MMT Right Eval Left Eval  Hip flexion 4 4  Hip extension    Hip abduction 4 4  Hip adduction    Hip internal rotation    Hip external rotation    Knee flexion 4 4  Knee extension 4 4  Ankle dorsiflexion 4 4  Ankle plantarflexion    Ankle inversion    Ankle eversion    (Blank rows = not tested)  BED MOBILITY:  Not tested  TRANSFERS: Sit to stand: CGA  Assistive device utilized: None     Stand to sit: CGA  Assistive device utilized: None     Chair to chair: CGA  Assistive device utilized: None       RAMP:  Not tested  CURB:  Not tested  STAIRS: Not tested GAIT: Findings: Gait Characteristics: WFL, step to pattern, decreased arm swing- Right, decreased arm swing- Left, decreased step length- Right, decreased step length- Left, decreased stance time- Right, decreased stance time- Left, decreased stride length, decreased hip/knee flexion- Right, decreased hip/knee flexion- Left, decreased ankle dorsiflexion- Right, decreased ankle dorsiflexion- Left, and shuffling, Distance walked:  approx 200 feet, Assistive device utilized:None, and Level of assistance: CGA  FUNCTIONAL TESTS:  5 times sit to stand: 18.45 sec Timed up and go (TUG): 22.86 w/o  AD 6 minute walk test: To be assessed visit #2 10 meter walk test: 22.86 and 23.86 Berg Balance Scale:  Item Test date: 04/14/2024 Date:  Date:   Sitting to standing 4. able to stand without using hands and stabilize independently Insert SmartPhrase OPRCBERGREEVAL Insert SmartPhrase OPRCBERGREEVAL  2. Standing unsupported 4. able to stand safely for 2 minutes    3. Sitting with back unsupported, feet  supported 4. able to sit safely and securely for 2 minutes    4. Standing to sitting 4. sits safely with minimal use of hands    5. Pivot transfer  4. able to transfer safely with minor use of hands    6. Standing unsupported with eyes closed 3. able to stand 10 seconds with supervision    7. Standing unsupported with feet together 4. able to place feet together independently and stand 1 minute safely    8. Reaching forward with outstretched arms while standing 3. can reach forward 12 cm (5 inches)    9. Pick up object from the floor from standing 3. able to pick up slipper but needs supervision    10. Turning to look behind over left and right shoulders while standing 2. turns sideways but only maintains balance    11. Turn 360 degrees 2. able to turn 360 degrees safely but slowly    12. Place alternate foot on step or stool while standing unsupported 3. able to stand independently and complete 8 steps in > 20 seconds     13. Standing unsupported one foot in front 3. able to place foot ahead independently and hold 30 seconds    14. Standing on one leg 1. tries to lift leg unable to hold 3 seconds but remains standing independently.      Total Score 44/56 Total Score:    Total Score:      PATIENT SURVEYS:  The Activities-Specific Balance Confidence (ABC) Scale 0% 10 20 30  40 50 60 70 80 90 100% No confidence<->completely confident   How confident are you that you will not lose your balance or become unsteady when you . . .   Date tested 04/16/2025  Walk around the house 60%  2. Walk up or down stairs 100% w rails  3. Bend over and pick up a slipper from in front of a closet floor 80%  4. Reach for a small can off a shelf at eye level 100%  5. Stand on tip toes and reach for something above your head 80%  6. Stand on a chair and reach for something 10%  7. Sweep the floor 50%  8. Walk outside the house to a car parked in the driveway 90%  9. Get into or out of a car 100%  10. Walk across a parking lot to the mall 70%  11. Walk up or down a ramp 80% with rails  12. Walk in a crowded mall where people rapidly walk past you 40%  13. Are bumped into by people as you walk through the mall 0% avoid  14. Step onto or off of an escalator while you are holding onto the railing 0% n/a  15. Step onto or off an escalator while holding onto parcels such that you cannot hold onto the railing 100%  16. Walk outside on icy sidewalks 100%  Total: #/16 66.25  TREATMENT DATE: 05/11/2024   Therex:  - Seated knee ext- 3# 2 x 10  -Seated hip march 3# 2 x 10 reps -Seated Calf raises 3# 2 x 10 reps -Seated Hip ext (resistive) RTB 2 x 10 rep -Seated ham curls (RTB) - 2 x 10 reps  -Seated Hip abd (resisted) RTB 2 x 10 reps -Seated resistive toe raises RTB 2 x 10 reps -Seated Hip IR (ball squeeze) with foot separation- 2 x 10 reps -Seated hip ER (RTB) 2 x 10 reps        PATIENT EDUCATION: Education details: Proposed plan of care; Discussion of medical terminology including diagnosis of Extrapyramidal and movement disorder; explanation of functional outcome testing. Person educated: Patient and Spouse Education method: Explanation Education comprehension: verbalized understanding  HOME EXERCISE  PROGRAM: Access Code: [email or web address] URL: [email or web address] Date: 04/17/2024 Prepared by: Reyes London  Exercises - Standing Hip Flexion AROM  - 3 x weekly - 3 sets - 10 reps - Standing Hip Abduction with Counter Support  - 3 x weekly - 3 sets - 10 reps - Standing Hip Circles   - 1 x daily - 3 x weekly - 3 sets - 10 reps - Standing Hip Extension with Counter Support  - 3 x weekly - 3 sets - 10 reps  GOALS: Goals reviewed with patient? Yes  SHORT TERM GOALS: Target date: 05/27/2024  Pt will be independent with HEP in order to improve strength and balance in order to decrease fall risk and improve function at home.  Baseline: EVAL- No formal HEP in place Goal status: INITIAL   LONG TERM GOALS: Target date: 07/07/2024  1.  Patient will complete five times sit to stand test in < 15 seconds indicating an increased LE strength and improved balance. Baseline: 04/14/2024= 18.45 sec Goal status: INITIAL  2.  Patient will improve ABC score by 9 points to demonstrate statistically significant improvement in mobility and quality of life as it relates to their Confidence in his balance.  Baseline: EVAL- To be assessed visit #2 Goal status: INITIAL   3.  Patient will increase Berg Balance score by > 6 points to demonstrate decreased fall risk during functional activities. Baseline: EVAL= 44/56 Goal status: INITIAL   4.   Patient will reduce timed up and go to <11 seconds to reduce fall risk and demonstrate improved transfer/gait ability. Baseline: EVAL= 22.86 sec without AD Goal status: INITIAL  5.   Patient will increase 10 meter walk test to >1.73m/s as to improve gait speed for better community ambulation and to reduce fall risk. Baseline: EVAL = 0.43 m/s avg without UE support Goal status: INITIAL  6.   Patient will increase six minute walk test distance > 150 feet for progression to community ambulator and improve gait ability Baseline: EVAL= To be assessed visit  #2; 04/16/2024= 254 feet with SPC Goal status: INITIAL   ASSESSMENT:  CLINICAL IMPRESSION: Patient is a 73 y.o. male who was seen today for physical therapy treatment for Extrapyramidal and movement disorder. Treatment was limited to some gentle non-weight bearing therex as patient has seen complaining more of R hip pain and going to consult with Ortho. He performed all seated activities without increased pain today. Pt will benefit from skilled PT services to address these deficits, improve balance, and decrease risk for future falls.    OBJECTIVE IMPAIRMENTS: Abnormal gait, cardiopulmonary status limiting activity, decreased activity tolerance, decreased balance, decreased coordination, decreased endurance, decreased knowledge of condition, decreased mobility, difficulty walking, decreased  ROM, decreased strength, hypomobility, and pain.   ACTIVITY LIMITATIONS: carrying, lifting, bending, sitting, standing, squatting, stairs, and transfers  PARTICIPATION LIMITATIONS: meal prep, cleaning, laundry, driving, shopping, community activity, and yard work  PERSONAL FACTORS: 1-2 comorbidities: HTN, OA are also affecting patient's functional outcome.   REHAB POTENTIAL: Good  CLINICAL DECISION MAKING: Evolving/moderate complexity  EVALUATION COMPLEXITY: Moderate  PLAN:  PT FREQUENCY: 1-2x/week  PT DURATION: 12 weeks  PLANNED INTERVENTIONS: 97164- PT Re-evaluation, 97750- Physical Performance Testing, 97110-Therapeutic exercises, 97530- Therapeutic activity, [id number]- Neuromuscular re-education, 97535- Self Care, [id number]- Manual therapy, [id number]- Gait training, [phone]- Orthotic Initial, [phone]- Orthotic/Prosthetic subsequent, [phone]- Canalith repositioning, [id number]- Electrical stimulation (manual), [phone] (1-2 muscles), [id number] (3+ muscles)- Dry Needling, Patient/Family education, Balance training, Stair training, Taping, Joint mobilization, Spinal mobilization, Vestibular training, DME instructions,  Cryotherapy, and Moist heat  PLAN FOR NEXT SESSION:  -Dynamic gait activities that promote normalizing step height/length -Progress HEP as appropriate.  -Balance training - static and dynamic -LE strengthening interventions   Reyes LOISE London, PT 05/11/2024, 1:06 PM        "

## 2024-05-13 ENCOUNTER — Ambulatory Visit

## 2024-05-19 ENCOUNTER — Ambulatory Visit: Attending: Internal Medicine

## 2024-05-19 DIAGNOSIS — R278 Other lack of coordination: Secondary | ICD-10-CM | POA: Insufficient documentation

## 2024-05-19 DIAGNOSIS — R262 Difficulty in walking, not elsewhere classified: Secondary | ICD-10-CM | POA: Diagnosis present

## 2024-05-19 DIAGNOSIS — M6281 Muscle weakness (generalized): Secondary | ICD-10-CM | POA: Diagnosis present

## 2024-05-19 DIAGNOSIS — R293 Abnormal posture: Secondary | ICD-10-CM | POA: Insufficient documentation

## 2024-05-19 DIAGNOSIS — R2681 Unsteadiness on feet: Secondary | ICD-10-CM | POA: Diagnosis present

## 2024-05-19 NOTE — Therapy (Signed)
 " OUTPATIENT PHYSICAL THERAPY NEURO TREATMENT   Patient Name: Pedro Swanson MRN: [id number] DOB:[birth year], 74 y.o., male Today's Date: 05/19/2024   PCP: Dr. Charlie Forte REFERRING PROVIDER: Dr. Cara Lovelace  END OF SESSION:  PT End of Session - 05/19/24 1155     Visit Number 8    Number of Visits 24    Date for Recertification  07/07/24    Progress Note Due on Visit 10    PT Start Time 1148    PT Stop Time 1228    PT Time Calculation (min) 40 min    Equipment Utilized During Treatment Gait belt    Activity Tolerance Patient tolerated treatment well    Behavior During Therapy [facility] for tasks assessed/performed               Past Medical History:  Diagnosis Date   Cataract    COVID    Hyperlipidemia    Hypertension    Palpitations    Shingles    Past Surgical History:  Procedure Laterality Date   CATARACT EXTRACTION Bilateral    Patient Active Problem List   Diagnosis Date Noted   Special screening, prostate cancer 09/13/2022   PE (physical exam), annual 07/26/2022   Bradycardia 01/24/2022   OSA (obstructive sleep apnea) 12/14/2020   Paroxysmal atrial fibrillation (HCC) 07/11/2020   Pain in joint of left shoulder 01/11/2020   Atherosclerosis of abdominal aorta 05/04/2019   Arteriosclerotic cardiovascular disease (ASCVD) 07/08/2018   Coronary artery disease involving native coronary artery of native heart 10/02/2017   Benign fibroma of prostate 01/18/2015   Hypercholesteremia 01/18/2015   BP (high blood pressure) 01/18/2015   Osteoarthrosis 01/18/2015   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 01/18/2015   Avitaminosis D 01/18/2015    ONSET DATE: 2-3 years- progressive  REFERRING DIAG:  R29.898 (ICD-10-CM) - Other symptoms and signs involving the musculoskeletal system  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 I am thinking on taking a break from PT until I see ortho. My hip was good after the shot for about 2 days then back to  pain right after. I made it to beach but wasn't able to do much.     From EVAL: I have been dealing with some unsteadiness, some right hip/groin pain. Been told over the years - I have polyneuropathy or even PSP. Wife reports increased overall shuffling.   Pt accompanied by: significant other- [name]  PERTINENT HISTORY: Referral came from Cardiologist who saw patient on 03/16/2024- weakness both legs with diagnosis of Extrapyramidal disease and movement disorder. Paitent has PMH significant for HTN, CAD, Paroxysmal atrial fibrillation, Mixed Hyperlipidemia, Bradycardia, Obstructive sleep apnea, Chronic renal insufficiency, Shortness of breath on exertion, Smoking history  PAIN:  Are you having pain? Yes: NPRS scale: 2-3/10 current; worst- 8-9/10 Pain location: R hip/groin pain- Pain description: sharp with movement Aggravating factors: active movement- getting up/down - in/out of car.  Relieving factors: rest  PRECAUTIONS: None  RED FLAGS: None   WEIGHT BEARING RESTRICTIONS: No  FALLS: Has patient fallen in last 6 months? No  LIVING ENVIRONMENT: Lives with: lives with their spouse Lives in: House/apartment Stairs: 1 step into home Has following equipment at home: Single point cane  PLOF: Independent- goes to [employer] 3x/week- stationary bike- 2-3 mi, leg press, squats, push up at wall.   PATIENT GOALS: Show me some activities to do at gym.   OBJECTIVE:  Note:  Objective measures were completed at Evaluation unless otherwise noted.  DIAGNOSTIC FINDINGS: MRI- Thoracic Spine- 02/13/2024 IMPRESSION: 1. No significant spinal canal or neural foraminal stenosis. 2. Small central disc protrusions at T4-5 and T6-7 indenting the ventral thecal sac without cord or nerve root impingement.  COGNITION: Overall cognitive status: Within functional limits for tasks assessed   SENSATION: Light touch: WFL- B LE  COORDINATION: Impaired heel to shin Bilaterally- delayed reaction  EDEMA:  None observed  MUSCLE TONE: WFL    POSTURE: rounded shoulders and forward head  LOWER EXTREMITY ROM:     Active  Right Eval Left Eval  Hip flexion    Hip extension    Hip abduction    Hip adduction    Hip internal rotation    Hip external rotation    Knee flexion    Knee extension    Ankle dorsiflexion    Ankle plantarflexion    Ankle inversion    Ankle eversion     (Blank rows = not tested)  LOWER EXTREMITY MMT:    MMT Right Eval Left Eval  Hip flexion 4 4  Hip extension    Hip abduction 4 4  Hip adduction    Hip internal rotation    Hip external rotation    Knee flexion 4 4  Knee extension 4 4  Ankle dorsiflexion 4 4  Ankle plantarflexion    Ankle inversion    Ankle eversion    (Blank rows = not tested)  BED MOBILITY:  Not tested  TRANSFERS: Sit to stand: CGA  Assistive device utilized: None     Stand to sit: CGA  Assistive device utilized: None     Chair to chair: CGA  Assistive device utilized: None       RAMP:  Not tested  CURB:  Not tested  STAIRS: Not tested GAIT: Findings: Gait Characteristics: WFL, step to pattern, decreased arm swing- Right, decreased arm swing- Left, decreased step length- Right, decreased step length- Left, decreased stance time- Right, decreased stance time- Left, decreased stride length, decreased hip/knee flexion- Right, decreased hip/knee flexion- Left, decreased ankle dorsiflexion- Right,  decreased ankle dorsiflexion- Left, and shuffling, Distance walked:  approx 200 feet, Assistive device utilized:None, and Level of assistance: CGA  FUNCTIONAL TESTS:  5 times sit to stand: 18.45 sec Timed up and go (TUG): 22.86 w/o AD 6 minute walk test: To be assessed visit #2 10 meter walk test: 22.86 and 23.86 Berg Balance Scale:  Item Test date: 04/14/2024 Date:  Date:   Sitting to standing 4. able to stand without using hands and stabilize independently Insert SmartPhrase OPRCBERGREEVAL Insert SmartPhrase OPRCBERGREEVAL  2. Standing unsupported 4. able to stand safely for 2 minutes    3. Sitting with back unsupported, feet  supported 4. able to sit safely and securely for 2 minutes    4. Standing to sitting 4. sits safely with minimal use of hands    5. Pivot transfer  4. able to transfer safely with minor use of hands    6. Standing unsupported with eyes closed 3. able to stand 10 seconds with supervision    7. Standing unsupported with feet together 4. able to place feet together independently and stand 1 minute safely    8. Reaching forward with outstretched arms while standing 3. can reach forward 12 cm (5 inches)    9. Pick up object from the floor from standing 3. able to pick up slipper but needs supervision    10. Turning to look behind over left and right shoulders while standing 2. turns sideways but only maintains balance    11. Turn 360 degrees 2. able to turn 360 degrees safely but slowly    12. Place alternate foot on step or stool while standing unsupported 3. able to stand independently and complete 8 steps in > 20 seconds     13. Standing unsupported one foot in front 3. able to place foot ahead independently and hold 30 seconds    14. Standing on one leg 1. tries to lift leg unable to hold 3 seconds but remains standing independently.      Total Score 44/56 Total Score:    Total Score:      PATIENT SURVEYS:  The Activities-Specific Balance Confidence (ABC) Scale 0%  10 20 30  40 50 60 70 80 90 100% No confidence<->completely confident  How confident are you that you will not lose your balance or become unsteady when you . . .   Date tested 04/16/2025  Walk around the house 60%  2. Walk up or down stairs 100% w rails  3. Bend over and pick up a slipper from in front of a closet floor 80%  4. Reach for a small can off a shelf at eye level 100%  5. Stand on tip toes and reach for something above your head 80%  6. Stand on a chair and reach for something 10%  7. Sweep the floor 50%  8. Walk outside the house to a car parked in the driveway 90%  9. Get into or out of a car 100%  10. Walk across a parking lot to the mall 70%  11. Walk up or down a ramp 80% with rails  12. Walk in a crowded mall where people rapidly walk past you 40%  13. Are bumped into by people as you walk through the mall 0% avoid  14. Step onto or off of an escalator while you are holding onto the railing 0% n/a  15. Step onto or off an escalator while holding onto parcels such that you cannot hold onto the railing 100%  16. Walk outside on icy sidewalks 100%  Total: #/16 66.25                                                                                                                                 TREATMENT DATE: 05/20/2023   Therex: - Seated knee ext- 2 x 10  -Seated hip march  GTB 2 x 10 reps -Seated knee ext (AROM) 2 x 10 reps -Seated Hip ext (resistive) RTB 2 x 10 rep --Seated Hip abd (resisted) RTB 2 x 10 reps -Seated Hip IR (ball squeeze) with foot separation- 2 x 10 reps -Seated hip ER (RTB) 2 x 10 reps        PATIENT EDUCATION: Education details: Proposed plan of care; Discussion of medical terminology including diagnosis of Extrapyramidal and movement disorder; explanation of functional outcome testing. Person educated: Patient and Spouse Education method: Explanation Education comprehension: verbalized understanding  HOME EXERCISE PROGRAM: Access Code:  [email or web address] URL: [email or web address] Date: 04/17/2024 Prepared by: Reyes London  Exercises - Standing Hip Flexion AROM  - 3 x weekly - 3 sets - 10 reps - Standing Hip Abduction with Counter Support  - 3 x weekly - 3 sets - 10 reps - Standing Hip Circles   - 1 x daily - 3 x weekly - 3 sets - 10 reps - Standing Hip Extension with Counter Support  - 3 x weekly - 3 sets - 10 reps  GOALS: Goals reviewed with patient? Yes  SHORT TERM GOALS: Target date: 05/27/2024  Pt will be independent with HEP in order to improve strength and balance in order to decrease fall risk and improve function at home.  Baseline: EVAL- No formal HEP in place Goal status: INITIAL   LONG TERM GOALS: Target date: 07/07/2024  1.  Patient will complete five times sit to stand test in < 15 seconds indicating an increased LE strength and improved balance. Baseline: 04/14/2024= 18.45 sec Goal status: INITIAL  2.  Patient will improve ABC score by 9 points to demonstrate statistically significant improvement in mobility and quality of life as it relates to their Confidence in his balance.  Baseline: EVAL- To be assessed visit #2 Goal status: INITIAL   3.  Patient will increase Berg Balance score by > 6 points to demonstrate decreased fall risk during functional activities. Baseline: EVAL= 44/56 Goal status: INITIAL   4.   Patient will reduce timed up and go to <11 seconds to reduce fall risk and demonstrate improved transfer/gait ability. Baseline: EVAL= 22.86 sec without AD Goal status: INITIAL  5.   Patient will increase 10 meter walk test to >1.81m/s as to improve gait speed for better community ambulation and to reduce fall risk. Baseline: EVAL = 0.43 m/s avg without UE support Goal status: INITIAL  6.   Patient will increase six minute walk test distance > 150 feet for progression to community ambulator and improve gait ability Baseline: EVAL= To be assessed visit #2; 04/16/2024= 254 feet  with  SPC Goal status: INITIAL   ASSESSMENT:  CLINICAL IMPRESSION: Patient is a 74 y.o. male who was seen today for physical therapy treatment for Extrapyramidal and movement disorder. Treatment was limited again due to patient reporting ongoing R hip pain. States he is going to hold PT visit until he is seen by Ortho as he believes he may have to undergo surgery in near future. Will cancel visits including later this week and following 2 week with plan for patient to return back week of 1/26 if appropriate. Instructed patient to call prior to that date to inform PT of plan if he is able to be seen by ortho prior to that date.     OBJECTIVE IMPAIRMENTS: Abnormal gait, cardiopulmonary status limiting activity, decreased activity tolerance, decreased balance, decreased coordination, decreased endurance, decreased knowledge of condition, decreased mobility, difficulty walking, decreased ROM, decreased strength, hypomobility, and pain.   ACTIVITY LIMITATIONS: carrying, lifting, bending, sitting, standing, squatting, stairs, and transfers  PARTICIPATION LIMITATIONS: meal prep, cleaning, laundry, driving, shopping, community activity, and yard work  PERSONAL FACTORS: 1-2 comorbidities: HTN, OA are also affecting patient's functional outcome.   REHAB POTENTIAL: Good  CLINICAL DECISION MAKING: Evolving/moderate complexity  EVALUATION COMPLEXITY: Moderate  PLAN:  PT FREQUENCY: 1-2x/week  PT DURATION: 12 weeks  PLANNED INTERVENTIONS: [id number]- PT Re-evaluation, 97750- Physical Performance Testing, 97110-Therapeutic exercises, 97530- Therapeutic activity, [id number]- Neuromuscular re-education, 97535- Self Care, [id number]- Manual therapy, [id number]- Gait training, [id number]- Orthotic Initial, [id number]- Orthotic/Prosthetic subsequent, [phone]- Canalith repositioning, [id number]- Electrical stimulation (manual), [phone] (1-2 muscles), [id number] (3+ muscles)- Dry Needling, Patient/Family education, Balance training, Stair training,  Taping, Joint mobilization, Spinal mobilization, Vestibular training, DME instructions, Cryotherapy, and Moist heat  PLAN FOR NEXT SESSION:   Hold for next 2.5 weeks to allow for patient to obtain ortho visit for ongoing R hip pain.  -Dynamic gait activities that promote normalizing step height/length -Progress HEP as appropriate.  -Balance training - static and dynamic -LE strengthening interventions   Reyes LOISE London, PT 05/19/2024, 3:08 PM        "

## 2024-05-21 ENCOUNTER — Ambulatory Visit

## 2024-05-25 ENCOUNTER — Ambulatory Visit

## 2024-05-27 ENCOUNTER — Ambulatory Visit

## 2024-06-02 ENCOUNTER — Ambulatory Visit

## 2024-06-04 ENCOUNTER — Ambulatory Visit

## 2024-06-09 ENCOUNTER — Ambulatory Visit

## 2024-06-11 ENCOUNTER — Ambulatory Visit

## 2024-06-11 NOTE — Therapy (Unsigned)
 " OUTPATIENT PHYSICAL THERAPY NEURO TREATMENT   Patient Name: Pedro Swanson MRN: [id number] DOB:11-05-1950, 74 y.o., male Today's Date: 06/11/2024   PCP: Dr. Charlie Forte REFERRING PROVIDER: Dr. Cara Lovelace  END OF SESSION:         Past Medical History:  Diagnosis Date   Cataract    COVID    Hyperlipidemia    Hypertension    Palpitations    Shingles    Past Surgical History:  Procedure Laterality Date   CATARACT EXTRACTION Bilateral    Patient Active Problem List   Diagnosis Date Noted   Special screening, prostate cancer 09/13/2022   PE (physical exam), annual 07/26/2022   Bradycardia 01/24/2022   OSA (obstructive sleep apnea) 12/14/2020   Paroxysmal atrial fibrillation (HCC) 07/11/2020   Pain in joint of left shoulder 01/11/2020   Atherosclerosis of abdominal aorta 05/04/2019   Arteriosclerotic cardiovascular disease (ASCVD) 07/08/2018   Coronary artery disease involving native coronary artery of native heart 10/02/2017   Benign fibroma of prostate 01/18/2015   Hypercholesteremia 01/18/2015   BP (high blood pressure) 01/18/2015   Osteoarthrosis 01/18/2015   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  Rationale for Evaluation and Treatment: Rehabilitation  SUBJECTIVE:                                                                                                                                                                                             SUBJECTIVE STATEMENT: From Today: I saw my PCP and she is going to refer me to orthopedics. I am thinking on taking a break from PT until I see ortho. My hip was good after the shot for about 2 days then back to pain right  after. I made it to beach but wasn't able to do much.     From EVAL: I have been dealing with some unsteadiness, some right hip/groin pain. Been told over the years - I have polyneuropathy or even PSP. Wife reports increased overall shuffling.   Pt accompanied by: significant other- [name]  PERTINENT HISTORY: Referral came from Cardiologist who saw patient on 03/16/2024- weakness both legs with diagnosis of Extrapyramidal disease and movement disorder. Paitent has PMH significant for HTN, CAD, Paroxysmal atrial fibrillation, Mixed Hyperlipidemia, Bradycardia, Obstructive sleep apnea, Chronic renal insufficiency, Shortness of breath on exertion, Smoking history  PAIN:  Are you having pain? Yes: NPRS scale:  2-3/10 current; worst- 8-9/10 Pain location: R hip/groin pain- Pain description: sharp with movement Aggravating factors: active movement- getting up/down - in/out of car.  Relieving factors: rest  PRECAUTIONS: None  RED FLAGS: None   WEIGHT BEARING RESTRICTIONS: No  FALLS: Has patient fallen in last 6 months? No  LIVING ENVIRONMENT: Lives with: lives with their spouse Lives in: House/apartment Stairs: 1 step into home Has following equipment at home: Single point cane  PLOF: Independent- goes to [employer] 3x/week- stationary bike- 2-3 mi, leg press, squats, push up at wall.   PATIENT GOALS: Show me some activities to do at gym.   OBJECTIVE:  Note: Objective measures were completed at Evaluation unless otherwise noted.  DIAGNOSTIC FINDINGS: MRI- Thoracic Spine- 02/13/2024 IMPRESSION: 1. No significant spinal canal or neural foraminal stenosis. 2. Small central disc protrusions at T4-5 and T6-7 indenting the ventral thecal sac without cord or nerve root impingement.  COGNITION: Overall cognitive status: Within functional limits for tasks assessed   SENSATION: Light touch: WFL- B LE  COORDINATION: Impaired heel to shin Bilaterally- delayed reaction  EDEMA:  None  observed  MUSCLE TONE: WFL    POSTURE: rounded shoulders and forward head  LOWER EXTREMITY ROM:     Active  Right Eval Left Eval  Hip flexion    Hip extension    Hip abduction    Hip adduction    Hip internal rotation    Hip external rotation    Knee flexion    Knee extension    Ankle dorsiflexion    Ankle plantarflexion    Ankle inversion    Ankle eversion     (Blank rows = not tested)  LOWER EXTREMITY MMT:    MMT Right Eval Left Eval  Hip flexion 4 4  Hip extension    Hip abduction 4 4  Hip adduction    Hip internal rotation    Hip external rotation    Knee flexion 4 4  Knee extension 4 4  Ankle dorsiflexion 4 4  Ankle plantarflexion    Ankle inversion    Ankle eversion    (Blank rows = not tested)  BED MOBILITY:  Not tested  TRANSFERS: Sit to stand: CGA  Assistive device utilized: None     Stand to sit: CGA  Assistive device utilized: None     Chair to chair: CGA  Assistive device utilized: None       RAMP:  Not tested  CURB:  Not tested  STAIRS: Not tested GAIT: Findings: Gait Characteristics: WFL, step to pattern, decreased arm swing- Right, decreased arm swing- Left, decreased step length- Right, decreased step length- Left, decreased stance time- Right, decreased stance time- Left, decreased stride length, decreased hip/knee flexion- Right, decreased hip/knee flexion- Left, decreased ankle dorsiflexion- Right, decreased ankle dorsiflexion- Left, and shuffling, Distance walked: approx 200 feet, Assistive device utilized:None, and Level of assistance: CGA  FUNCTIONAL TESTS:  5 times sit to stand: 18.45 sec Timed up and go (TUG): 22.86 w/o AD 6 minute walk test: To be assessed visit #2 10 meter walk test: 22.86 and 23.86 Berg Balance Scale:  Item Test date: 04/14/2024 Date:  Date:   Sitting to standing 4. able to stand without using hands and stabilize independently Insert SmartPhrase OPRCBERGREEVAL Insert SmartPhrase OPRCBERGREEVAL  2.  Standing unsupported 4. able to stand safely for 2 minutes    3. Sitting with back unsupported, feet  supported 4. able to sit safely and securely for 2 minutes    4.  Standing to sitting 4. sits safely with minimal use of hands    5. Pivot transfer  4. able to transfer safely with minor use of hands    6. Standing unsupported with eyes closed 3. able to stand 10 seconds with supervision    7. Standing unsupported with feet together 4. able to place feet together independently and stand 1 minute safely    8. Reaching forward with outstretched arms while standing 3. can reach forward 12 cm (5 inches)    9. Pick up object from the floor from standing 3. able to pick up slipper but needs supervision    10. Turning to look behind over left and right shoulders while standing 2. turns sideways but only maintains balance    11. Turn 360 degrees 2. able to turn 360 degrees safely but slowly    12. Place alternate foot on step or stool while standing unsupported 3. able to stand independently and complete 8 steps in > 20 seconds     13. Standing unsupported one foot in front 3. able to place foot ahead independently and hold 30 seconds    14. Standing on one leg 1. tries to lift leg unable to hold 3 seconds but remains standing independently.      Total Score 44/56 Total Score:    Total Score:      PATIENT SURVEYS:  The Activities-Specific Balance Confidence (ABC) Scale 0% 10 20 30  40 50 60 70 80 90 100% No confidence<->completely confident  How confident are you that you will not lose your balance or become unsteady when you . . .   Date tested 04/16/2025  Walk around the house 60%  2. Walk up or down stairs 100% w rails  3. Bend over and pick up a slipper from in front of a closet floor 80%  4. Reach for a small can off a shelf at eye level 100%  5. Stand on tip toes and reach for something above your head 80%  6. Stand on a chair and reach for something 10%  7. Sweep the floor 50%  8. Walk  outside the house to a car parked in the driveway 90%  9. Get into or out of a car 100%  10. Walk across a parking lot to the mall 70%  11. Walk up or down a ramp 80% with rails  12. Walk in a crowded mall where people rapidly walk past you 40%  13. Are bumped into by people as you walk through the mall 0% avoid  14. Step onto or off of an escalator while you are holding onto the railing 0% n/a  15. Step onto or off an escalator while holding onto parcels such that you cannot hold onto the railing 100%  16. Walk outside on icy sidewalks 100%  Total: #/16 66.25  TREATMENT DATE: 05/20/2023   Therex: - Seated knee ext- 2 x 10  -Seated hip march  GTB 2 x 10 reps -Seated knee ext (AROM) 2 x 10 reps -Seated Hip ext (resistive) RTB 2 x 10 rep --Seated Hip abd (resisted) RTB 2 x 10 reps -Seated Hip IR (ball squeeze) with foot separation- 2 x 10 reps -Seated hip ER (RTB) 2 x 10 reps        PATIENT EDUCATION: Education details: Proposed plan of care; Discussion of medical terminology including diagnosis of Extrapyramidal and movement disorder; explanation of functional outcome testing. Person educated: Patient and Spouse Education method: Explanation Education comprehension: verbalized understanding  HOME EXERCISE PROGRAM: Access Code: [email or web address] URL: [email or web address] Date: 04/17/2024 Prepared by: Reyes London  Exercises - Standing Hip Flexion AROM  - 3 x weekly - 3 sets - 10 reps - Standing Hip Abduction with Counter Support  - 3 x weekly - 3 sets - 10 reps - Standing Hip Circles   - 1 x daily - 3 x weekly - 3 sets - 10 reps - Standing Hip Extension with Counter Support  - 3 x weekly - 3 sets - 10 reps  GOALS: Goals reviewed with patient? Yes  SHORT TERM GOALS: Target date: 05/27/2024  Pt will be independent with HEP in order to  improve strength and balance in order to decrease fall risk and improve function at home.  Baseline: EVAL- No formal HEP in place Goal status: INITIAL   LONG TERM GOALS: Target date: 07/07/2024  1.  Patient will complete five times sit to stand test in < 15 seconds indicating an increased LE strength and improved balance. Baseline: 04/14/2024= 18.45 sec Goal status: INITIAL  2.  Patient will improve ABC score by 9 points to demonstrate statistically significant improvement in mobility and quality of life as it relates to their Confidence in his balance.  Baseline: EVAL- To be assessed visit #2 Goal status: INITIAL   3.  Patient will increase Berg Balance score by > 6 points to demonstrate decreased fall risk during functional activities. Baseline: EVAL= 44/56 Goal status: INITIAL   4.   Patient will reduce timed up and go to <11 seconds to reduce fall risk and demonstrate improved transfer/gait ability. Baseline: EVAL= 22.86 sec without AD Goal status: INITIAL  5.   Patient will increase 10 meter walk test to >1.35m/s as to improve gait speed for better community ambulation and to reduce fall risk. Baseline: EVAL = 0.43 m/s avg without UE support Goal status: INITIAL  6.   Patient will increase six minute walk test distance > 150 feet for progression to community ambulator and improve gait ability Baseline: EVAL= To be assessed visit #2; 04/16/2024= 254 feet with SPC Goal status: INITIAL   ASSESSMENT:  CLINICAL IMPRESSION: Patient is a 74 y.o. male who was seen today for physical therapy treatment for Extrapyramidal and movement disorder. Treatment was limited again due to patient reporting ongoing R hip pain. States he is going to hold PT visit until he is seen by Ortho as he believes he may have to undergo surgery in near future. Will cancel visits including later this week and following 2 week with plan for patient to return back week of 1/26 if appropriate. Instructed patient to  call prior to that date to inform PT of plan if he is able to be seen by ortho prior to that date.     OBJECTIVE IMPAIRMENTS: Abnormal gait, cardiopulmonary status limiting activity, decreased activity tolerance,  decreased balance, decreased coordination, decreased endurance, decreased knowledge of condition, decreased mobility, difficulty walking, decreased ROM, decreased strength, hypomobility, and pain.   ACTIVITY LIMITATIONS: carrying, lifting, bending, sitting, standing, squatting, stairs, and transfers  PARTICIPATION LIMITATIONS: meal prep, cleaning, laundry, driving, shopping, community activity, and yard work  PERSONAL FACTORS: 1-2 comorbidities: HTN, OA are also affecting patient's functional outcome.   REHAB POTENTIAL: Good  CLINICAL DECISION MAKING: Evolving/moderate complexity  EVALUATION COMPLEXITY: Moderate  PLAN:  PT FREQUENCY: 1-2x/week  PT DURATION: 12 weeks  PLANNED INTERVENTIONS: [id number]- PT Re-evaluation, 97750- Physical Performance Testing, 97110-Therapeutic exercises, 97530- Therapeutic activity, [id number]- Neuromuscular re-education, 97535- Self Care, [id number]- Manual therapy, [id number]- Gait training, [id number]- Orthotic Initial, [id number]- Orthotic/Prosthetic subsequent, [phone]- Canalith repositioning, [id number]- Electrical stimulation (manual), [phone] (1-2 muscles), [id number] (3+ muscles)- Dry Needling, Patient/Family education, Balance training, Stair training, Taping, Joint mobilization, Spinal mobilization, Vestibular training, DME instructions, Cryotherapy, and Moist heat  PLAN FOR NEXT SESSION:   Hold for next 2.5 weeks to allow for patient to obtain ortho visit for ongoing R hip pain.  -Dynamic gait activities that promote normalizing step height/length -Progress HEP as appropriate.  -Balance training - static and dynamic -LE strengthening interventions   Reyes LOISE London, PT 06/11/2024, 8:28 AM        "

## 2024-06-16 ENCOUNTER — Ambulatory Visit: Attending: Internal Medicine

## 2024-06-16 DIAGNOSIS — M6281 Muscle weakness (generalized): Secondary | ICD-10-CM

## 2024-06-16 DIAGNOSIS — R2681 Unsteadiness on feet: Secondary | ICD-10-CM

## 2024-06-16 DIAGNOSIS — R262 Difficulty in walking, not elsewhere classified: Secondary | ICD-10-CM

## 2024-06-16 DIAGNOSIS — R293 Abnormal posture: Secondary | ICD-10-CM

## 2024-06-16 DIAGNOSIS — R278 Other lack of coordination: Secondary | ICD-10-CM

## 2024-06-18 ENCOUNTER — Ambulatory Visit

## 2024-06-23 ENCOUNTER — Ambulatory Visit

## 2024-06-25 ENCOUNTER — Ambulatory Visit

## 2024-06-30 ENCOUNTER — Ambulatory Visit

## 2024-07-02 ENCOUNTER — Ambulatory Visit

## 2024-07-07 ENCOUNTER — Ambulatory Visit

## 2024-07-09 ENCOUNTER — Ambulatory Visit

## 2024-07-14 ENCOUNTER — Ambulatory Visit

## 2024-07-16 ENCOUNTER — Ambulatory Visit

## 2024-07-21 ENCOUNTER — Ambulatory Visit

## 2024-07-23 ENCOUNTER — Ambulatory Visit

## 2024-07-28 ENCOUNTER — Ambulatory Visit
